# Patient Record
Sex: Female | Born: 1990
Health system: Southern US, Community
[De-identification: ages and names within clinical notes are randomized; demographics above are authoritative.]

## PROBLEM LIST (undated history)

## (undated) ENCOUNTER — Inpatient Hospital Stay: Payer: Self-pay

## (undated) DIAGNOSIS — R519 Headache, unspecified: Secondary | ICD-10-CM

## (undated) DIAGNOSIS — K219 Gastro-esophageal reflux disease without esophagitis: Secondary | ICD-10-CM

## (undated) DIAGNOSIS — F319 Bipolar disorder, unspecified: Secondary | ICD-10-CM

## (undated) DIAGNOSIS — F419 Anxiety disorder, unspecified: Secondary | ICD-10-CM

## (undated) DIAGNOSIS — R51 Headache: Secondary | ICD-10-CM

## (undated) DIAGNOSIS — D649 Anemia, unspecified: Secondary | ICD-10-CM

## (undated) HISTORY — PX: APPENDECTOMY: SHX54

## (undated) HISTORY — PX: TUBAL LIGATION: SHX77

## (undated) HISTORY — PX: WISDOM TOOTH EXTRACTION: SHX21

## (undated) HISTORY — PX: ABDOMINAL HYSTERECTOMY: SHX81

---

## 2010-07-08 HISTORY — PX: LAPAROSCOPIC APPENDECTOMY: SUR753

## 2011-06-11 ENCOUNTER — Ambulatory Visit: Payer: Self-pay | Admitting: Surgery

## 2011-07-09 HISTORY — PX: OTHER SURGICAL HISTORY: SHX169

## 2011-10-13 ENCOUNTER — Emergency Department: Payer: Self-pay | Admitting: Emergency Medicine

## 2011-10-14 LAB — URINALYSIS, COMPLETE
Bilirubin,UR: NEGATIVE
Leukocyte Esterase: NEGATIVE
Nitrite: NEGATIVE
Ph: 6 (ref 4.5–8.0)
RBC,UR: 1 /HPF (ref 0–5)
Squamous Epithelial: 1

## 2012-12-07 ENCOUNTER — Emergency Department: Payer: Self-pay | Admitting: Emergency Medicine

## 2012-12-07 LAB — URINALYSIS, COMPLETE
Bilirubin,UR: NEGATIVE
Glucose,UR: NEGATIVE mg/dL (ref 0–75)
Ph: 6 (ref 4.5–8.0)
RBC,UR: 7 /HPF (ref 0–5)
Specific Gravity: 1.015 (ref 1.003–1.030)
Squamous Epithelial: 3
WBC UR: 3 /HPF (ref 0–5)

## 2012-12-07 LAB — COMPREHENSIVE METABOLIC PANEL
Alkaline Phosphatase: 66 U/L (ref 50–136)
Anion Gap: 7 (ref 7–16)
Chloride: 109 mmol/L — ABNORMAL HIGH (ref 98–107)
Co2: 24 mmol/L (ref 21–32)
EGFR (Non-African Amer.): >60
Glucose: 88 mg/dL (ref 65–99)
SGOT(AST): 16 U/L (ref 15–37)
SGPT (ALT): 19 U/L (ref 12–78)

## 2012-12-07 LAB — CBC
HCT: 38.5 % (ref 35.0–47.0)
HGB: 13 g/dL (ref 12.0–16.0)
MCH: 31.2 pg (ref 26.0–34.0)
MCV: 92 fL (ref 80–100)
RDW: 12.8 % (ref 11.5–14.5)
WBC: 10.8 10*3/uL (ref 3.6–11.0)

## 2012-12-07 LAB — LIPASE, BLOOD: Lipase: 66 U/L — ABNORMAL LOW (ref 73–393)

## 2016-04-17 LAB — OB RESULTS CONSOLE RUBELLA ANTIBODY, IGM: Rubella: IMMUNE

## 2016-04-17 LAB — OB RESULTS CONSOLE GC/CHLAMYDIA
Chlamydia: NEGATIVE
GC PROBE AMP, GENITAL: NEGATIVE

## 2016-04-17 LAB — OB RESULTS CONSOLE VARICELLA ZOSTER ANTIBODY, IGG: VARICELLA IGG: IMMUNE

## 2016-04-17 LAB — OB RESULTS CONSOLE HIV ANTIBODY (ROUTINE TESTING): HIV: NONREACTIVE

## 2016-04-17 LAB — OB RESULTS CONSOLE HEPATITIS B SURFACE ANTIGEN: Hepatitis B Surface Ag: NEGATIVE

## 2016-05-09 ENCOUNTER — Encounter: Payer: Self-pay | Admitting: *Deleted

## 2016-05-09 ENCOUNTER — Ambulatory Visit
Admission: RE | Admit: 2016-05-09 | Discharge: 2016-05-09 | Disposition: A | Discharge status: Home / Self Care | Payer: Medicaid Other | Source: Ambulatory Visit | Attending: Obstetrics and Gynecology | Admitting: Obstetrics and Gynecology

## 2016-05-09 VITALS — BP 105/57 | HR 88 | Temp 98.5°F | Wt 121.6 lb

## 2016-05-09 DIAGNOSIS — O09891 Supervision of other high risk pregnancies, first trimester: Secondary | ICD-10-CM

## 2016-05-09 NOTE — Progress Notes (Signed)
Referring physician:  [facility] 30 minute consultation  Adrienne West was referred to [facility] to discuss any possible risks to her fetus as a result of exposure to Lamictal.  This letter is a summary of our discussion and can be used as a reference as questions arise.  Prior to reviewing the specific exposures, we reviewed the general principles of teratogenic agents (substances that cause birth defects).  First, every pregnancy carries the risk for major or minor malformations of approximately 2-3%.  To show that an agent (exposure) is teratogenic, the rate of abnormalities for exposed pregnancies must be greater than that expected due to the background risk.  The dosage and timing of an exposure are also very important, with the first trimester of pregnancy often being the most critical.  Adrienne West has been prescribed Lamictal for bipolar disorder.  She stopped the medication two weeks prior to her last period (stopped 7/16, LMP 8/6) and has not felt the need to resume the medication.   She feels like she is doing well without medication and has a good support system with her family and therapist right now.  She is aware that if that changes, she can speak with her doctor about medication options during pregnancy.  We reviewed known information about this medication. Lamictal is thought to be a fairly safe medication to use in pregnancy.  While one pregnancy registry showed an increased in oral clefts following Lamictal use in pregnancy, this finding has not been confirmed in other, more recent studies.  There have been no other associations with specific birth defects, cognitive delays or other health concerns with this medication. If she is concerned about the risk for cleft lip and palate, then detailed ultrasound may be helpful to assess the structure of the nose/lips.  We reiterated that she stopped the medication approximately 4 weeks prior to conception, and  thus 6 weeks prior to implantation.  Given the half life of 29 hours for Lamictal, it is expected that the amount still in her system after 4-6 weeks would be incredibly small.  As with all medications prescribed during pregnancy, the risk to the fetus must be weighed against the benefit to the mother.  Therefore, it is important to for patients to consult their obstetrician and mental health provider with any concerns regarding stopping, changing doses, or continuing medication use.   During our session, we had the opportunity to obtain a detailed pregnancy and family history.  Adrienne West reported no pregnancy complications.  Prior to her missed period, she did smoke marijuana.  She was also smoking 20-25 cigarettes per day before pregnancy. She has slowly cut back and this week stopped smoking altogether.  We encouraged her to continue to avoid smoking, as it is known to be associated with low birth weight, preterm delivery and poor pregnancy outcomes. Lastly, Adrienne West is having dental issues which have or will require xrays, fillings and repair of broken teeth.  She has this scheduled in the second trimester, as recommended by her dentist.  In the family history, several family members were also stated to have mental health conditions including her mother and sister with bipolar disorder, a paternal uncle and his son who passed away due to suicide, and the father of the baby's mother with bipolar.  We reviewed that while we are not aware of the specific genetic factors which predispose to mental health conditions, there are clearly strong genetic influences in some families.  We  therefore encouraged Adrienne West to be mindful of this history and symptoms in her children.  Also in the family, Adrienne West reported one paternal first cousin with developmental differences and the father of the baby has a maternal half brother with autism.  Developmental delays and autism may both be present as isolated conditions or  can be part of many genetic syndromes.  In addition, both may be realted to environmental or non-genetic causes.  Without additional medical information, we cannot accurately assess the chance for recurrence for either of these conditions.  If more is learned, we are happy to discuss this further.  Lastly, the father of the baby's half sister has a son with craniosynostosis, or premature fusion of the bones in the skull.  Following surgery, he is doing well.  He has no developmental differences and no other known birth defects or physical differences to suggest a genetic syndrome.  Craniosynostosis can be an isolated finding with low chance for recurrence, or can be part of a genetic syndrome when other differences are present.  If Adrienne West finds out more about his condition, we are happy to review medical records or speak further. The remainder of the family history is unremarkable for recurrent pregnancy loss, deafness, blindness, consanguinity or known genetic conditions.  We reviewed her prior screening for chromosome conditions in this pregnancy.  [facility] which was normal for trisomy 18 and Down syndrome.  She declined SMA and cystic fibrosis in this pregnancy.    We appreciate being involved in the case of this patient are happy to speak with Adrienne West further should she have any questions.  We may be reached at [phone].     Cherly Andersoneborah F. Jermey Closs, MS, CGC

## 2016-05-15 NOTE — Progress Notes (Signed)
Deborah Wells, MS, CGC performed an integral service incident to the physician's initial service.  I was physically present in the clinical area and was immediately available to render assistance.   Cherish Runde C Mahamadou Weltz  

## 2016-07-08 NOTE — L&D Delivery Note (Signed)
Delivery Note At 3:21 AM a viable female was delivered via Vaginal, Spontaneous Delivery (Presentation: OA).  APGAR: 9/9; weight pending.   Placenta status: spontaneous, intact.  Cord: 3VC witout complications.  Cord pH: N/A  Anesthesia: none Episiotomy:  none Lacerations:  none Suture Repair: N/A Est. Blood Loss (mL):  300mL  Mom to postpartum.  Baby to Couplet care / Skin to Skin.  Vena Austriandreas Keene Gilkey 11/09/2016, 3:34 AM

## 2016-08-06 ENCOUNTER — Encounter: Payer: Self-pay | Admitting: *Deleted

## 2016-08-06 DIAGNOSIS — Z3A26 26 weeks gestation of pregnancy: Secondary | ICD-10-CM | POA: Insufficient documentation

## 2016-08-06 DIAGNOSIS — R197 Diarrhea, unspecified: Secondary | ICD-10-CM | POA: Diagnosis not present

## 2016-08-06 DIAGNOSIS — O26892 Other specified pregnancy related conditions, second trimester: Secondary | ICD-10-CM | POA: Diagnosis not present

## 2016-08-06 DIAGNOSIS — R51 Headache: Secondary | ICD-10-CM | POA: Insufficient documentation

## 2016-08-06 DIAGNOSIS — R0789 Other chest pain: Secondary | ICD-10-CM | POA: Diagnosis not present

## 2016-08-06 DIAGNOSIS — O99332 Smoking (tobacco) complicating pregnancy, second trimester: Secondary | ICD-10-CM | POA: Insufficient documentation

## 2016-08-06 DIAGNOSIS — Z79899 Other long term (current) drug therapy: Secondary | ICD-10-CM | POA: Insufficient documentation

## 2016-08-06 DIAGNOSIS — F17200 Nicotine dependence, unspecified, uncomplicated: Secondary | ICD-10-CM | POA: Diagnosis not present

## 2016-08-06 DIAGNOSIS — R110 Nausea: Secondary | ICD-10-CM | POA: Diagnosis not present

## 2016-08-06 LAB — CBC
HEMATOCRIT: 31.1 % — AB (ref 35.0–47.0)
HEMOGLOBIN: 10.9 g/dL — AB (ref 12.0–16.0)
MCH: 33.5 pg (ref 26.0–34.0)
MCHC: 35.2 g/dL (ref 32.0–36.0)
MCV: 95.2 fL (ref 80.0–100.0)
Platelets: 202 10*3/uL (ref 150–440)
RBC: 3.27 MIL/uL — ABNORMAL LOW (ref 3.80–5.20)
RDW: 12.8 % (ref 11.5–14.5)
WBC: 14.7 10*3/uL — AB (ref 3.6–11.0)

## 2016-08-06 LAB — BASIC METABOLIC PANEL
Anion gap: 6 (ref 5–15)
BUN: 5 mg/dL — AB (ref 6–20)
CHLORIDE: 110 mmol/L (ref 101–111)
CO2: 22 mmol/L (ref 22–32)
Calcium: 8.3 mg/dL — ABNORMAL LOW (ref 8.9–10.3)
Creatinine, Ser: 0.55 mg/dL (ref 0.44–1.00)
GFR calc Af Amer: >60 mL/min (ref >60)
Glucose, Bld: 103 mg/dL — ABNORMAL HIGH (ref 65–99)
POTASSIUM: 3.2 mmol/L — AB (ref 3.5–5.1)
SODIUM: 138 mmol/L (ref 135–145)

## 2016-08-06 LAB — TROPONIN I: Troponin I: <0.03 ng/mL (ref <0.03)

## 2016-08-06 NOTE — ED Triage Notes (Signed)
Pt reports chest pain since noon today.  Pt reports pain in center of chest.  Pain radiates into back and ribs.  Pt is [redacted] weeks pregnant.  [facility].  No abd pain.  No vag bleeding.  Pt saw dr yesterday for check up.  Pt also reports chills and cough.  Pt alert.

## 2016-08-07 ENCOUNTER — Emergency Department
Admission: EM | Admit: 2016-08-07 | Discharge: 2016-08-07 | Disposition: A | Discharge status: Home / Self Care | Payer: Medicaid Other | Attending: Emergency Medicine | Admitting: Emergency Medicine

## 2016-08-07 ENCOUNTER — Emergency Department: Payer: Medicaid Other

## 2016-08-07 ENCOUNTER — Encounter: Payer: Self-pay | Admitting: Emergency Medicine

## 2016-08-07 DIAGNOSIS — R079 Chest pain, unspecified: Secondary | ICD-10-CM

## 2016-08-07 HISTORY — DX: Bipolar disorder, unspecified: F31.9

## 2016-08-07 LAB — INFLUENZA PANEL BY PCR (TYPE A & B)
INFLAPCR: NEGATIVE
INFLBPCR: NEGATIVE

## 2016-08-07 MED ORDER — ACETAMINOPHEN 325 MG PO TABS
650.0000 mg | ORAL_TABLET | Freq: Once | ORAL | Status: AC
  Administered 2016-08-07: 650 mg via ORAL
  Filled 2016-08-07: qty 2

## 2016-08-07 MED ORDER — ALUM & MAG HYDROXIDE-SIMETH 200-200-20 MG/5ML PO SUSP
30.0000 mL | Freq: Once | ORAL | Status: AC
  Administered 2016-08-07: 30 mL via ORAL
  Filled 2016-08-07: qty 30

## 2016-08-07 NOTE — ED Notes (Addendum)
Pt. Verbalizes understanding of d/c instructions and follow-up. VS stable and pain controlled per pt.  Pt. In NAD at time of d/c and denies further concerns regarding this visit. Pt. Stable at the time of departure from the unit, departing unit by the safest and most appropriate manner per that pt condition and limitations. Pt advised to return to the ED at any time for emergent concerns, or for new/worsening symptoms.   

## 2016-08-07 NOTE — Discharge Instructions (Signed)
Please continue taking Tylenol for ear pain. He may take some Zantac at home as well. Please follow back up with Dr. Jean RosenthalJackson for further evaluation.

## 2016-08-07 NOTE — ED Provider Notes (Signed)
[facility] Emergency Department Provider Note   ____________________________________________   First MD Initiated Contact with Patient 08/07/16 0124     (approximate)  I have reviewed the triage vital signs and the nursing notes.   HISTORY  Chief Complaint Chest Pain    HPI Adrienne West is a 26 y.o. female who comes into the hospital today with some chest pain. The patient reports that started getting some flulike symptoms with fever, chills, headache and chest congestion. She reports that it started on Saturday and part of Sunday. She reports that she did have some nausea and diarrhea with no vomiting. The symptoms became better and the patient saw her primary care doctor on Monday. She had a prenatal appointment and was told everything was fine. Around noon and the patient's symptoms returned and she also had some chest pain. She reports it worsened throughout the day. It's the middle of her chest and sharp pain and she says it goes into her back. The patient took Tylenol but it did not help. The patient denies any shortness of breath or pain when she takes a deep breath. She rates her pain a 5 out of 10 in intensity. The patient is here today for evaluation of the symptoms.   Past Medical History:  Diagnosis Date  . Bipolar disorder [facility](HCC)     Patient Active Problem List   Diagnosis Date Noted  . Medication exposure during first trimester of pregnancy     Past Surgical History:  Procedure Laterality Date  . apendectomy N/A 2012    Prior to Admission medications   Medication Sig Start Date End Date Taking? Authorizing Provider  Prenatal Vit-Fe Fumarate-FA (MULTIVITAMIN-PRENATAL) 27-0.8 MG TABS tablet Take 1 tablet by mouth daily at 12 noon.   Yes Historical Provider, MD    Allergies Patient has no known allergies.  No family history on file.  Social History Social History  Substance Use Topics  . Smoking status: Current Every Day  Smoker    Packs/day: 1.00  . Smokeless tobacco: Former NeurosurgeonUser     Comment: Quit 3 days ago  . Alcohol use No    Review of Systems Constitutional:  fever/chills Eyes: No visual changes. ENT: No sore throat. Cardiovascular: chest pain. Respiratory: cough Gastrointestinal: diarrhea and nausea with No abdominal pain, no vomiting.   Genitourinary: Negative for dysuria. Musculoskeletal: Negative for back pain. Skin: Negative for rash. Neurological: Negative for headaches, focal weakness or numbness.  10-point ROS otherwise negative.  ____________________________________________   PHYSICAL EXAM:  VITAL SIGNS: ED Triage Vitals  Enc Vitals Group     BP 08/06/16 2154 (!) 113/59     Pulse Rate 08/06/16 2154 100     Resp 08/06/16 2154 20     Temp 08/06/16 2154 98.6 F (37 C)     Temp Source 08/06/16 2154 Oral     SpO2 08/06/16 2154 99 %     Weight 08/06/16 2155 130 lb (59 kg)     Height 08/06/16 2155 5\' 2"  (1.575 m)     Head Circumference --      Peak Flow --      Pain Score 08/06/16 2156 3     Pain Loc --      Pain Edu? --      Excl. in GC? --     Constitutional: Alert and oriented. Well appearing and in mild distress. Eyes: Conjunctivae are normal. PERRL. EOMI. Head: Atraumatic. Nose: No congestion/rhinnorhea. Mouth/Throat: Mucous membranes are moist.  Oropharynx non-erythematous. Cardiovascular: Normal rate, regular rhythm. Grossly normal heart sounds.  Good peripheral circulation. Respiratory: Normal respiratory effort.  No retractions. Lungs CTAB. Gastrointestinal: Soft and nontender. Gravid uterus. Positive bowel sounds Musculoskeletal: No lower extremity tenderness nor edema.  Neurologic:  Normal speech and language.  Skin:  Skin is warm, dry and intact.  Psychiatric: Mood and affect are normal.   ____________________________________________   LABS (all labs ordered are listed, but only abnormal results are displayed)  Labs Reviewed  BASIC METABOLIC PANEL -  Abnormal; Notable for the following:       Result Value   Potassium 3.2 (*)    Glucose, Bld 103 (*)    BUN 5 (*)    Calcium 8.3 (*)    All other components within normal limits  CBC - Abnormal; Notable for the following:    WBC 14.7 (*)    RBC 3.27 (*)    Hemoglobin 10.9 (*)    HCT 31.1 (*)    All other components within normal limits  TROPONIN I  INFLUENZA PANEL BY PCR (TYPE A & B)   ____________________________________________  EKG  ED ECG REPORT I, Rebecka Apley, the attending physician, personally viewed and interpreted this ECG.   Date: 08/06/2016  EKG Time: 2201  Rate: 109  Rhythm: sinus tachycardia  Axis: normal  Intervals:none  ST&T Change: none  ____________________________________________  RADIOLOGY  CXR ____________________________________________   PROCEDURES  Procedure(s) performed: None  Procedures  Critical Care performed: No  ____________________________________________   INITIAL IMPRESSION / ASSESSMENT AND PLAN / ED COURSE  Pertinent labs & imaging results that were available during my care of the patient were reviewed by me and considered in my medical decision making (see chart for details).  This is a 26 year old female who comes into the hospital today with some chest pain. She did have some flulike symptoms but they all did resolve. The patient had some pain that went into her back but she was not having any pleuritic pain. I did perform a chest x-ray and I did check some blood work. The patient's blood work is unremarkable. I gave the patient some Maalox as well as some Tylenol and she reports that her pain improved. At this time I don't feel that more studies need to be done. The patient feels improved and her blood work is unremarkable. I will have the patient follow up with her primary care physician. If any of the patient's symptoms return or worsen she should come back to the hospital for further evaluation.  Clinical Course  as of Aug 07 624  Wed Aug 07, 2016  0240 No acute cardiopulmonary process. DG Chest 2 View [AW]    Clinical Course User Index [AW] Rebecka Apley, MD     ____________________________________________   FINAL CLINICAL IMPRESSION(S) / ED DIAGNOSES  Final diagnoses:  Chest pain, unspecified type      NEW MEDICATIONS STARTED DURING THIS VISIT:  Discharge Medication List as of 08/07/2016  3:31 AM       Note:  This document was prepared using Dragon voice recognition software and may include unintentional dictation errors.    Rebecka Apley, MD 08/07/16 [phone]

## 2016-09-04 ENCOUNTER — Ambulatory Visit (INDEPENDENT_AMBULATORY_CARE_PROVIDER_SITE_OTHER): Payer: Medicaid Other | Admitting: Obstetrics and Gynecology

## 2016-09-04 ENCOUNTER — Encounter: Payer: Self-pay | Admitting: Obstetrics and Gynecology

## 2016-09-04 VITALS — BP 112/70 | Wt 134.0 lb

## 2016-09-04 DIAGNOSIS — Z3A30 30 weeks gestation of pregnancy: Secondary | ICD-10-CM

## 2016-09-04 DIAGNOSIS — Z3493 Encounter for supervision of normal pregnancy, unspecified, third trimester: Secondary | ICD-10-CM

## 2016-09-04 DIAGNOSIS — Z23 Encounter for immunization: Secondary | ICD-10-CM

## 2016-09-04 DIAGNOSIS — Z3403 Encounter for supervision of normal first pregnancy, third trimester: Secondary | ICD-10-CM | POA: Insufficient documentation

## 2016-09-04 NOTE — Progress Notes (Signed)
Pt has had a lot of HA's in the past couple weeks and seeing "flashes of light" with them.

## 2016-09-04 NOTE — Progress Notes (Signed)
TDaP today. Influenza vaccine last time.

## 2016-09-20 ENCOUNTER — Ambulatory Visit (INDEPENDENT_AMBULATORY_CARE_PROVIDER_SITE_OTHER): Payer: Medicaid Other | Admitting: Obstetrics and Gynecology

## 2016-09-20 VITALS — BP 100/60 | Wt 130.0 lb

## 2016-09-20 DIAGNOSIS — Z3A33 33 weeks gestation of pregnancy: Secondary | ICD-10-CM

## 2016-09-20 DIAGNOSIS — Z3403 Encounter for supervision of normal first pregnancy, third trimester: Secondary | ICD-10-CM

## 2016-09-20 NOTE — Progress Notes (Signed)
No vb. No lof. Desires cervical check prior to traveling.   Recent diagnosis of bacterial sinusitis. Taking amox.

## 2016-10-08 ENCOUNTER — Encounter: Payer: Self-pay | Admitting: Advanced Practice Midwife

## 2016-10-08 ENCOUNTER — Ambulatory Visit (INDEPENDENT_AMBULATORY_CARE_PROVIDER_SITE_OTHER): Payer: Medicaid Other | Admitting: Advanced Practice Midwife

## 2016-10-08 VITALS — BP 110/72 | Wt 138.0 lb

## 2016-10-08 DIAGNOSIS — Z3A35 35 weeks gestation of pregnancy: Secondary | ICD-10-CM

## 2016-10-08 NOTE — Progress Notes (Signed)
Doing well. Pt plans to formula feed due to her mental health. She will see her psychiatrist and get back on her medications as soon as possible after delivery. Encouraged sleep and support also for postpartum well being.

## 2016-10-16 ENCOUNTER — Ambulatory Visit (INDEPENDENT_AMBULATORY_CARE_PROVIDER_SITE_OTHER): Payer: Medicaid Other | Admitting: Advanced Practice Midwife

## 2016-10-16 VITALS — BP 100/60 | Wt 135.0 lb

## 2016-10-16 DIAGNOSIS — Z113 Encounter for screening for infections with a predominantly sexual mode of transmission: Secondary | ICD-10-CM

## 2016-10-16 DIAGNOSIS — Z3685 Encounter for antenatal screening for Streptococcus B: Secondary | ICD-10-CM

## 2016-10-16 DIAGNOSIS — Z3A36 36 weeks gestation of pregnancy: Secondary | ICD-10-CM

## 2016-10-16 NOTE — Progress Notes (Signed)
Doing well today. Reviewed labor precautions. GBS/aptima today.

## 2016-10-18 LAB — STREP GP B NAA: Strep Gp B NAA: NEGATIVE

## 2016-10-19 LAB — GC/CHLAMYDIA PROBE AMP
CHLAMYDIA, DNA PROBE: NEGATIVE
NEISSERIA GONORRHOEAE BY PCR: NEGATIVE

## 2016-10-23 ENCOUNTER — Ambulatory Visit (INDEPENDENT_AMBULATORY_CARE_PROVIDER_SITE_OTHER): Payer: Medicaid Other | Admitting: Obstetrics and Gynecology

## 2016-10-23 VITALS — BP 98/52 | Wt 140.0 lb

## 2016-10-23 DIAGNOSIS — Z3483 Encounter for supervision of other normal pregnancy, third trimester: Secondary | ICD-10-CM

## 2016-10-23 DIAGNOSIS — Z3A37 37 weeks gestation of pregnancy: Secondary | ICD-10-CM

## 2016-10-28 ENCOUNTER — Inpatient Hospital Stay
Admission: EM | Admit: 2016-10-28 | Discharge: 2016-10-29 | DRG: 780 | Disposition: A | Discharge status: Home / Self Care | Payer: Medicaid Other | Attending: Obstetrics and Gynecology | Admitting: Obstetrics and Gynecology

## 2016-10-28 DIAGNOSIS — Z79899 Other long term (current) drug therapy: Secondary | ICD-10-CM

## 2016-10-28 DIAGNOSIS — O99334 Smoking (tobacco) complicating childbirth: Secondary | ICD-10-CM | POA: Diagnosis present

## 2016-10-28 DIAGNOSIS — Z823 Family history of stroke: Secondary | ICD-10-CM

## 2016-10-28 DIAGNOSIS — F319 Bipolar disorder, unspecified: Secondary | ICD-10-CM | POA: Diagnosis present

## 2016-10-28 DIAGNOSIS — Z8249 Family history of ischemic heart disease and other diseases of the circulatory system: Secondary | ICD-10-CM

## 2016-10-28 DIAGNOSIS — Z9049 Acquired absence of other specified parts of digestive tract: Secondary | ICD-10-CM

## 2016-10-28 DIAGNOSIS — O99344 Other mental disorders complicating childbirth: Secondary | ICD-10-CM | POA: Diagnosis present

## 2016-10-28 DIAGNOSIS — O471 False labor at or after 37 completed weeks of gestation: Principal | ICD-10-CM | POA: Diagnosis present

## 2016-10-28 DIAGNOSIS — Z803 Family history of malignant neoplasm of breast: Secondary | ICD-10-CM

## 2016-10-28 DIAGNOSIS — O9902 Anemia complicating childbirth: Secondary | ICD-10-CM | POA: Diagnosis present

## 2016-10-28 DIAGNOSIS — Z825 Family history of asthma and other chronic lower respiratory diseases: Secondary | ICD-10-CM

## 2016-10-28 DIAGNOSIS — Z833 Family history of diabetes mellitus: Secondary | ICD-10-CM

## 2016-10-28 DIAGNOSIS — Z3A38 38 weeks gestation of pregnancy: Secondary | ICD-10-CM

## 2016-10-28 DIAGNOSIS — Z818 Family history of other mental and behavioral disorders: Secondary | ICD-10-CM

## 2016-10-28 DIAGNOSIS — F17210 Nicotine dependence, cigarettes, uncomplicated: Secondary | ICD-10-CM | POA: Diagnosis present

## 2016-10-28 HISTORY — DX: Anxiety disorder, unspecified: F41.9

## 2016-10-29 ENCOUNTER — Encounter: Payer: Self-pay | Admitting: Certified Nurse Midwife

## 2016-10-29 DIAGNOSIS — Z3A38 38 weeks gestation of pregnancy: Secondary | ICD-10-CM

## 2016-10-29 DIAGNOSIS — Z818 Family history of other mental and behavioral disorders: Secondary | ICD-10-CM | POA: Diagnosis not present

## 2016-10-29 DIAGNOSIS — O9902 Anemia complicating childbirth: Secondary | ICD-10-CM | POA: Diagnosis present

## 2016-10-29 DIAGNOSIS — O99334 Smoking (tobacco) complicating childbirth: Secondary | ICD-10-CM | POA: Diagnosis not present

## 2016-10-29 DIAGNOSIS — Z79899 Other long term (current) drug therapy: Secondary | ICD-10-CM | POA: Diagnosis not present

## 2016-10-29 DIAGNOSIS — O99344 Other mental disorders complicating childbirth: Secondary | ICD-10-CM | POA: Diagnosis present

## 2016-10-29 DIAGNOSIS — Z803 Family history of malignant neoplasm of breast: Secondary | ICD-10-CM | POA: Diagnosis not present

## 2016-10-29 DIAGNOSIS — F17210 Nicotine dependence, cigarettes, uncomplicated: Secondary | ICD-10-CM | POA: Diagnosis present

## 2016-10-29 DIAGNOSIS — Z833 Family history of diabetes mellitus: Secondary | ICD-10-CM | POA: Diagnosis not present

## 2016-10-29 DIAGNOSIS — Z8249 Family history of ischemic heart disease and other diseases of the circulatory system: Secondary | ICD-10-CM | POA: Diagnosis not present

## 2016-10-29 DIAGNOSIS — Z825 Family history of asthma and other chronic lower respiratory diseases: Secondary | ICD-10-CM | POA: Diagnosis not present

## 2016-10-29 DIAGNOSIS — O471 False labor at or after 37 completed weeks of gestation: Secondary | ICD-10-CM | POA: Diagnosis not present

## 2016-10-29 DIAGNOSIS — Z9049 Acquired absence of other specified parts of digestive tract: Secondary | ICD-10-CM | POA: Diagnosis not present

## 2016-10-29 DIAGNOSIS — F319 Bipolar disorder, unspecified: Secondary | ICD-10-CM | POA: Diagnosis present

## 2016-10-29 DIAGNOSIS — Z823 Family history of stroke: Secondary | ICD-10-CM | POA: Diagnosis not present

## 2016-10-29 LAB — CBC
HEMATOCRIT: 33.3 % — AB (ref 35.0–47.0)
HEMOGLOBIN: 11.2 g/dL — AB (ref 12.0–16.0)
MCH: 32.2 pg (ref 26.0–34.0)
MCHC: 33.7 g/dL (ref 32.0–36.0)
MCV: 95.6 fL (ref 80.0–100.0)
Platelets: 266 10*3/uL (ref 150–440)
RBC: 3.48 MIL/uL — ABNORMAL LOW (ref 3.80–5.20)
RDW: 13.2 % (ref 11.5–14.5)
WBC: 24 10*3/uL — ABNORMAL HIGH (ref 3.6–11.0)

## 2016-10-29 LAB — TYPE AND SCREEN
ABO/RH(D): O POS
Antibody Screen: NEGATIVE

## 2016-10-29 MED ORDER — OXYTOCIN 40 UNITS IN LACTATED RINGERS INFUSION - SIMPLE MED: 2.5000 [IU]/h | INTRAVENOUS | Status: DC

## 2016-10-29 MED ORDER — ONDANSETRON HCL 4 MG/2ML IJ SOLN: 4.0000 mg | Freq: Four times a day (QID) | INTRAMUSCULAR | Status: DC | PRN

## 2016-10-29 MED ORDER — LACTATED RINGERS IV SOLN: 500.0000 mL | INTRAVENOUS | Status: DC | PRN

## 2016-10-29 MED ORDER — BUTORPHANOL TARTRATE 1 MG/ML IJ SOLN: 1.0000 mg | INTRAMUSCULAR | Status: DC | PRN

## 2016-10-29 MED ORDER — LIDOCAINE HCL (PF) 1 % IJ SOLN
INTRAMUSCULAR | Status: AC
  Filled 2016-10-29: qty 30

## 2016-10-29 MED ORDER — MISOPROSTOL 200 MCG PO TABS
ORAL_TABLET | ORAL | Status: AC
  Filled 2016-10-29: qty 4

## 2016-10-29 MED ORDER — AMMONIA AROMATIC IN INHA
RESPIRATORY_TRACT | Status: AC
  Filled 2016-10-29: qty 10

## 2016-10-29 MED ORDER — AMMONIA AROMATIC IN INHA: 0.3000 mL | Freq: Once | RESPIRATORY_TRACT | Status: DC | PRN

## 2016-10-29 MED ORDER — OXYTOCIN 10 UNIT/ML IJ SOLN
INTRAMUSCULAR | Status: AC
  Filled 2016-10-29: qty 2

## 2016-10-29 MED ORDER — OXYTOCIN BOLUS FROM INFUSION
500.0000 mL | Freq: Once | INTRAVENOUS | Status: DC
Start: 2016-10-29 — End: 2016-10-29

## 2016-10-29 MED ORDER — MISOPROSTOL 200 MCG PO TABS: 800.0000 ug | ORAL_TABLET | Freq: Once | ORAL | Status: DC | PRN

## 2016-10-29 MED ORDER — LACTATED RINGERS IV SOLN
INTRAVENOUS | Status: DC
  Administered 2016-10-29: 1000 mL via INTRAVENOUS

## 2016-10-29 MED ORDER — LIDOCAINE HCL (PF) 1 % IJ SOLN: 30.0000 mL | INTRAMUSCULAR | Status: DC | PRN

## 2016-10-29 NOTE — H&P (Signed)
OB History & Physical   History of Present Illness:  Chief Complaint:  Complains of onset of regular contractions around 2130 last night. No LOF.  HPI:  Adrienne West is a 26 y.o. G2P1001 female with EDC= 11/08/2016  at [redacted]w[redacted]d dated by a 12 week ultrasound.  Her pregnancy has been complicated by bipolar disorder, fetal exposure to Lamictal early pregnancy, anemia, and tobacco use.. She also was diagnosed with hand, foot and mouth disease at 27 weeks of pregnancy.  Total weight gain this pregnancy is 18#. She also had no prenatal care x 2 months in last trimester. She presented to L&D for evaluation of labor. Her cervix changed from 2.5 to 4cm and she was admitted. +bloody show. Baby active  Pelvis proven to 6# (female infant 2013 at 47 weeks)  Prenatal care site: Prenatal care at [facility]. Desires to bottle feed. Will be resuming Lamictal postpartum. Contraception: vasectomy     Maternal Medical History:   Past Medical History:  Diagnosis Date  . Anxiety   . Bipolar disorder [facility])     Past Surgical History:  Procedure Laterality Date  . aspiration of breast cyst Right 2013  . LAPAROSCOPIC APPENDECTOMY N/A 2012    No Known Allergies  Prior to Admission medications   Medication Sig Start Date End Date Taking? Authorizing Provider  Prenatal Vit-Fe Fumarate-FA (MULTIVITAMIN-PRENATAL) 27-0.8 MG TABS tablet Take 1 tablet by mouth daily at 12 noon.   Yes Historical Provider, MD          Social History: She  reports that she has been smoking Cigarettes.  She has been smoking about 0.75 packs per day. She has quit using smokeless tobacco. She reports that she does not drink alcohol or use drugs.  Family History: family history includes AAA (abdominal aortic aneurysm) in her paternal grandfather; Bipolar disorder in her mother and sister; Breast cancer (age of onset: 18) in her mother; COPD in her maternal grandmother; Diabetes in her maternal grandfather; Endometriosis  in her mother; Hyperlipidemia in her father; Hypertension in her father; Hypothyroidism in her maternal grandmother; Rheum arthritis in her paternal grandmother; Stroke in her father and paternal grandfather.   Review of Systems: Negative x 10 systems reviewed except as noted in the HPI.      Physical Exam:  Vital Signs: BP 102/61   Pulse 81   Temp 98 F (36.7 C) (Oral)   LMP 02/11/2016  General: no acute distress.  HEENT: normocephalic, atraumatic Heart: regular rate & rhythm.  No murmurs/rubs/gallops Lungs: clear to auscultation bilaterally Abdomen: soft, gravid, non-tender;  EFW: 6 1/2#/ cephalic on Leopold's Pelvic:   External: Normal external female genitalia  Cervix: Dilation: 4 / Effacement (%): 60 / Station: -1   Extremities: non-tender, symmetric, no edema bilaterally.  DTRs: +1  Neurologic: Alert & oriented x 3.    Pertinent Results:  Prenatal Labs: Blood type/Rh O positive  Antibody screen negative  Rubella Varicella Immune immune  RPR Non reactive  HBsAg negative  HIV Non reactive  GC negative  Chlamydia negative  Genetic screening Negative First   1 hour GTT 96  3 hour GTT NA  GBS negative on 10/16/2016   Baseline FHR:  125-130 baseline with accelerations to 150s, moderate to marked variability Toco: contractions 1-5 min apart  Bedside Ultrasound:    Presentation: vertex  Placental Location: anterior  Assessment:  Adrienne West is a 26 y.o. G51P1001 female at [redacted]w[redacted]d in early labor FWB: Cat 1 tracing  Plan:  1. Admit to Labor & Delivery   2. CBC, T&S, Clrs, IVF 3. GBS negative.   4. Consents obtained. 5. Expectant management for now 6. Epidural when appropriate 7. O POS/ RI/ VI/ GBS negative 8. Bottle/?vasectomy 9. TDAP given 09/04/2016  Farrel Conners  10/29/2016 6:51 AM

## 2016-10-29 NOTE — Discharge Summary (Signed)
Physician Discharge Summary  Patient ID: Adrienne West MRN: [id number] DOB/AGE: 10/24/1990 25 y.o.  Admit date: 10/28/2016 Admitting provider: Nadara Mustard, MD Discharge date: 10/29/2016   Admission Diagnoses: early labor  Discharge Diagnoses: false labor  History of Present Illness: The patient is a 26 y.o. female G2P1001 at [redacted]w[redacted]d who presents for regular uterine contractions. She progressed to 4 cm. Based on her initial presentation she was diagnosed as being in active labor.  However, her contractions began to space out.  Hospital Course: She was admitted and observed for spontaneous cervical change. She was dilated to 4cm and stayed at that level for 6.5 hours.  She was rechecked and was 4 cm (at best) with a thick cervix.  She had reassuring fetal tracing and normal vitals.  Her WBC count was 24 at admission. However, no basis for augmentation could otherwise be found. A mutual decision was made to discharge the patient home to await further labor or spontaneous rupture of membranes.  Strict return precautions were given.     Past Medical History:  Diagnosis Date  . Anxiety   . Bipolar disorder [facility])     Past Surgical History:  Procedure Laterality Date  . aspiration of breast cyst Right 2013  . LAPAROSCOPIC APPENDECTOMY N/A 2012    No current facility-administered medications on file prior to encounter.    Current Outpatient Prescriptions on File Prior to Encounter  Medication Sig Dispense Refill  . Prenatal Vit-Fe Fumarate-FA (MULTIVITAMIN-PRENATAL) 27-0.8 MG TABS tablet Take 1 tablet by mouth daily at 12 noon.      No Known Allergies  Social History   Social History  . Marital status: Married    Spouse name: N/A  . Number of children: N/A  . Years of education: N/A   Occupational History  . Not on file.   Social History Main Topics  . Smoking status: Current Every Day Smoker    Packs/day: 0.75    Types: Cigarettes  . Smokeless tobacco: Former Neurosurgeon    Comment: Quit 3 days ago  . Alcohol use No  . Drug use: No  . Sexual activity: Yes   Other Topics Concern  . Not on file   Social History Narrative  . No narrative on file    Physical Exam: BP (!) 106/58   Pulse 64   Temp 98.2 F (36.8 C) (Oral)   Resp 16   Ht  (1.575 m)   Wt 140 lb (63.5 kg)   LMP 02/11/2016   BMI 25.61 kg/m   Physical Exam  Constitutional: She is oriented to person, place, and time. She appears well-developed and well-nourished. No distress.  HENT:  Head: Normocephalic and atraumatic.  Eyes: Conjunctivae are normal.  Neck: Normal range of motion. Neck supple. No thyromegaly present.  Cardiovascular: Normal rate, regular rhythm and normal heart sounds.  Exam reveals no gallop and no friction rub.   No murmur heard. Pulmonary/Chest: Effort normal and breath sounds normal. She has no wheezes.  Abdominal: Soft. She exhibits no distension. There is no tenderness. There is no guarding. Hernia confirmed negative in the right inguinal area and confirmed negative in the left inguinal area.  Gravid, nontender  Genitourinary: Pelvic exam was performed with patient supine. There is no rash, tenderness or lesion on the right labia. There is no rash, tenderness or lesion on the left labia.  Genitourinary Comments: Cervix: 4/30/-1, membranes intact, scant bloody show.  Musculoskeletal: Normal range of motion.  Lymphadenopathy:  Right: No inguinal adenopathy present.       Left: No inguinal adenopathy present.  Neurological: She is alert and oriented to person, place, and time.  Skin: Skin is warm and dry. No rash noted.  Psychiatric: She has a normal mood and affect. Her behavior is normal.     Consults: None  Significant Findings/ Diagnostic Studies:  CBC    Component Value Date/Time   WBC 24.0 (H) 10/29/2016 0413   RBC 3.48 (L) 10/29/2016 0413   HGB 11.2 (L) 10/29/2016 0413   HGB 13.0 12/07/2012 2047   HCT 33.3 (L) 10/29/2016 0413   HCT 38.5  12/07/2012 2047   PLT 266 10/29/2016 0413   PLT 308 12/07/2012 2047   MCV 95.6 10/29/2016 0413   MCV 92 12/07/2012 2047   MCH 32.2 10/29/2016 0413   MCHC 33.7 10/29/2016 0413   RDW 13.2 10/29/2016 0413   RDW 12.8 12/07/2012 2047    O POS   GBS: Negative (04/11 1438)   Procedures: NST Baseline FHR: 125 beats/min Variability: moderate Accelerations: present Decelerations: absent Tocometry: irregular   Interpretation:  INDICATIONS: early labor RESULTS:  A NST procedure was performed with FHR monitoring and a normal baseline established, appropriate time of 20-40 minutes of evaluation, and accels >2 seen w 15x15 characteristics.  Results show a REACTIVE NST.  Category 1.   Discharge Condition: stable  Disposition: 01-Home or Self Care  Diet: Regular diet  Discharge Activity: Activity as tolerated   Allergies as of 10/29/2016   No Known Allergies     Medication List    TAKE these medications   multivitamin-prenatal 27-0.8 MG Tabs tablet Take 1 tablet by mouth daily at 12 noon.      Follow-up Information    [facility] Follow up in 1 day(s).   Why:  Keep previously scheduled prenatal appointment Contact information: [address] [place] [address] [phone]          Total time spent taking care of this patient: 30 minutes  Signed: Thomasene Mohair, MD  10/29/2016, 10:09 AM

## 2016-10-30 ENCOUNTER — Ambulatory Visit (INDEPENDENT_AMBULATORY_CARE_PROVIDER_SITE_OTHER): Payer: Medicaid Other | Admitting: Advanced Practice Midwife

## 2016-10-30 VITALS — BP 90/60 | Wt 140.0 lb

## 2016-10-30 DIAGNOSIS — Z3A38 38 weeks gestation of pregnancy: Secondary | ICD-10-CM

## 2016-10-30 LAB — RPR: RPR Ser Ql: NONREACTIVE

## 2016-10-30 NOTE — Patient Instructions (Signed)

## 2016-10-30 NOTE — Progress Notes (Signed)
Doing well. Was seen in hospital on Tuesday and dilated from 2 to 4 but then stopped so made the decision to go home. Has only had one contraction today. Reviewed labor precautions and possibility of cervical sweep at [facility].

## 2016-11-04 ENCOUNTER — Encounter: Payer: Self-pay | Admitting: *Deleted

## 2016-11-04 ENCOUNTER — Observation Stay
Admission: EM | Admit: 2016-11-04 | Discharge: 2016-11-04 | Disposition: A | Discharge status: Home / Self Care | Payer: Medicaid Other | Attending: Obstetrics and Gynecology | Admitting: Obstetrics and Gynecology

## 2016-11-04 DIAGNOSIS — Z3A39 39 weeks gestation of pregnancy: Secondary | ICD-10-CM | POA: Diagnosis not present

## 2016-11-04 DIAGNOSIS — O99333 Smoking (tobacco) complicating pregnancy, third trimester: Secondary | ICD-10-CM | POA: Insufficient documentation

## 2016-11-04 DIAGNOSIS — O26893 Other specified pregnancy related conditions, third trimester: Secondary | ICD-10-CM | POA: Diagnosis not present

## 2016-11-04 DIAGNOSIS — O99343 Other mental disorders complicating pregnancy, third trimester: Secondary | ICD-10-CM | POA: Diagnosis not present

## 2016-11-04 DIAGNOSIS — F319 Bipolar disorder, unspecified: Secondary | ICD-10-CM | POA: Insufficient documentation

## 2016-11-04 DIAGNOSIS — R1031 Right lower quadrant pain: Secondary | ICD-10-CM | POA: Insufficient documentation

## 2016-11-04 DIAGNOSIS — R42 Dizziness and giddiness: Secondary | ICD-10-CM | POA: Insufficient documentation

## 2016-11-04 DIAGNOSIS — F17210 Nicotine dependence, cigarettes, uncomplicated: Secondary | ICD-10-CM | POA: Diagnosis not present

## 2016-11-04 NOTE — Discharge Summary (Signed)
Physician Final Progress Note  Patient ID: Adrienne West MRN: [id number] DOB/AGE: 1990/10/27 26 y.o.  Admit date: 11/04/2016 Admitting provider: Tresea Mall, CNM Discharge date: 11/04/2016   Admission Diagnoses: dizziness and right side lower abdominal pain   Discharge Diagnoses:  Active Problems:   Indication for care in labor and delivery, antepartum IUP at [redacted]w[redacted]d with reactive NST, not in labor, fluid changes and discomforts of pregnancy  History of Present Illness: The patient is a 26 y.o. female G2P1001 at [redacted]w[redacted]d who presents for having an episode earlier today of feeling dizzy and light headed. She denies falling or losing consciousness. She also had a pain of 9/10 in lower right quadrant that was intermittent and seemed to be worse when the baby was moving. The pain has not been above a 3 or 4 in the last 2 hours. She admits positive fetal movement. She denies regular strong contractions, LOF, VB, HA, N/V/D, urinary symptoms.  Past Medical History:  Diagnosis Date  . Anxiety   . Bipolar disorder [facility])     Past Surgical History:  Procedure Laterality Date  . aspiration of breast cyst Right 2013  . LAPAROSCOPIC APPENDECTOMY N/A 2012    No current facility-administered medications on file prior to encounter.    Current Outpatient Prescriptions on File Prior to Encounter  Medication Sig Dispense Refill  . Prenatal Vit-Fe Fumarate-FA (MULTIVITAMIN-PRENATAL) 27-0.8 MG TABS tablet Take 1 tablet by mouth daily at 12 noon.      No Known Allergies  Social History   Social History  . Marital status: Married    Spouse name: N/A  . Number of children: N/A  . Years of education: N/A   Occupational History  . Not on file.   Social History Main Topics  . Smoking status: Current Every Day Smoker    Packs/day: 0.75    Types: Cigarettes  . Smokeless tobacco: Former Neurosurgeon     Comment: Quit 3 days ago  . Alcohol use No  . Drug use: No  . Sexual activity: Yes   Other  Topics Concern  . Not on file   Social History Narrative  . No narrative on file    Physical Exam: Temp 98.4 F (36.9 C) (Oral)   Resp 18   Ht  (1.575 m)   Wt 140 lb (63.5 kg)   LMP 02/11/2016   BMI 25.61 kg/m   Gen: NAD CV: RRR Pulm: CTAB Pelvic: 3/60/-2 Toco: negative Fetal Well Being: 135 bpm, moderate variability, +accelerations, -decelerations Ext: no evidence of DVT  Consults: None  Significant Findings/ Diagnostic Studies: no labs- urine is dark amber color  Procedures: NST  Discharge Condition: good  Disposition: 01-Home or Self Care  Diet: Regular diet, increase hydration until urine is clear to light yellow  Discharge Activity: Activity as tolerated  Discharge Instructions    Discharge activity:  No Restrictions    Complete by:  As directed    Discharge diet:  No restrictions    Complete by:  As directed    Fetal Kick Count:  Lie on our left side for one hour after a meal, and count the number of times your baby kicks.  If it is less than 5 times, get up, move around and drink some juice.  Repeat the test 30 minutes later.  If it is still less than 5 kicks in an hour, notify your doctor.    Complete by:  As directed    LABOR:  When conractions begin, you  should start to time them from the beginning of one contraction to the beginning  of the next.  When contractions are 5 - 10 minutes apart or less and have been regular for at least an hour, you should call your health care provider.    Complete by:  As directed    No sexual activity restrictions    Complete by:  As directed    Notify physician for bleeding from the vagina    Complete by:  As directed    Notify physician for blurring of vision or spots before the eyes    Complete by:  As directed    Notify physician for chills or fever    Complete by:  As directed    Notify physician for fainting spells, "black outs" or loss of consciousness    Complete by:  As directed    Notify physician for  increase in vaginal discharge    Complete by:  As directed    Notify physician for leaking of fluid    Complete by:  As directed    Notify physician for pain or burning when urinating    Complete by:  As directed    Notify physician for pelvic pressure (sudden increase)    Complete by:  As directed    Notify physician for severe or continued nausea or vomiting    Complete by:  As directed    Notify physician for sudden gushing of fluid from the vagina (with or without continued leaking)    Complete by:  As directed    Notify physician for sudden, constant, or occasional abdominal pain    Complete by:  As directed    Notify physician if baby moving less than usual    Complete by:  As directed      Allergies as of 11/04/2016   No Known Allergies     Medication List    TAKE these medications   multivitamin-prenatal 27-0.8 MG Tabs tablet Take 1 tablet by mouth daily at 12 noon.      Follow-up Information    [facility] Follow up on 11/08/2016.   Why:  return to labor and delivery for  spontaneous rupture of membranes, contractions every 5 minutes for an hour or any concerns... you can call [phone] for any questions or concerns Contact information: [address] [place] [address] [phone]          Total time spent taking care of this patient: 15 minutes  Signed: Tresea Mall, CNM  11/04/2016, 6:36 PM

## 2016-11-04 NOTE — OB Triage Note (Signed)
Presents with complaints of dizziness, lower abd pain and pressure. Denies any bleeding or leakage of fluid.

## 2016-11-08 ENCOUNTER — Ambulatory Visit (INDEPENDENT_AMBULATORY_CARE_PROVIDER_SITE_OTHER): Payer: Medicaid Other | Admitting: Certified Nurse Midwife

## 2016-11-08 ENCOUNTER — Inpatient Hospital Stay
Admission: EM | Admit: 2016-11-08 | Discharge: 2016-11-10 | DRG: 775 | Disposition: A | Discharge status: Home / Self Care | Payer: Medicaid Other | Attending: Obstetrics and Gynecology | Admitting: Obstetrics and Gynecology

## 2016-11-08 VITALS — BP 92/62 | Wt 136.0 lb

## 2016-11-08 DIAGNOSIS — O0993 Supervision of high risk pregnancy, unspecified, third trimester: Secondary | ICD-10-CM

## 2016-11-08 DIAGNOSIS — O99334 Smoking (tobacco) complicating childbirth: Secondary | ICD-10-CM | POA: Diagnosis present

## 2016-11-08 DIAGNOSIS — F319 Bipolar disorder, unspecified: Secondary | ICD-10-CM | POA: Diagnosis present

## 2016-11-08 DIAGNOSIS — F17210 Nicotine dependence, cigarettes, uncomplicated: Secondary | ICD-10-CM | POA: Diagnosis present

## 2016-11-08 DIAGNOSIS — Z3A40 40 weeks gestation of pregnancy: Secondary | ICD-10-CM

## 2016-11-08 DIAGNOSIS — O99344 Other mental disorders complicating childbirth: Principal | ICD-10-CM | POA: Diagnosis present

## 2016-11-08 MED ORDER — ACETAMINOPHEN 325 MG PO TABS: 650.0000 mg | ORAL_TABLET | ORAL | Status: DC | PRN

## 2016-11-08 NOTE — Progress Notes (Signed)
Pt c/o having dizzy spells, feel like she has sparks and flashes in her vision 4-5 times per day. Desires cervical check.

## 2016-11-08 NOTE — OB Triage Note (Signed)
Patient arrived in triage with c/o ctx's since approx 2030 this evening. Denies leaking of fluid. Positive fetal movement noted. Small amount of "spotting" noted that started today. Patient states she had an office visit earlier today and that CNM "sweeped membranes".  Abdomen palpates moderate during contraction. EFM applied. Patient talking and smiling through contractions.

## 2016-11-09 DIAGNOSIS — Z3A40 40 weeks gestation of pregnancy: Secondary | ICD-10-CM

## 2016-11-09 DIAGNOSIS — Z3493 Encounter for supervision of normal pregnancy, unspecified, third trimester: Secondary | ICD-10-CM | POA: Diagnosis present

## 2016-11-09 DIAGNOSIS — O99334 Smoking (tobacco) complicating childbirth: Secondary | ICD-10-CM | POA: Diagnosis present

## 2016-11-09 DIAGNOSIS — F319 Bipolar disorder, unspecified: Secondary | ICD-10-CM | POA: Diagnosis present

## 2016-11-09 DIAGNOSIS — F17210 Nicotine dependence, cigarettes, uncomplicated: Secondary | ICD-10-CM | POA: Diagnosis present

## 2016-11-09 DIAGNOSIS — O99344 Other mental disorders complicating childbirth: Secondary | ICD-10-CM | POA: Diagnosis present

## 2016-11-09 LAB — CBC
HCT: 33 % — ABNORMAL LOW (ref 35.0–47.0)
HCT: 34.4 % — ABNORMAL LOW (ref 35.0–47.0)
Hemoglobin: 11.6 g/dL — ABNORMAL LOW (ref 12.0–16.0)
Hemoglobin: 11.9 g/dL — ABNORMAL LOW (ref 12.0–16.0)
MCH: 33.1 pg (ref 26.0–34.0)
MCH: 33.1 pg (ref 26.0–34.0)
MCHC: 34.7 g/dL (ref 32.0–36.0)
MCHC: 35.1 g/dL (ref 32.0–36.0)
MCV: 94.2 fL (ref 80.0–100.0)
MCV: 95.3 fL (ref 80.0–100.0)
PLATELETS: 256 10*3/uL (ref 150–440)
PLATELETS: 273 10*3/uL (ref 150–440)
RBC: 3.5 MIL/uL — ABNORMAL LOW (ref 3.80–5.20)
RBC: 3.61 MIL/uL — AB (ref 3.80–5.20)
RDW: 12.8 % (ref 11.5–14.5)
RDW: 12.9 % (ref 11.5–14.5)
WBC: 21.9 10*3/uL — ABNORMAL HIGH (ref 3.6–11.0)
WBC: 22.8 10*3/uL — ABNORMAL HIGH (ref 3.6–11.0)

## 2016-11-09 LAB — TYPE AND SCREEN
ABO/RH(D): O POS
ANTIBODY SCREEN: NEGATIVE

## 2016-11-09 MED ORDER — OXYCODONE-ACETAMINOPHEN 5-325 MG PO TABS
2.0000 | ORAL_TABLET | ORAL | Status: DC | PRN
  Administered 2016-11-09: 2 via ORAL
  Filled 2016-11-09: qty 2

## 2016-11-09 MED ORDER — OXYCODONE-ACETAMINOPHEN 5-325 MG PO TABS
1.0000 | ORAL_TABLET | ORAL | Status: DC | PRN
  Administered 2016-11-09 (×2): 1 via ORAL
  Filled 2016-11-09 (×2): qty 1

## 2016-11-09 MED ORDER — LAMOTRIGINE 25 MG PO TABS
25.0000 mg | ORAL_TABLET | Freq: Every day | ORAL | Status: DC
  Administered 2016-11-09 – 2016-11-10 (×2): 25 mg via ORAL
  Filled 2016-11-09 (×2): qty 1

## 2016-11-09 MED ORDER — PRENATAL MULTIVITAMIN CH
1.0000 | ORAL_TABLET | Freq: Every day | ORAL | Status: DC
  Administered 2016-11-10: 1 via ORAL
  Filled 2016-11-09 (×2): qty 1

## 2016-11-09 MED ORDER — ONDANSETRON HCL 4 MG PO TABS: 4.0000 mg | ORAL_TABLET | ORAL | Status: DC | PRN

## 2016-11-09 MED ORDER — BENZOCAINE-MENTHOL 20-0.5 % EX AERO
1.0000 | INHALATION_SPRAY | CUTANEOUS | Status: DC | PRN
Start: 2016-11-09 — End: 2016-11-10

## 2016-11-09 MED ORDER — SENNOSIDES-DOCUSATE SODIUM 8.6-50 MG PO TABS
2.0000 | ORAL_TABLET | ORAL | Status: DC
  Administered 2016-11-10: 2 via ORAL
  Filled 2016-11-09: qty 2

## 2016-11-09 MED ORDER — COCONUT OIL OIL: 1.0000 "application " | TOPICAL_OIL | Status: DC | PRN

## 2016-11-09 MED ORDER — IBUPROFEN 600 MG PO TABS
600.0000 mg | ORAL_TABLET | Freq: Four times a day (QID) | ORAL | Status: DC
  Administered 2016-11-09 – 2016-11-10 (×6): 600 mg via ORAL
  Filled 2016-11-09 (×6): qty 1

## 2016-11-09 MED ORDER — ACETAMINOPHEN 325 MG PO TABS
650.0000 mg | ORAL_TABLET | ORAL | Status: DC | PRN
Start: 2016-11-09 — End: 2016-11-10

## 2016-11-09 MED ORDER — LACTATED RINGERS IV SOLN
INTRAVENOUS | Status: DC
  Administered 2016-11-09 (×2): via INTRAVENOUS

## 2016-11-09 MED ORDER — AMMONIA AROMATIC IN INHA
RESPIRATORY_TRACT | Status: AC
  Filled 2016-11-09: qty 10

## 2016-11-09 MED ORDER — LACTATED RINGERS IV SOLN: 500.0000 mL | INTRAVENOUS | Status: DC | PRN

## 2016-11-09 MED ORDER — MISOPROSTOL 200 MCG PO TABS
ORAL_TABLET | ORAL | Status: AC
  Filled 2016-11-09: qty 4

## 2016-11-09 MED ORDER — OXYTOCIN 40 UNITS IN LACTATED RINGERS INFUSION - SIMPLE MED: 2.5000 [IU]/h | INTRAVENOUS | Status: DC

## 2016-11-09 MED ORDER — OXYTOCIN BOLUS FROM INFUSION
500.0000 mL | Freq: Once | INTRAVENOUS | Status: AC
  Administered 2016-11-09: 500 mL via INTRAVENOUS

## 2016-11-09 MED ORDER — ACETAMINOPHEN 325 MG PO TABS: 650.0000 mg | ORAL_TABLET | ORAL | Status: DC | PRN

## 2016-11-09 MED ORDER — LIDOCAINE HCL (PF) 1 % IJ SOLN: 30.0000 mL | INTRAMUSCULAR | Status: DC | PRN

## 2016-11-09 MED ORDER — OXYTOCIN 10 UNIT/ML IJ SOLN
INTRAMUSCULAR | Status: AC
  Filled 2016-11-09: qty 2

## 2016-11-09 MED ORDER — DIPHENHYDRAMINE HCL 25 MG PO CAPS: 25.0000 mg | ORAL_CAPSULE | Freq: Four times a day (QID) | ORAL | Status: DC | PRN

## 2016-11-09 MED ORDER — DIBUCAINE 1 % RE OINT: 1.0000 "application " | TOPICAL_OINTMENT | RECTAL | Status: DC | PRN

## 2016-11-09 MED ORDER — ONDANSETRON HCL 4 MG/2ML IJ SOLN: 4.0000 mg | INTRAMUSCULAR | Status: DC | PRN

## 2016-11-09 MED ORDER — BUTORPHANOL TARTRATE 1 MG/ML IJ SOLN: 1.0000 mg | INTRAMUSCULAR | Status: DC | PRN

## 2016-11-09 MED ORDER — OXYTOCIN 40 UNITS IN LACTATED RINGERS INFUSION - SIMPLE MED
INTRAVENOUS | Status: AC
Start: 2016-11-09 — End: 2016-11-09
  Administered 2016-11-09: 500 mL via INTRAVENOUS
  Filled 2016-11-09: qty 1000

## 2016-11-09 MED ORDER — TETANUS-DIPHTH-ACELL PERTUSSIS 5-2.5-18.5 LF-MCG/0.5 IM SUSP: 0.5000 mL | Freq: Once | INTRAMUSCULAR | Status: DC

## 2016-11-09 MED ORDER — WITCH HAZEL-GLYCERIN EX PADS: 1.0000 "application " | MEDICATED_PAD | CUTANEOUS | Status: DC | PRN

## 2016-11-09 MED ORDER — SOD CITRATE-CITRIC ACID 500-334 MG/5ML PO SOLN
30.0000 mL | ORAL | Status: DC | PRN
  Filled 2016-11-09: qty 30

## 2016-11-09 MED ORDER — LIDOCAINE HCL (PF) 1 % IJ SOLN
INTRAMUSCULAR | Status: AC
  Filled 2016-11-09: qty 30

## 2016-11-09 MED ORDER — ONDANSETRON HCL 4 MG/2ML IJ SOLN: 4.0000 mg | Freq: Four times a day (QID) | INTRAMUSCULAR | Status: DC | PRN

## 2016-11-09 MED ORDER — SIMETHICONE 80 MG PO CHEW: 80.0000 mg | CHEWABLE_TABLET | ORAL | Status: DC | PRN

## 2016-11-09 NOTE — Plan of Care (Signed)
Problem: Pain Managment: Goal: General experience of comfort will improve Outcome: Progressing Denies Pain  Problem: Life Cycle: Goal: Risk for postpartum hemorrhage will decrease Outcome: Progressing U/-1 with scant Lochia  Problem: Pain Management: Goal: General experience of comfort will improve and pain level will decrease Outcome: Progressing Denies Pain at this time.

## 2016-11-09 NOTE — Progress Notes (Signed)
Admit Date: 11/08/2016 Today's Date: 11/09/2016  Post Partum Day 0  Subjective:  no complaints and concern for Bipolar and risk for PPD  Objective: Temp:  [97.9 F (36.6 C)-98.5 F (36.9 C)] 98.2 F (36.8 C) (05/05 0713) Pulse Rate:  [54-90] 67 (05/05 0713) Resp:  [18-20] 18 (05/05 0713) BP: (92-119)/(52-69) 96/57 (05/05 0713) SpO2:  [97 %-100 %] 97 % (05/05 0713) Weight:  [136 lb (61.7 kg)] 136 lb (61.7 kg) (05/04 2138)  Physical Exam:  General: alert, cooperative and no distress Lochia: appropriate Uterine Fundus: firm Incision: none DVT Evaluation: No evidence of DVT seen on physical exam. Negative Homan's sign.   Recent Labs  11/09/16 0200 11/09/16 0615  HGB 11.6* 10.9*  HCT 33.0* 32.7*    Assessment/Plan: Contraception (plans Vasec), Bottle Feeding and Infant doing well  Start Lamictal for Bipolar at 25 mg daily.  Consider options for PPD tx as warranted.   LOS: 0 days   Letitia Libraobert Paul Jiana Lemaire [facility] 11/09/2016, 9:58 AM

## 2016-11-09 NOTE — Discharge Summary (Signed)
Obstetric Discharge Summary Reason for Admission: onset of labor Prenatal Procedures: none Intrapartum Procedures: spontaneous vaginal delivery Postpartum Procedures: none Complications-Operative and Postpartum: none Hemoglobin  Date Value Ref Range Status  11/09/2016 10.9 (L) 12.0 - 16.0 g/dL Final   HGB  Date Value Ref Range Status  12/07/2012 13.0 12.0 - 16.0 g/dL Final   HCT  Date Value Ref Range Status  11/09/2016 32.7 (L) 35.0 - 47.0 % Final  12/07/2012 38.5 35.0 - 47.0 % Final    Physical Exam:  General: alert, cooperative and no distress Lochia: appropriate Uterine Fundus: firm Incision: none DVT Evaluation: No evidence of DVT seen on physical exam. Negative Homan's sign.  Discharge Diagnoses: Term Pregnancy-delivered  Discharge Information: Date: 11/10/2016 Activity: pelvic rest Diet: routine Allergies as of 11/10/2016   No Known Allergies     Medication List    TAKE these medications   lamoTRIgine 25 MG tablet Commonly known as:  LAMICTAL Take 1 tablet (25 mg total) by mouth daily.   multivitamin-prenatal 27-0.8 MG Tabs tablet Take 1 tablet by mouth daily at 12 noon.       Condition: stable Discharge to: home Follow-up Information    Vena AustriaStaebler, Andreas, MD Follow up in 1 week(s).   Specialty:  Obstetrics and Gynecology Why:  depression check Contact information: [address] [place] [address] [phone]           Newborn Data: Live born Female  Birth Weight:  2780g APGAR: 9, 9  Home with mother.  Adrienne West 11/10/2016, 8:33 AM

## 2016-11-09 NOTE — H&P (Signed)
Obstetric H&P   Chief Complaint: Contractons  Prenatal Care Provider: [facility]  History of Present Illness: 26 y.o. G2P1001 6124w1d by 14 week US derived EDC of 11/08/2016 presenting to L&D with contractions.  Has been 3.5cm had membranes stripped in clinic today.  3.5cm on admission with cervical change to 4.5cm on presentation.  Will admit for term labor.  +FM, no LOF, no VB,  PNC uncomplicated other than 46month lapse of care in 3rd trimester.  History of depression on Lamictal prior to pregnancy.  Prenatal Labs: Blood type/Rh O positive  Antibody screen negative  Rubella Varicella Immune immune  RPR Non reactive  HBsAg negative  HIV Non reactive  GC negative  Chlamydia negative  Genetic screening Negative First   1 hour GTT 96  3 hour GTT NA  GBS negative on 10/16/2016     Review of Systems: 10 point review of systems negative unless otherwise noted in HPI  Past Medical History: Past Medical History:  Diagnosis Date  . Anxiety   . Bipolar disorder [facility](HCC)     Past Surgical History: Past Surgical History:  Procedure Laterality Date  . aspiration of breast cyst Right 2013  . LAPAROSCOPIC APPENDECTOMY N/A 2012    Family History: Family History  Problem Relation Age of Onset  . Bipolar disorder Mother   . Endometriosis Mother   . Breast cancer Mother [id number]  . Stroke Father   . Hyperlipidemia Father   . Hypertension Father   . Bipolar disorder Sister   . COPD Maternal Grandmother   . Hypothyroidism Maternal Grandmother   . Diabetes Maternal Grandfather   . Rheum arthritis Paternal Grandmother   . AAA (abdominal aortic aneurysm) Paternal Grandfather   . Stroke Paternal Grandfather     Social History: Social History   Social History  . Marital status: Married    Spouse name: N/A  . Number of children: N/A  . Years of education: N/A   Occupational History  . Not on file.   Social History Main Topics  . Smoking status: Current Every Day Smoker    Packs/day:  0.75    Types: Cigarettes  . Smokeless tobacco: Former NeurosurgeonUser     Comment: Quit 3 days ago  . Alcohol use No  . Drug use: No  . Sexual activity: Yes   Other Topics Concern  . Not on file   Social History Narrative  . No narrative on file    Medications: Prior to Admission medications   Medication Sig Start Date End Date Taking? Authorizing Provider  Prenatal Vit-Fe Fumarate-FA (MULTIVITAMIN-PRENATAL) 27-0.8 MG TABS tablet Take 1 tablet by mouth daily at 12 noon.   Yes Historical Provider, MD    Allergies: No Known Allergies  Physical Exam: Vitals: Blood pressure 108/65, pulse 81, temperature 97.9 F (36.6 C), temperature source Oral, resp. rate 18, height 5\' 2"  (1.575 m), weight 136 lb (61.7 kg), last menstrual period 02/11/2016.  Urine Dip Protein: *N/A  FHT: 150, moderate, +accels, no decels Toco: q215min  General: NAD HEENT: normocephalic, anicteric Pulmonary: No increased work of breathing Cardiovascular: RRR, distal pulses 2+ Abdomen: Gravid, non-tender Leopolds:vtxGenitourinary: 4.5/70/-1 Extremities: no edema, erythema, or tenderness Neurologic: Grossly intact Psychiatric: mood appropriate, affect full  Labs: No results found for this or any previous visit (from the past 24 hour(s)).  Assessment: 26 y.o. G2P1001 6724w1d by 11/08/2016, presenting in term labor  Plan: 1) Labor - admit, plan AROM once admission cmoplete  2) Fetus - category I tracing  3)Depression -  1 week depression check postpartum  4) TDAP - 09/04/2016  5) Disposition - pending delivery

## 2016-11-09 NOTE — Progress Notes (Signed)
Subjective:  Painfully contracting  Objective:   Vitals: Blood pressure 104/60, pulse 75, temperature 97.9 F (36.6 C), temperature source Oral, resp. rate 20, height 5\' 2"  (1.575 m), weight 136 lb (61.7 kg), last menstrual period 02/11/2016. General: NAD Abdomen: gravid, non-tender Cervical Exam:  Dilation: 7.5 Effacement (%): 70 Cervical Position: Posterior Station: 0 Presentation: Vertex Exam by:: Dr. Bonney AidStaebler AROM minimal fluid, clear  FHT: 115, moderate, +accels, no decels Toco: q532min  Results for orders placed or performed during the hospital encounter of 11/08/16 (from the past 24 hour(s))  CBC     Status: Abnormal   Collection Time: 11/09/16 12:29 AM  Result Value Ref Range   WBC 21.9 (H) 3.6 - 11.0 K/uL   RBC 3.61 (L) 3.80 - 5.20 MIL/uL   Hemoglobin 11.9 (L) 12.0 - 16.0 g/dL   HCT 16.134.4 (L) 09.635.0 - 04.547.0 %   MCV 95.3 80.0 - 100.0 fL   MCH 33.1 26.0 - 34.0 pg   MCHC 34.7 32.0 - 36.0 g/dL   RDW 40.912.9 81.111.5 - 91.414.5 %   Platelets 273 150 - 440 K/uL  Type and screen [facility]     Status: None   Collection Time: 11/09/16 12:29 AM  Result Value Ref Range   ABO/RH(D) O POS    Antibody Screen NEG    Sample Expiration 11/12/2016   CBC     Status: Abnormal   Collection Time: 11/09/16  2:00 AM  Result Value Ref Range   WBC 22.8 (H) 3.6 - 11.0 K/uL   RBC 3.50 (L) 3.80 - 5.20 MIL/uL   Hemoglobin 11.6 (L) 12.0 - 16.0 g/dL   HCT 78.233.0 (L) 95.635.0 - 21.347.0 %   MCV 94.2 80.0 - 100.0 fL   MCH 33.1 26.0 - 34.0 pg   MCHC 35.1 32.0 - 36.0 g/dL   RDW 08.612.8 57.811.5 - 46.914.5 %   Platelets 256 150 - 440 K/uL    Assessment:   26 y.o. G2P1001 3986w1d term labor  Plan:   1) Labor -AROM clear  2) Fetus - cat I tracing  3) WBC did not improve, proceeded with AROM may have nitrous

## 2016-11-09 NOTE — Progress Notes (Signed)
Subjective:  Painfully contracting  Objective:   Vitals: Blood pressure 104/60, pulse 75, temperature 97.9 F (36.6 C), temperature source Oral, resp. rate 20, height 5\' 2"  (1.575 m), weight 136 lb (61.7 kg), last menstrual period 02/11/2016. General: NAD Abdomen: gravid, non-tender Cervical Exam:  Dilation: 6 Effacement (%): 70 Cervical Position: Posterior Station: -1 Presentation: Vertex Exam by:: Dr. Bonney AidStaebler  FHT: 110, moderate, +accels, no decels Toco: q353min  Results for orders placed or performed during the hospital encounter of 11/08/16 (from the past 24 hour(s))  CBC     Status: Abnormal   Collection Time: 11/09/16 12:29 AM  Result Value Ref Range   WBC 21.9 (H) 3.6 - 11.0 K/uL   RBC 3.61 (L) 3.80 - 5.20 MIL/uL   Hemoglobin 11.9 (L) 12.0 - 16.0 g/dL   HCT 16.134.4 (L) 09.635.0 - 04.547.0 %   MCV 95.3 80.0 - 100.0 fL   MCH 33.1 26.0 - 34.0 pg   MCHC 34.7 32.0 - 36.0 g/dL   RDW 40.912.9 81.111.5 - 91.414.5 %   Platelets 273 150 - 440 K/uL  Type and screen [facility]     Status: None (Preliminary result)   Collection Time: 11/09/16 12:29 AM  Result Value Ref Range   ABO/RH(D) PENDING    Antibody Screen PENDING    Sample Expiration 11/12/2016     Assessment:   26 y.o. G2P1001 4024w1d term labor  Plan:   1) Labor - progressing  2) Fetus - cat I  3) Epidural - WBC over 20K, will finish epidural pre-load and recheck. At present there is no evidence of infection such as maternal or fetal tachycadia, maternal fever, fundal tenderness that would warrant starting antibiotics.

## 2016-11-10 LAB — CBC
HCT: 32.7 % — ABNORMAL LOW (ref 35.0–47.0)
HEMATOCRIT: 33 % — AB (ref 35.0–47.0)
HEMOGLOBIN: 10.9 g/dL — AB (ref 12.0–16.0)
Hemoglobin: 11.4 g/dL — ABNORMAL LOW (ref 12.0–16.0)
MCH: 31.9 pg (ref 26.0–34.0)
MCH: 33.6 pg (ref 26.0–34.0)
MCHC: 33.4 g/dL (ref 32.0–36.0)
MCHC: 34.5 g/dL (ref 32.0–36.0)
MCV: 95.5 fL (ref 80.0–100.0)
MCV: 97.4 fL (ref 80.0–100.0)
Platelets: 232 10*3/uL (ref 150–440)
Platelets: 225 10*3/uL (ref 150–440)
RBC: 3.43 MIL/uL — AB (ref 3.80–5.20)
RBC: 3.39 MIL/uL — ABNORMAL LOW (ref 3.80–5.20)
RDW: 13.1 % (ref 11.5–14.5)
RDW: 13.2 % (ref 11.5–14.5)
WBC: 28.4 10*3/uL — ABNORMAL HIGH (ref 3.6–11.0)
WBC: 12.7 10*3/uL — AB (ref 3.6–11.0)

## 2016-11-10 LAB — RPR: RPR: NONREACTIVE

## 2016-11-10 MED ORDER — LAMOTRIGINE 25 MG PO TABS: 25.0000 mg | ORAL_TABLET | Freq: Every day | ORAL | 1 refills | Status: DC

## 2016-11-10 NOTE — Progress Notes (Signed)
Admit Date: 11/08/2016 Today's Date: 11/10/2016  Post Partum Day 1  Subjective:  no complaints and concern for Bipolar and risk for PPD  Objective: Temp:  [97.7 F (36.5 C)-98 F (36.7 C)] 97.8 F (36.6 C) (05/05 2352) Pulse Rate:  [63-73] 69 (05/05 2352) Resp:  [18-20] 18 (05/05 2352) BP: (91-100)/(50-64) 97/61 (05/05 2352) SpO2:  [98 %-100 %] 98 % (05/05 2352)  Physical Exam:  General: alert, cooperative and no distress Lochia: appropriate Uterine Fundus: firm Incision: none DVT Evaluation: No evidence of DVT seen on physical exam. Negative Homan's sign.   Recent Labs  11/09/16 0200 11/09/16 0615  HGB 11.6* 10.9*  HCT 33.0* 32.7*    Assessment/Plan: Contraception (plans Vasec), Bottle Feeding and Infant doing well  Lamictal for Bipolar at 25 mg daily.  Consider options for PPD tx as warranted.   LOS: 1 day   Letitia Libraobert Paul Artemisa Sladek [facility] 11/10/2016, 8:34 AM

## 2016-11-10 NOTE — Progress Notes (Signed)
All discharge instructions given to patient and she voiced understanding of all instructions given. She will make her own f/u appt. Prescription given. Patient discharged home with infant in arms escorted out by cna

## 2016-11-10 NOTE — Progress Notes (Signed)
Sparks and flashes in vision may be due to low blood pressure or visual migraines.  In NAD at this time. Contractions irregular.Good FM. GBS negative Cervix 3/50%/-1. Membranes swept and labor precautions given. Scheduled for IOL 11/15/2016 probably with Cytotec or Pitocin

## 2016-11-10 NOTE — Discharge Instructions (Signed)
Follow up sooner with fever, problems breathing, pain not helped by medications, severe depression( more than just baby blues, wanting to hurt yourself or the baby), severe bleeding ( saturating more than one pad an hour or large palm sized clots), no heavy lifting , no driving while taking narcotics, no douches, intercourse, tampons or enemas for 6 weeks Vaginal Delivery, Care After °Refer to this sheet in the next few weeks. These instructions provide you with information about caring for yourself after vaginal delivery. Your health care provider may also give you more specific instructions. Your treatment has been planned according to current medical practices, but problems sometimes occur. Call your health care provider if you have any problems or questions. °What can I expect after the procedure? °After vaginal delivery, it is common to have: °· Some bleeding from your vagina. °· Soreness in your abdomen, your vagina, and the area of skin between your vaginal opening and your anus (perineum). °· Pelvic cramps. °· Fatigue. °Follow these instructions at home: °Medicines  °· Take over-the-counter and prescription medicines only as told by your health care provider. °· If you were prescribed an antibiotic medicine, take it as told by your health care provider. Do not stop taking the antibiotic until it is finished. °Driving  ° °· Do not drive or operate heavy machinery while taking prescription pain medicine. °· Do not drive for 24 hours if you received a sedative. °Lifestyle  °· Do not drink alcohol. This is especially important if you are breastfeeding or taking medicine to relieve pain. °· Do not use tobacco products, including cigarettes, chewing tobacco, or e-cigarettes. If you need help quitting, ask your health care provider. °Eating and drinking  °· Drink at least 8 eight-ounce glasses of water every day unless you are told not to by your health care provider. If you choose to breastfeed your baby, you may  need to drink more water than this. °· Eat high-fiber foods every day. These foods may help prevent or relieve constipation. High-fiber foods include: °¨ Whole grain cereals and breads. °¨ Brown rice. °¨ Beans. °¨ Fresh fruits and vegetables. °Activity  °· Return to your normal activities as told by your health care provider. Ask your health care provider what activities are safe for you. °· Rest as much as possible. Try to rest or take a nap when your baby is sleeping. °· Do not lift anything that is heavier than your baby or 10 lb (4.5 kg) until your health care provider says that it is safe. °· Talk with your health care provider about when you can engage in sexual activity. This may depend on your: °¨ Risk of infection. °¨ Rate of healing. °¨ Comfort and desire to engage in sexual activity. °Vaginal Care  °· If you have an episiotomy or a vaginal tear, check the area every day for signs of infection. Check for: °¨ More redness, swelling, or pain. °¨ More fluid or blood. °¨ Warmth. °¨ Pus or a bad smell. °· Do not use tampons or douches until your health care provider says this is safe. °· Watch for any blood clots that may pass from your vagina. These may look like clumps of dark red, brown, or black discharge. °General instructions  °· Keep your perineum clean and dry as told by your health care provider. °· Wear loose, comfortable clothing. °· Wipe from front to back when you use the toilet. °· Ask your health care provider if you can shower or take a bath.   If you had an episiotomy or a perineal tear during labor and delivery, your health care provider may tell you not to take baths for a certain length of time. °· Wear a bra that supports your breasts and fits you well. °· If possible, have someone help you with household activities and help care for your baby for at least a few days after you leave the hospital. °· Keep all follow-up visits for you and your baby as told by your health care provider. This is  important. °Contact a health care provider if: °· You have: °¨ Vaginal discharge that has a bad smell. °¨ Difficulty urinating. °¨ Pain when urinating. °¨ A sudden increase or decrease in the frequency of your bowel movements. °¨ More redness, swelling, or pain around your episiotomy or vaginal tear. °¨ More fluid or blood coming from your episiotomy or vaginal tear. °¨ Pus or a bad smell coming from your episiotomy or vaginal tear. °¨ A fever. °¨ A rash. °¨ Little or no interest in activities you used to enjoy. °¨ Questions about caring for yourself or your baby. °· Your episiotomy or vaginal tear feels warm to the touch. °· Your episiotomy or vaginal tear is separating or does not appear to be healing. °· Your breasts are painful, hard, or turn red. °· You feel unusually sad or worried. °· You feel nauseous or you vomit. °· You pass large blood clots from your vagina. If you pass a blood clot from your vagina, save it to show to your health care provider. Do not flush blood clots down the toilet without having your health care provider look at them. °· You urinate more than usual. °· You are dizzy or light-headed. °· You have not breastfed at all and you have not had a menstrual period for 12 weeks after delivery. °· You have stopped breastfeeding and you have not had a menstrual period for 12 weeks after you stopped breastfeeding. °Get help right away if: °· You have: °¨ Pain that does not go away or does not get better with medicine. °¨ Chest pain. °¨ Difficulty breathing. °¨ Blurred vision or spots in your vision. °¨ Thoughts about hurting yourself or your baby. °· You develop pain in your abdomen or in one of your legs. °· You develop a severe headache. °· You faint. °· You bleed from your vagina so much that you fill two sanitary pads in one hour. °This information is not intended to replace advice given to you by your health care provider. Make sure you discuss any questions you have with your health care  provider. °Document Released: 06/21/2000 Document Revised: 12/06/2015 Document Reviewed: 07/09/2015 °[employer]vier Interactive Patient Education © 2017 [employer] ° °

## 2016-12-24 ENCOUNTER — Ambulatory Visit: Payer: Medicaid Other | Admitting: Obstetrics and Gynecology

## 2016-12-24 ENCOUNTER — Ambulatory Visit (INDEPENDENT_AMBULATORY_CARE_PROVIDER_SITE_OTHER): Payer: Medicaid Other | Admitting: Obstetrics and Gynecology

## 2016-12-24 ENCOUNTER — Encounter: Payer: Self-pay | Admitting: Obstetrics and Gynecology

## 2016-12-24 NOTE — Progress Notes (Signed)
Obstetrics & Gynecology Office Visit   Chief Complaint:  Chief Complaint  Patient presents with  . Postpartum Care    History of Present Illness:  Date of delivery: 11/09/2016 Type of delivery: Term Vaginal   Pt had the following problems during pregnancy or labor: none Patient has had what problems since the delivery: No GI, GU, Breast, or psychiatric concerns  Newborn Details:  Feeding plan: Bottle Post partum depression/anxiety noted None Edinburgh Post-Partum Depression Score: 7 Date of last PAP: 12/10/2014 NIL  Review of Systems: 10 point review of systems negative unless otherwise noted in HPI  Past Medical History:  Past Medical History:  Diagnosis Date  . Anxiety   . Bipolar disorder [facility](HCC)     Past Surgical History:  Past Surgical History:  Procedure Laterality Date  . aspiration of breast cyst Right 2013  . LAPAROSCOPIC APPENDECTOMY N/A 2012    Gynecologic History: Patient's last menstrual period was 12/14/2016 (exact date).  Obstetric History: [id number]  Family History:  Family History  Problem Relation Age of Onset  . Bipolar disorder Mother   . Endometriosis Mother   . Breast cancer Mother [id number]  . Stroke Father   . Hyperlipidemia Father   . Hypertension Father   . Bipolar disorder Sister   . COPD Maternal Grandmother   . Hypothyroidism Maternal Grandmother   . Diabetes Maternal Grandfather   . Rheum arthritis Paternal Grandmother   . AAA (abdominal aortic aneurysm) Paternal Grandfather   . Stroke Paternal Grandfather     Social History:  Social History   Social History  . Marital status: Married    Spouse name: N/A  . Number of children: N/A  . Years of education: N/A   Occupational History  . Not on file.   Social History Main Topics  . Smoking status: Current Every Day Smoker    Packs/day: 0.75    Types: Cigarettes  . Smokeless tobacco: Former NeurosurgeonUser     Comment: Quit 3 days ago  . Alcohol use No  . Drug use: No  . Sexual  activity: Yes   Other Topics Concern  . Not on file   Social History Narrative  . No narrative on file    Allergies:  No Known Allergies  Medications: Prior to Admission medications   Medication Sig Start Date End Date Taking? Authorizing Provider  lamoTRIgine (LAMICTAL) 25 MG tablet Take 1 tablet (25 mg total) by mouth daily. 11/10/16   Nadara MustardHarris, Robert P, MD    Physical Exam Vitals:  Vitals:   12/24/16 1549  BP: 100/66  Pulse: 91   Patient's last menstrual period was 12/14/2016 (exact date).  General: NAD HEENT: normocephalic, anicteric Pulmonary: No increased work of breathing  hepatomegaly, splenomegaly or masses palpable. No evidence of hernia  Genitourinary:  External: Normal external female genitalia.  Normal urethral meatus, normal  Bartholin's and Skene's glands.    Vagina: Normal vaginal mucosa, no evidence of prolapse.    Cervix: Grossly normal in appearance, no bleeding  Uterus: Non-enlarged, mobile, normal contour.  No CMT  Adnexa: ovaries non-enlarged, no adnexal masses  Rectal: deferred  Lymphatic: no evidence of inguinal lymphadenopathy Extremities: no edema, erythema, or tenderness Neurologic: Grossly intact Psychiatric: mood appropriate, affect full  Female chaperone present for pelvic and breast  portions of the physical exam  Assessment: 26 y.o. [id number] No problem-specific Assessment & Plan notes found for this encounter.   Plan: Problem List Items Addressed This Visit    None  Visit Diagnoses    Encounter for postpartum visit    -  Primary      1) Pap smear - up to date 2) Contraception - condoms 3) Bipolar- has psychiatry follow up 4) RTC 1 year routine annual exam

## 2017-04-17 ENCOUNTER — Ambulatory Visit (INDEPENDENT_AMBULATORY_CARE_PROVIDER_SITE_OTHER): Payer: Medicaid Other | Admitting: Maternal Newborn

## 2017-04-17 ENCOUNTER — Encounter: Payer: Self-pay | Admitting: Maternal Newborn

## 2017-04-17 VITALS — BP 120/80 | Wt 121.0 lb

## 2017-04-17 DIAGNOSIS — Z3A10 10 weeks gestation of pregnancy: Secondary | ICD-10-CM | POA: Diagnosis not present

## 2017-04-17 DIAGNOSIS — O99331 Smoking (tobacco) complicating pregnancy, first trimester: Secondary | ICD-10-CM | POA: Insufficient documentation

## 2017-04-17 DIAGNOSIS — Z3201 Encounter for pregnancy test, result positive: Secondary | ICD-10-CM

## 2017-04-17 DIAGNOSIS — Z3481 Encounter for supervision of other normal pregnancy, first trimester: Secondary | ICD-10-CM

## 2017-04-17 DIAGNOSIS — F17210 Nicotine dependence, cigarettes, uncomplicated: Secondary | ICD-10-CM

## 2017-04-17 DIAGNOSIS — F3170 Bipolar disorder, currently in remission, most recent episode unspecified: Secondary | ICD-10-CM

## 2017-04-17 DIAGNOSIS — O99341 Other mental disorders complicating pregnancy, first trimester: Secondary | ICD-10-CM

## 2017-04-17 DIAGNOSIS — F319 Bipolar disorder, unspecified: Secondary | ICD-10-CM | POA: Insufficient documentation

## 2017-04-17 DIAGNOSIS — O0993 Supervision of high risk pregnancy, unspecified, third trimester: Secondary | ICD-10-CM | POA: Insufficient documentation

## 2017-04-17 LAB — POCT URINE PREGNANCY: PREG TEST UR: POSITIVE — AB

## 2017-04-17 NOTE — Patient Instructions (Signed)
First Trimester of Pregnancy The first trimester of pregnancy is from week 1 until the end of week 13 (months 1 through 3). A week after a sperm fertilizes an egg, the egg will implant on the wall of the uterus. This embryo will begin to develop into a baby. Genes from you and your partner will form the baby. The female genes will determine whether the baby will be a boy or a girl. At 6-8 weeks, the eyes and face will be formed, and the heartbeat can be seen on ultrasound. At the end of 12 weeks, all the baby's organs will be formed. Now that you are pregnant, you will want to do everything you can to have a healthy baby. Two of the most important things are to get good prenatal care and to follow your health care provider's instructions. Prenatal care is all the medical care you receive before the baby's birth. This care will help prevent, find, and treat any problems during the pregnancy and childbirth. Body changes during your first trimester Your body goes through many changes during pregnancy. The changes vary from woman to woman.  You may gain or lose a couple of pounds at first.  You may feel sick to your stomach (nauseous) and you may throw up (vomit). If the vomiting is uncontrollable, call your health care provider.  You may tire easily.  You may develop headaches that can be relieved by medicines. All medicines should be approved by your health care provider.  You may urinate more often. Painful urination may mean you have a bladder infection.  You may develop heartburn as a result of your pregnancy.  You may develop constipation because certain hormones are causing the muscles that push stool through your intestines to slow down.  You may develop hemorrhoids or swollen veins (varicose veins).  Your breasts may begin to grow larger and become tender. Your nipples may stick out more, and the tissue that surrounds them (areola) may become darker.  Your gums may bleed and may be  sensitive to brushing and flossing.  Dark spots or blotches (chloasma, mask of pregnancy) may develop on your face. This will likely fade after the baby is born.  Your menstrual periods will stop.  You may have a loss of appetite.  You may develop cravings for certain kinds of food.  You may have changes in your emotions from day to day, such as being excited to be pregnant or being concerned that something may go wrong with the pregnancy and baby.  You may have more vivid and strange dreams.  You may have changes in your hair. These can include thickening of your hair, rapid growth, and changes in texture. Some women also have hair loss during or after pregnancy, or hair that feels dry or thin. Your hair will most likely return to normal after your baby is born.  What to expect at prenatal visits During a routine prenatal visit:  You will be weighed to make sure you and the baby are growing normally.  Your blood pressure will be taken.  Your abdomen will be measured to track your baby's growth.  The fetal heartbeat will be listened to between weeks 10 and 14 of your pregnancy.  Test results from any previous visits will be discussed.  Your health care provider may ask you:  How you are feeling.  If you are feeling the baby move.  If you have had any abnormal symptoms, such as leaking fluid, bleeding, severe headaches,   or abdominal cramping.  If you are using any tobacco products, including cigarettes, chewing tobacco, and electronic cigarettes.  If you have any questions.  Other tests that may be performed during your first trimester include:  Blood tests to find your blood type and to check for the presence of any previous infections. The tests will also be used to check for low iron levels (anemia) and protein on red blood cells (Rh antibodies). Depending on your risk factors, or if you previously had diabetes during pregnancy, you may have tests to check for high blood  sugar that affects pregnant women (gestational diabetes).  Urine tests to check for infections, diabetes, or protein in the urine.  An ultrasound to confirm the proper growth and development of the baby.  Fetal screens for spinal cord problems (spina bifida) and Down syndrome.  HIV (human immunodeficiency virus) testing. Routine prenatal testing includes screening for HIV, unless you choose not to have this test.  You may need other tests to make sure you and the baby are doing well.  Follow these instructions at home: Medicines  Follow your health care provider's instructions regarding medicine use. Specific medicines may be either safe or unsafe to take during pregnancy.  Take a prenatal vitamin that contains at least 600 micrograms (mcg) of folic acid.  If you develop constipation, try taking a stool softener if your health care provider approves. Eating and drinking  Eat a balanced diet that includes fresh fruits and vegetables, whole grains, good sources of protein such as meat, eggs, or tofu, and low-fat dairy. Your health care provider will help you determine the amount of weight gain that is right for you.  Avoid raw meat and uncooked cheese. These carry germs that can cause birth defects in the baby.  Eating four or five small meals rather than three large meals a day may help relieve nausea and vomiting. If you start to feel nauseous, eating a few soda crackers can be helpful. Drinking liquids between meals, instead of during meals, also seems to help ease nausea and vomiting.  Limit foods that are high in fat and processed sugars, such as fried and sweet foods.  To prevent constipation: ? Eat foods that are high in fiber, such as fresh fruits and vegetables, whole grains, and beans. ? Drink enough fluid to keep your urine clear or pale yellow. Activity  Exercise only as directed by your health care provider. Most women can continue their usual exercise routine during  pregnancy. Try to exercise for 30 minutes at least 5 days a week. Exercising will help you: ? Control your weight. ? Stay in shape. ? Be prepared for labor and delivery.  Experiencing pain or cramping in the lower abdomen or lower back is a good sign that you should stop exercising. Check with your health care provider before continuing with normal exercises.  Try to avoid standing for long periods of time. Move your legs often if you must stand in one place for a long time.  Avoid heavy lifting.  Wear low-heeled shoes and practice good posture.  You may continue to have sex unless your health care provider tells you not to. Relieving pain and discomfort  Wear a good support bra to relieve breast tenderness.  Take warm sitz baths to soothe any pain or discomfort caused by hemorrhoids. Use hemorrhoid cream if your health care provider approves.  Rest with your legs elevated if you have leg cramps or low back pain.  If you develop   varicose veins in your legs, wear support hose. Elevate your feet for 15 minutes, 3-4 times a day. Limit salt in your diet. Prenatal care  Schedule your prenatal visits by the twelfth week of pregnancy. They are usually scheduled monthly at first, then more often in the last 2 months before delivery.  Write down your questions. Take them to your prenatal visits.  Keep all your prenatal visits as told by your health care provider. This is important. Safety  Wear your seat belt at all times when driving.  Make a list of emergency phone numbers, including numbers for family, friends, the hospital, and police and fire departments. General instructions  Ask your health care provider for a referral to a local prenatal education class. Begin classes no later than the beginning of month 6 of your pregnancy.  Ask for help if you have counseling or nutritional needs during pregnancy. Your health care provider can offer advice or refer you to specialists for help  with various needs.  Do not use hot tubs, steam rooms, or saunas.  Do not douche or use tampons or scented sanitary pads.  Do not cross your legs for long periods of time.  Avoid cat litter boxes and soil used by cats. These carry germs that can cause birth defects in the baby and possibly loss of the fetus by miscarriage or stillbirth.  Avoid all smoking, herbs, alcohol, and medicines not prescribed by your health care provider. Chemicals in these products affect the formation and growth of the baby.  Do not use any products that contain nicotine or tobacco, such as cigarettes and e-cigarettes. If you need help quitting, ask your health care provider. You may receive counseling support and other resources to help you quit.  Schedule a dentist appointment. At home, brush your teeth with a soft toothbrush and be gentle when you floss. Contact a health care provider if:  You have dizziness.  You have mild pelvic cramps, pelvic pressure, or nagging pain in the abdominal area.  You have persistent nausea, vomiting, or diarrhea.  You have a bad smelling vaginal discharge.  You have pain when you urinate.  You notice increased swelling in your face, hands, legs, or ankles.  You are exposed to fifth disease or chickenpox.  You are exposed to German measles (rubella) and have never had it. Get help right away if:  You have a fever.  You are leaking fluid from your vagina.  You have spotting or bleeding from your vagina.  You have severe abdominal cramping or pain.  You have rapid weight gain or loss.  You vomit blood or material that looks like coffee grounds.  You develop a severe headache.  You have shortness of breath.  You have any kind of trauma, such as from a fall or a car accident. Summary  The first trimester of pregnancy is from week 1 until the end of week 13 (months 1 through 3).  Your body goes through many changes during pregnancy. The changes vary from  woman to woman.  You will have routine prenatal visits. During those visits, your health care provider will examine you, discuss any test results you may have, and talk with you about how you are feeling. This information is not intended to replace advice given to you by your health care provider. Make sure you discuss any questions you have with your health care provider. Document Released: 06/18/2001 Document Revised: 06/05/2016 Document Reviewed: 06/05/2016 Elsevier Interactive Patient Education  2017 Elsevier   Inc.  
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
## 2017-04-18 LAB — RPR+RH+ABO+RUB AB+AB SCR+CB...
ANTIBODY SCREEN: NEGATIVE
HEMATOCRIT: 37.8 % (ref 34.0–46.6)
HEMOGLOBIN: 12.1 g/dL (ref 11.1–15.9)
HIV Screen 4th Generation wRfx: NONREACTIVE
Hepatitis B Surface Ag: NEGATIVE
MCH: 31.3 pg (ref 26.6–33.0)
MCHC: 32 g/dL (ref 31.5–35.7)
MCV: 98 fL — AB (ref 79–97)
Platelets: 334 10*3/uL (ref 150–379)
RBC: 3.86 x10E6/uL (ref 3.77–5.28)
RDW: 13.4 % (ref 12.3–15.4)
RH TYPE: POSITIVE
RPR Ser Ql: NONREACTIVE
Rubella Antibodies, IGG: 1.31 index (ref >0.99)
Varicella zoster IgG: 693 index (ref >165)
WBC: 11 10*3/uL — ABNORMAL HIGH (ref 3.4–10.8)

## 2017-04-19 LAB — CULTURE, OB URINE

## 2017-04-19 LAB — URINE CULTURE, OB REFLEX: Organism ID, Bacteria: NO GROWTH

## 2017-04-19 LAB — GC/CHLAMYDIA PROBE AMP
Chlamydia trachomatis, NAA: NEGATIVE
NEISSERIA GONORRHOEAE BY PCR: NEGATIVE

## 2017-04-24 ENCOUNTER — Ambulatory Visit (INDEPENDENT_AMBULATORY_CARE_PROVIDER_SITE_OTHER): Payer: Medicaid Other

## 2017-04-24 ENCOUNTER — Ambulatory Visit (INDEPENDENT_AMBULATORY_CARE_PROVIDER_SITE_OTHER): Payer: Medicaid Other | Admitting: Advanced Practice Midwife

## 2017-04-24 VITALS — BP 96/52 | Wt 121.0 lb

## 2017-04-24 DIAGNOSIS — Z362 Encounter for other antenatal screening follow-up: Secondary | ICD-10-CM

## 2017-04-24 DIAGNOSIS — Z3481 Encounter for supervision of other normal pregnancy, first trimester: Secondary | ICD-10-CM | POA: Diagnosis not present

## 2017-04-24 DIAGNOSIS — Z3A11 11 weeks gestation of pregnancy: Secondary | ICD-10-CM

## 2017-04-24 NOTE — Progress Notes (Signed)
ROB U/S today 

## 2017-04-24 NOTE — Progress Notes (Signed)
  Routine Prenatal Care Visit  Subjective  Adrienne West is a 26 y.o. G3P2002 at 9020w1d being seen today for ongoing prenatal care.  She is currently monitored for the following issues for this low-risk pregnancy and has Supervision of normal pregnancy; Bipolar disorder (HCC); and Tobacco smoking affecting pregnancy in first trimester on her problem list.  ----------------------------------------------------------------------------------- Patient reports no complaints.   Denies leaking of fluid or cramping or bleeding  ----------------------------------------------------------------------------------- The following portions of the patient's history were reviewed and updated as appropriate: allergies, current medications, past family history, past medical history, past social history, past surgical history and problem list. Problem list updated.   Objective  Blood pressure (!) 96/52, weight 121 lb (54.9 kg), last menstrual period 02/05/2017, not currently breastfeeding. Pregravid weight Pregravid weight not on file Total Weight Gain Not found. Urinalysis: Urine Protein: Trace Urine Glucose: Negative  Fetal Status: Dating scan today agrees with LMP within 4 days  General:  Alert, oriented and cooperative. Patient is in no acute distress.  Skin: Skin is warm and dry. No rash noted.   Cardiovascular: Normal heart rate noted  Respiratory: Normal respiratory effort, no problems with respiration noted  Abdomen: Soft, gravid, appropriate for gestational age.       Pelvic:  Cervical exam deferred        Extremities: Normal range of motion.     Mental Status: Normal mood and affect. Normal behavior. Normal judgment and thought content.   Assessment   26 y.o. G3P2002 at 2120w1d by  11/12/2017, by Last Menstrual Period presenting for routine prenatal visit  Plan   pregnancy 3 Problems (from 02/05/17 to present)    Problem Noted Resolved   Supervision of normal pregnancy 04/17/2017 by Oswaldo ConroySchmid,  Jacelyn Y, CNM No   Overview Addendum 04/21/2017  9:52 AM by Oswaldo ConroySchmid, Jacelyn Y, CNM    Clinic [facility] Prenatal Labs  Dating  Blood type: O Pos  Genetic Screen 1 Screen:    AFP:     Quad:     NIPS: declined screens Antibody:Neg  Anatomic US  Rubella: Immune Varicella: Immune  GTT Early:               Third trimester:  RPR: NR  Rhogam  HBsAg: Neg  TDaP vaccine                       Flu Shot: HIV: NR  Baby Food                                GBS:   Contraception  Pap:  CBB     CS/VBAC    Support Person                  Preterm labor symptoms and general obstetric precautions including but not limited to vaginal bleeding, contractions, leaking of fluid and fetal movement were reviewed in detail with the patient.   Return in about 4 weeks (around 05/22/2017) for rob.  Tresea MallJane Milen Lengacher, CNM  04/24/2017 3:53 PM

## 2017-04-30 ENCOUNTER — Emergency Department
Admission: EM | Admit: 2017-04-30 | Discharge: 2017-04-30 | Disposition: A | Discharge status: Home / Self Care | Payer: Medicaid Other | Attending: Emergency Medicine | Admitting: Emergency Medicine

## 2017-04-30 ENCOUNTER — Encounter: Payer: Self-pay | Admitting: Emergency Medicine

## 2017-04-30 DIAGNOSIS — F17210 Nicotine dependence, cigarettes, uncomplicated: Secondary | ICD-10-CM | POA: Insufficient documentation

## 2017-04-30 DIAGNOSIS — Z331 Pregnant state, incidental: Secondary | ICD-10-CM | POA: Diagnosis not present

## 2017-04-30 DIAGNOSIS — Z79899 Other long term (current) drug therapy: Secondary | ICD-10-CM | POA: Diagnosis not present

## 2017-04-30 DIAGNOSIS — R197 Diarrhea, unspecified: Secondary | ICD-10-CM | POA: Diagnosis not present

## 2017-04-30 DIAGNOSIS — W1811XA Fall from or off toilet without subsequent striking against object, initial encounter: Secondary | ICD-10-CM | POA: Insufficient documentation

## 2017-04-30 DIAGNOSIS — Y929 Unspecified place or not applicable: Secondary | ICD-10-CM | POA: Insufficient documentation

## 2017-04-30 DIAGNOSIS — S0181XA Laceration without foreign body of other part of head, initial encounter: Secondary | ICD-10-CM | POA: Insufficient documentation

## 2017-04-30 DIAGNOSIS — Y999 Unspecified external cause status: Secondary | ICD-10-CM | POA: Diagnosis not present

## 2017-04-30 DIAGNOSIS — Y939 Activity, unspecified: Secondary | ICD-10-CM | POA: Diagnosis not present

## 2017-04-30 DIAGNOSIS — R55 Syncope and collapse: Secondary | ICD-10-CM

## 2017-04-30 LAB — BASIC METABOLIC PANEL
Anion gap: 7 (ref 5–15)
BUN: 8 mg/dL (ref 6–20)
CALCIUM: 8.8 mg/dL — AB (ref 8.9–10.3)
CHLORIDE: 108 mmol/L (ref 101–111)
CO2: 22 mmol/L (ref 22–32)
CREATININE: 0.49 mg/dL (ref 0.44–1.00)
GFR calc Af Amer: >60 mL/min (ref >60)
GFR calc non Af Amer: >60 mL/min (ref >60)
GLUCOSE: 82 mg/dL (ref 65–99)
Potassium: 3.9 mmol/L (ref 3.5–5.1)
Sodium: 137 mmol/L (ref 135–145)

## 2017-04-30 LAB — CBC WITH DIFFERENTIAL/PLATELET
Basophils Absolute: 0 10*3/uL (ref 0–0.1)
Basophils Relative: 0 %
Eosinophils Absolute: 0 10*3/uL (ref 0–0.7)
Eosinophils Relative: 0 %
HEMATOCRIT: 36 % (ref 35.0–47.0)
HEMOGLOBIN: 12.2 g/dL (ref 12.0–16.0)
LYMPHS ABS: 1.6 10*3/uL (ref 1.0–3.6)
Lymphocytes Relative: 13 %
MCH: 32.1 pg (ref 26.0–34.0)
MCHC: 33.9 g/dL (ref 32.0–36.0)
MCV: 94.8 fL (ref 80.0–100.0)
MONO ABS: 0.4 10*3/uL (ref 0.2–0.9)
MONOS PCT: 3 %
NEUTROS ABS: 10.7 10*3/uL — AB (ref 1.4–6.5)
NEUTROS PCT: 84 %
Platelets: 235 10*3/uL (ref 150–440)
RBC: 3.79 MIL/uL — ABNORMAL LOW (ref 3.80–5.20)
RDW: 13 % (ref 11.5–14.5)
WBC: 12.8 10*3/uL — ABNORMAL HIGH (ref 3.6–11.0)

## 2017-04-30 NOTE — ED Notes (Addendum)
Patient hit head when falling from toilet to floor, unknown if she hit her stomach. Patient stated she passed out prior to falling and she gets dizzy. Attempted to find fetal heart tones.

## 2017-04-30 NOTE — ED Provider Notes (Signed)
[facility] Emergency Department Provider Note   ____________________________________________   First MD Initiated Contact with Patient 04/30/17 0809     (approximate)  I have reviewed the triage vital signs and the nursing notes.   HISTORY  Chief Complaint Nausea and Fall    HPI Adrienne West is a 26 y.o. female with a history of anxiety as well as bipolar disorder who is presenting to the emergency department after syncopal episode. She says that she has had diarrhea this morning and then felt chills, dizziness and then passed out was sitting down the toilet. She says that she has had similar symptoms in the past without losing consciousness. She thinks she was out for about 30-45 seconds. Sustained a laceration to the right eyebrow. Thing that she is having mild to moderate aching headache over the area radiating to the back of her head at that site. Denies any nausea or vomiting. Denies any symptoms of chills or dizziness at this time. Says that she has been eating less and drinking less than normal lately as she is [redacted] weeks pregnant. Intermittently taking prenatal vitamins. Denies any abdominal pain and does not think she hit her abdomen when she fell. Denies any vaginal bleeding or discharge or urinary symptoms. The patient at [facility] side OB/GYN.Last tetanus shot in 2013.   Past Medical History:  Diagnosis Date  . Anxiety   . Bipolar disorder [facility])     Patient Active Problem List   Diagnosis Date Noted  . Supervision of normal pregnancy 04/17/2017  . Bipolar disorder (HCC) 04/17/2017  . Tobacco smoking affecting pregnancy in first trimester 04/17/2017    Past Surgical History:  Procedure Laterality Date  . aspiration of breast cyst Right 2013  . LAPAROSCOPIC APPENDECTOMY N/A 2012    Prior to Admission medications   Medication Sig Start Date End Date Taking? Authorizing Provider  acetaminophen (TYLENOL) 500 MG tablet Take 1,000 mg by mouth  every 6 (six) hours as needed.   Yes [provider]  Prenat-Methylfol-Chol-Fish Oil (PRENATAL + COMPLETE MULTI PO) Take 1 tablet by mouth daily.   Yes [provider]  ranitidine (ZANTAC) 75 MG tablet Take 75 mg by mouth daily.   Yes [provider]    Allergies Patient has no known allergies.  Family History  Problem Relation Age of Onset  . Bipolar disorder Mother   . Endometriosis Mother   . Breast cancer Mother 88  . Stroke Father   . Hyperlipidemia Father   . Hypertension Father   . Bipolar disorder Sister   . COPD Maternal Grandmother   . Hypothyroidism Maternal Grandmother   . Diabetes Maternal Grandfather   . Rheum arthritis Paternal Grandmother   . AAA (abdominal aortic aneurysm) Paternal Grandfather   . Stroke Paternal Grandfather     Social History Social History  Substance Use Topics  . Smoking status: Current Every Day Smoker    Packs/day: 0.75    Types: Cigarettes  . Smokeless tobacco: Former Neurosurgeon     Comment: Quit 3 days ago  . Alcohol use No    Review of Systems  Constitutional: No fever/chills Eyes: No visual changes. ENT: No sore throat. Cardiovascular: Denies chest pain. Respiratory: Denies shortness of breath. Gastrointestinal: No abdominal pain.  No nausea, no vomiting.  No diarrhea.  No constipation. Genitourinary: Negative for dysuria. Musculoskeletal: Negative for back pain. Skin: Negative for rash. Neurological: Negative for focal weakness or numbness.   ____________________________________________   PHYSICAL EXAM:  VITAL SIGNS: ED Triage Vitals  Enc Vitals Group     BP 04/30/17 0800 (!) 89/54     Pulse Rate 04/30/17 0800 81     Resp --      Temp 04/30/17 0800 98.1 F (36.7 C)     Temp Source 04/30/17 0800 Oral     SpO2 04/30/17 0800 99 %     Weight 04/30/17 0801 123 lb (55.8 kg)     Height 04/30/17 0801 5\' 2"  (1.575 m)     Head Circumference --      Peak Flow --      Pain Score 04/30/17 0759 7       Pain Loc --      Pain Edu? --      Excl. in GC? --     Constitutional: Alert and oriented. Well appearing and in no acute distress. Eyes: Conjunctivae are normal.  Head: 2 cm laceration of the right lateral eyebrow which is 2 the dermis. There is no active bleeding at this time. Laceration is linear. No depression or swelling over the area but there is a we'll of surrounding ecchymosis about 2-3 cm. Mild to moderate tenderness over this area. Nose: No congestion/rhinnorhea. Mouth/Throat: Mucous membranes are moist.  Neck: No stridor.   Cardiovascular: Normal rate, regular rhythm. Grossly normal heart sounds.  Respiratory: Normal respiratory effort.  No retractions. Lungs CTAB. Gastrointestinal: Soft and nontender. No distention. No CVA tenderness. Musculoskeletal: No lower extremity tenderness nor edema.  No joint effusions. Neurologic:  Normal speech and language. No gross focal neurologic deficits are appreciated. Skin:  Skin is warm, dry and intact. No rash noted. Psychiatric: Mood and affect are normal. Speech and behavior are normal.  ____________________________________________   LABS (all labs ordered are listed, but only abnormal results are displayed)  Labs Reviewed  CBC WITH DIFFERENTIAL/PLATELET - Abnormal; Notable for the following:       Result Value   WBC 12.8 (*)    RBC 3.79 (*)    Neutro Abs 10.7 (*)    All other components within normal limits  BASIC METABOLIC PANEL - Abnormal; Notable for the following:    Calcium 8.8 (*)    All other components within normal limits   ____________________________________________  EKG  ED ECG REPORT I, Arelia Longest, the attending physician, personally viewed and interpreted this ECG.   Date: 04/30/2017  EKG Time: 0854  Rate: 58  Rhythm: Bradycardia  Axis: Normal  Intervals:none  ST&T Change: No ST segment elevation or depression. No abnormal T-wave  inversions.  ____________________________________________  RADIOLOGY   ____________________________________________   PROCEDURES  Procedure(s) performed: None   LACERATION REPAIR Performed by: Arelia Longest Authorized by: Arelia Longest Consent: Verbal consent obtained. Risks and benefits: risks, benefits and alternatives were discussed Consent given by: patient Patient identity confirmed: provided demographic data Prepped and Draped in normal sterile fashion Wound explored  Laceration Location: right eyebrow  Laceration Length: 2cm  No Foreign Bodies seen or palpated   Irrigation method: syringe Amount of cleaning: standard  Skin closure: dermabond and Steri-Strips. Laceration came together with good cosmesis.  Patient tolerance: Patient tolerated the procedure well with no immediate complications.   Procedures  Critical Care performed: No  ____________________________________________   INITIAL IMPRESSION / ASSESSMENT AND PLAN / ED COURSE  Pertinent labs & imaging results that were available during my care of the patient were reviewed by me and considered in my medical decision making (see chart for  details).  DDX: Syncope, arrhythmia, anemia, hypotension, laceration  As part of my medical decision making, I reviewed the following data within the electronic MEDICAL RECORD NUMBER Notes from prior ED visits  ----------------------------------------- 10:13 AM on 04/30/2017 -----------------------------------------  Bedside ultrasound performed and fetal heart rates were 167. Patient at this time is asymptomatic. Reassuring workup. Elevated white blood cell count which appears chronic. Patient will be discharged home. We discussed treatment including fluids and following up with the OB/GYN. She is understanding of plan and willing to comply. Patient still with headache. However, without any neuro deficits. Patient appears well. We discussed possible  concussion symptoms are returning for any worsening or concerning symptoms. Patient is young and without evidence of coagulopathy. I do not believe a head CT is necessary at this time. Patient knows to return for worsening or concerning symptoms. Furthermore, the patient has no known sick contacts. Has not been on any antibiotics recently and has not any out of country travel. I will not further workup or diarrhea at this time.     ____________________________________________   FINAL CLINICAL IMPRESSION(S) / ED DIAGNOSES  Syncope. Head laceration. Diarrhea.    NEW MEDICATIONS STARTED DURING THIS VISIT:  New Prescriptions   No medications on file     Note:  This document was prepared using Dragon voice recognition software and may include unintentional dictation errors.     Myrna BlazerSchaevitz, Mardee Clune Matthew, MD 04/30/17 1016

## 2017-04-30 NOTE — ED Notes (Addendum)
Pt states is a G3L2, is exactly 12 weeks today. Pt c/o diarrhea today, had a syncopal episode while on the toilet. Pt presents with laceration to R eyebrow, bleeding controlled upon arrival to the room. Pt states is unsure if she hit her stomach but denies any discomfort in abdomen, denies any bleeding or discharge. Pt states BP from triage of 89/54 is her baseline. This RN and Vonna KotykJay, RN attempted to find fetal heart tones with doppler, [facility] were unable to find fetal heart tones with doppler, MD notified unable to find fetal heart tones. MD at bedside with US machine at this time.

## 2017-04-30 NOTE — ED Triage Notes (Signed)
Pt from home after falling. She was dizzy and fell, hitting her head. She has laceration to right temple and eyebrow area. Pt is [redacted] weeks pregnant; has had diarrhea this morning.

## 2017-04-30 NOTE — ED Notes (Signed)
Patient does not appear to be in any acute distress at time of discharge. Patient ambulatory to lobby with steady gate. Patient denies any comments or concerns regarding discharge.  

## 2017-05-22 ENCOUNTER — Ambulatory Visit (INDEPENDENT_AMBULATORY_CARE_PROVIDER_SITE_OTHER): Payer: Medicaid Other | Admitting: Advanced Practice Midwife

## 2017-05-22 ENCOUNTER — Encounter: Payer: Self-pay | Admitting: Advanced Practice Midwife

## 2017-05-22 VITALS — BP 100/60 | Wt 126.0 lb

## 2017-05-22 DIAGNOSIS — Z3480 Encounter for supervision of other normal pregnancy, unspecified trimester: Secondary | ICD-10-CM

## 2017-05-22 DIAGNOSIS — Z3A15 15 weeks gestation of pregnancy: Secondary | ICD-10-CM

## 2017-05-22 DIAGNOSIS — O99340 Other mental disorders complicating pregnancy, unspecified trimester: Secondary | ICD-10-CM

## 2017-05-22 DIAGNOSIS — F419 Anxiety disorder, unspecified: Secondary | ICD-10-CM

## 2017-05-22 MED ORDER — ESCITALOPRAM OXALATE 10 MG PO TABS: 10.0000 mg | ORAL_TABLET | Freq: Every day | ORAL | 3 refills | Status: DC

## 2017-05-22 NOTE — Progress Notes (Signed)
No vb. No lof.  

## 2017-05-22 NOTE — Progress Notes (Signed)
  Routine Prenatal Care Visit  Subjective  Adrienne West is a 26 y.o. G3P2002 at 7012w1d being seen today for ongoing prenatal care.  She is currently monitored for the following issues for this high-risk pregnancy and has Supervision of normal pregnancy; Bipolar disorder (HCC); and Tobacco smoking affecting pregnancy in first trimester on their problem list.  ----------------------------------------------------------------------------------- Patient reports increasing anxiety and requesting medication help.    . Vag. Bleeding: None.   . Denies leaking of fluid.  ----------------------------------------------------------------------------------- The following portions of the patient's history were reviewed and updated as appropriate: allergies, current medications, past family history, past medical history, past social history, past surgical history and problem list. Problem list updated.   Objective  Blood pressure 100/60, weight 126 lb (57.2 kg), last menstrual period 02/05/2017, not currently breastfeeding. Pregravid weight 118 lb (53.5 kg) Total Weight Gain 8 lb (3.629 kg) Urinalysis:      Fetal Status:           General:  Alert, oriented and cooperative. Patient is in no acute distress.  Skin: Skin is warm and dry. No rash noted.   Cardiovascular: Normal heart rate noted  Respiratory: Normal respiratory effort, no problems with respiration noted  Abdomen: Soft, gravid, appropriate for gestational age. Pain/Pressure: Absent     Pelvic:  Cervical exam deferred        Extremities: Normal range of motion.     Mental Status: Normal mood and affect. Normal behavior. Normal judgment and thought content.   Assessment   26 y.o. G3P2002 at 3412w1d by  11/12/2017, by Last Menstrual Period presenting for routine prenatal visit  Plan   pregnancy 3 Problems (from 02/05/17 to present)    Problem Noted Resolved   Supervision of normal pregnancy 04/17/2017 by Oswaldo ConroySchmid, Jacelyn Y, CNM No   Overview Addendum 04/21/2017  9:52 AM by Oswaldo ConroySchmid, Jacelyn Y, CNM    [facility]inic [facility] Prenatal Labs  Dating  Blood type: O Pos  Genetic Screen 1 Screen:    AFP:     Quad:     NIPS: Antibody:Neg  Anatomic US  Rubella: Immune Varicella: Immune  GTT Early:               Third trimester:  RPR: NR  Rhogam  HBsAg: Neg  TDaP vaccine                       Flu Shot: HIV: NR  Baby Food                                GBS:   Contraception  Pap:  CBB     CS/VBAC    Support Person                  Preterm labor symptoms and general obstetric precautions including but not limited to vaginal bleeding, contractions, leaking of fluid and fetal movement were reviewed in detail with the patient. Please refer to After Visit Summary for other counseling recommendations.   Rx Lexapro 10 mg q day, discussion of healthy lifestyle, encouraged cessation of tobacco use  Return in about 4 weeks (around 06/19/2017) for anatomy scan and rob.  Tresea MallJane Hagen Tidd, CNM  05/22/2017 9:37 AM

## 2017-05-22 NOTE — Patient Instructions (Signed)

## 2017-06-11 ENCOUNTER — Telehealth: Payer: Self-pay

## 2017-06-11 NOTE — Telephone Encounter (Signed)
LMVM. Notified if virus, not treated w/abx. It will run it's course. Can treat s&s w/appropriate OTC meds as listed on Safe Meds in pregnancy list. Pt advised to contact us w/further ?'s/concerns or to report to ED if unable to keep food/fluids down for more than 24 hours.

## 2017-06-11 NOTE — Telephone Encounter (Signed)
Pt states she woke up this a.m. W/all the s&s of [place]o Virus. Pt inquiring how she needs to treat this since she is 18 wk ob. Cb#[phone]

## 2017-06-17 ENCOUNTER — Telehealth: Payer: Self-pay

## 2017-06-17 NOTE — Telephone Encounter (Signed)
Pt went to [facility] seen dr there they gave her amoxicillin for cough, cold, symptoms pt states she did get Robitussin to help with cough, pt has a appt with [facility] on the 13th will f/up then other wise pt doing well

## 2017-06-19 ENCOUNTER — Encounter: Payer: Self-pay | Admitting: Obstetrics and Gynecology

## 2017-06-19 ENCOUNTER — Ambulatory Visit (INDEPENDENT_AMBULATORY_CARE_PROVIDER_SITE_OTHER): Payer: Medicaid Other | Admitting: Obstetrics and Gynecology

## 2017-06-19 ENCOUNTER — Ambulatory Visit (INDEPENDENT_AMBULATORY_CARE_PROVIDER_SITE_OTHER): Payer: Medicaid Other

## 2017-06-19 VITALS — BP 102/64 | Wt 127.0 lb

## 2017-06-19 DIAGNOSIS — O99331 Smoking (tobacco) complicating pregnancy, first trimester: Secondary | ICD-10-CM

## 2017-06-19 DIAGNOSIS — Z3480 Encounter for supervision of other normal pregnancy, unspecified trimester: Secondary | ICD-10-CM | POA: Diagnosis not present

## 2017-06-19 DIAGNOSIS — F3170 Bipolar disorder, currently in remission, most recent episode unspecified: Secondary | ICD-10-CM

## 2017-06-19 DIAGNOSIS — Z362 Encounter for other antenatal screening follow-up: Secondary | ICD-10-CM

## 2017-06-19 DIAGNOSIS — Z3A19 19 weeks gestation of pregnancy: Secondary | ICD-10-CM

## 2017-06-19 DIAGNOSIS — Z3482 Encounter for supervision of other normal pregnancy, second trimester: Secondary | ICD-10-CM

## 2017-06-19 NOTE — Progress Notes (Signed)
  Routine Prenatal Care Visit  Subjective  Adrienne West is a 26 y.o. G3P2002 at 171w1d being seen today for ongoing prenatal care.  She is currently monitored for the following issues for this low-risk pregnancy and has Supervision of normal pregnancy; Bipolar disorder (HCC); and Tobacco smoking affecting pregnancy in first trimester on their problem list.  ----------------------------------------------------------------------------------- Patient reports no complaints.    . Vag. Bleeding: None.   . Denies leaking of fluid.  ANatomy u/s complete today.  ----------------------------------------------------------------------------------- The following portions of the patient's history were reviewed and updated as appropriate: allergies, current medications, past family history, past medical history, past social history, past surgical history and problem list. Problem list updated. Objective  Blood pressure 102/64, weight 127 lb (57.6 kg), last menstrual period 02/05/2017, not currently breastfeeding. Pregravid weight 118 lb (53.5 kg) Total Weight Gain 9 lb (4.082 kg) Urinalysis: Urine Protein: Negative Urine Glucose: Negative  Fetal Status: Fetal Heart Rate (bpm): Present         General:  Alert, oriented and cooperative. Patient is in no acute distress.  Skin: Skin is warm and dry. No rash noted.   Cardiovascular: Normal heart rate noted  Respiratory: Normal respiratory effort, no problems with respiration noted  Abdomen: Soft, gravid, appropriate for gestational age. Pain/Pressure: Absent     Pelvic:  Cervical exam deferred        Extremities: Normal range of motion.     Mental Status: Normal mood and affect. Normal behavior. Normal judgment and thought content.   Assessment   26 y.o. G3P2002 at 6271w1d by  11/12/2017, by Last Menstrual Period presenting for routine prenatal visit  Plan   pregnancy 3 Problems (from 02/05/17 to present)    Problem Noted Resolved   Supervision of  normal pregnancy 04/17/2017 by Oswaldo ConroySchmid, Jacelyn Y, CNM No   Overview Addendum 04/21/2017  9:52 AM by Oswaldo ConroySchmid, Jacelyn Y, CNM    [facility] Prenatal Labs  Dating  Blood type: O Pos  Genetic Screen 1 Screen:    AFP:     Quad:     NIPS: Antibody:Neg  Anatomic US  Rubella: Immune Varicella: Immune  GTT Early:               Third trimester:  RPR: NR  Rhogam  HBsAg: Neg  TDaP vaccine                       Flu Shot: HIV: NR  Baby Food                                GBS:   Contraception  Pap:  CBB     CS/VBAC    Support Person                Preterm labor symptoms and general obstetric precautions including but not limited to vaginal bleeding, contractions, leaking of fluid and fetal movement were reviewed in detail with the patient. Please refer to After Visit Summary for other counseling recommendations.   Return in about 4 weeks (around 07/17/2017) for Routine Prenatal Appointment.  Thomasene MohairStephen Arisbel Maione, MD  06/19/2017 10:31 AM

## 2017-07-18 ENCOUNTER — Ambulatory Visit (INDEPENDENT_AMBULATORY_CARE_PROVIDER_SITE_OTHER): Payer: Medicaid Other | Admitting: Obstetrics and Gynecology

## 2017-07-18 VITALS — BP 102/62 | Wt 126.0 lb

## 2017-07-18 DIAGNOSIS — O99331 Smoking (tobacco) complicating pregnancy, first trimester: Secondary | ICD-10-CM

## 2017-07-18 DIAGNOSIS — Z3482 Encounter for supervision of other normal pregnancy, second trimester: Secondary | ICD-10-CM

## 2017-07-18 DIAGNOSIS — M25511 Pain in right shoulder: Secondary | ICD-10-CM

## 2017-07-18 DIAGNOSIS — F3170 Bipolar disorder, currently in remission, most recent episode unspecified: Secondary | ICD-10-CM

## 2017-07-18 DIAGNOSIS — Z3A23 23 weeks gestation of pregnancy: Secondary | ICD-10-CM

## 2017-07-18 DIAGNOSIS — O26842 Uterine size-date discrepancy, second trimester: Secondary | ICD-10-CM

## 2017-07-18 NOTE — Patient Instructions (Signed)
[email or web address]

## 2017-07-18 NOTE — Progress Notes (Signed)
     Routine Prenatal Care Visit  Subjective  Adrienne West is a 27 y.o. G3P2002 at 731w2d being seen today for ongoing prenatal care.  She is currently monitored for the following issues for this low-risk pregnancy and has Supervision of normal pregnancy; Bipolar disorder (HCC); and Tobacco smoking affecting pregnancy in first trimester on their problem list.  ----------------------------------------------------------------------------------- Patient reports pain in right should and feeling very tired.  She would like to get a flu shot but currently out of stock.  Contractions: Not present. Vag. Bleeding: None.  Movement: Present. Denies leaking of fluid.  ----------------------------------------------------------------------------------- The following portions of the patient's history were reviewed and updated as appropriate: allergies, current medications, past family history, past medical history, past social history, past surgical history and problem list. Problem list updated.   Objective  Blood pressure 102/62, weight 126 lb (57.2 kg), last menstrual period 02/05/2017, not currently breastfeeding. Pregravid weight 118 lb (53.5 kg) Total Weight Gain 8 lb (3.629 kg) Urinalysis: Urine Protein: Negative Urine Glucose: Negative  Fetal Status: Fetal Heart Rate (bpm): 150   Movement: Present     General:  Alert, oriented and cooperative. Patient is in no acute distress.  Skin: Skin is warm and dry. No rash noted.   Cardiovascular: Normal heart rate noted  Respiratory: Normal respiratory effort, no problems with respiration noted  Abdomen: Soft, gravid, appropriate for gestational age. Pain/Pressure: Absent     Pelvic:  Cervical exam deferred        Extremities: Normal range of motion.     ental Status: Normal mood and affect. Normal behavior. Normal judgment and thought content.     Assessment   27 y.o. G3P2002 at 9031w2d by  11/12/2017, by Last Menstrual Period presenting for  routine prenatal visit  Plan   pregnancy 3 Problems (from 02/05/17 to present)    Problem Noted Resolved   Supervision of normal pregnancy 04/17/2017 by Oswaldo ConroySchmid, Jacelyn Y, CNM No   Overview Addendum 04/21/2017  9:52 AM by Oswaldo ConroySchmid, Jacelyn Y, CNM    [facility] Prenatal Labs  Dating  Blood type: O Pos  Genetic Screen 1 Screen:    AFP:     Quad:     NIPS: Antibody:Neg  Anatomic US  normal Rubella: Immune Varicella: Immune  GTT Early:               Third trimester:  RPR: NR  Rhogam  Not needed HBsAg: Neg  TDaP vaccine                       Flu Shot: HIV: NR  Baby Food  Breast                              GBS:   Contraception  Depo &/or Nexplano Pap:  CBB   Given Info   CS/VBAC    Support Person                  Preterm labor symptoms and general obstetric precautions including but not limited to vaginal bleeding, contractions, leaking of fluid and fetal movement were reviewed in detail with the patient. Please refer to After Visit Summary for other counseling recommendations.   Uterine size less than date, Growth US ordered Shoulder injury, will refer to orthopedics for evaluation.  Return in about 4 weeks (around 08/15/2017) for Growth US and ROB.

## 2017-07-24 ENCOUNTER — Other Ambulatory Visit: Payer: Self-pay

## 2017-07-29 ENCOUNTER — Telehealth: Payer: Self-pay

## 2017-07-29 NOTE — Telephone Encounter (Signed)
Pt called triage line stating both of her kids have pink eye, she is [redacted] weeks pregnant. She thinks she is starting to get it. Does she need an appointment or can something be called in. [phone]   Advised patient, since it is not pregnancy related it would be best to see her PCP or urgent care. She states she has pregnancy only medicaid. Advised her to ask at the pharmacy to see what they recommend otc. If she wasn't going to PCP. She voiced an understanding

## 2017-08-15 ENCOUNTER — Ambulatory Visit (INDEPENDENT_AMBULATORY_CARE_PROVIDER_SITE_OTHER): Payer: Medicaid Other | Admitting: Obstetrics and Gynecology

## 2017-08-15 ENCOUNTER — Encounter: Payer: Self-pay | Admitting: Obstetrics and Gynecology

## 2017-08-15 ENCOUNTER — Ambulatory Visit (INDEPENDENT_AMBULATORY_CARE_PROVIDER_SITE_OTHER): Payer: Medicaid Other

## 2017-08-15 VITALS — BP 106/60 | Wt 126.0 lb

## 2017-08-15 DIAGNOSIS — O99331 Smoking (tobacco) complicating pregnancy, first trimester: Secondary | ICD-10-CM

## 2017-08-15 DIAGNOSIS — M545 Low back pain: Secondary | ICD-10-CM | POA: Diagnosis not present

## 2017-08-15 DIAGNOSIS — R300 Dysuria: Secondary | ICD-10-CM

## 2017-08-15 DIAGNOSIS — Z3A27 27 weeks gestation of pregnancy: Secondary | ICD-10-CM

## 2017-08-15 DIAGNOSIS — O26842 Uterine size-date discrepancy, second trimester: Secondary | ICD-10-CM

## 2017-08-15 DIAGNOSIS — Z131 Encounter for screening for diabetes mellitus: Secondary | ICD-10-CM

## 2017-08-15 DIAGNOSIS — Z113 Encounter for screening for infections with a predominantly sexual mode of transmission: Secondary | ICD-10-CM

## 2017-08-15 DIAGNOSIS — Z3483 Encounter for supervision of other normal pregnancy, third trimester: Secondary | ICD-10-CM

## 2017-08-15 LAB — POCT URINALYSIS DIPSTICK
BILIRUBIN UA: NEGATIVE
Glucose, UA: NEGATIVE
KETONES UA: NEGATIVE
Leukocytes, UA: NEGATIVE
Nitrite, UA: NEGATIVE
PH UA: 7.5 (ref 5.0–8.0)
Protein, UA: NEGATIVE
RBC UA: POSITIVE
SPEC GRAV UA: 1.02 (ref 1.010–1.025)
UROBILINOGEN UA: NEGATIVE U/dL — AB

## 2017-08-15 NOTE — Progress Notes (Signed)
Routine Prenatal Care Visit  Subjective  Adrienne West is a 27 y.o. G3P2002 at [redacted]w[redacted]d being seen today for ongoing prenatal care.  She is currently monitored for the following issues for this low-risk pregnancy and has Supervision of normal pregnancy; Bipolar disorder (HCC); and Tobacco smoking affecting pregnancy in first trimester on their problem list.  ----------------------------------------------------------------------------------- Patient reports backache.  Located in her lower back at the junction of her iliac crest and Paraspinous muscles. No CVAT.  Contractions: Not present. Vag. Bleeding: None.  Movement: Present. Denies leaking of fluid.  U/s showed enlarged kidneys? Fluid in bowel. Otherwise  Growth normal, AFI normal Need 28 week labs ----------------------------------------------------------------------------------- The following portions of the patient's history were reviewed and updated as appropriate: allergies, current medications, past family history, past medical history, past social history, past surgical history and problem list. Problem list updated.   Objective  Blood pressure 106/60, weight 126 lb (57.2 kg), last menstrual period 02/05/2017, not currently breastfeeding. Pregravid weight 118 lb (53.5 kg) Total Weight Gain 8 lb (3.629 kg) Urinalysis: Urine Protein: Negative Urine Glucose: Negative  Fetal Status: Fetal Heart Rate (bpm): Present   Movement: Present     General:  Alert, oriented and cooperative. Patient is in no acute distress.  Skin: Skin is warm and dry. No rash noted.   Cardiovascular: Normal heart rate noted  Respiratory: Normal respiratory effort, no problems with respiration noted  Abdomen: Soft, gravid, appropriate for gestational age. Pain/Pressure: Absent , no CVAT  Pelvic:  Cervical exam deferred        Extremities: Normal range of motion.     Mental Status: Normal mood and affect. Normal behavior. Normal judgment and thought content.    Assessment   27 y.o. G3P2002 at [redacted]w[redacted]d by  11/12/2017, by Last Menstrual Period presenting for routine prenatal visit  Plan   pregnancy 3 Problems (from 02/05/17 to present)    Problem Noted Resolved   Supervision of normal pregnancy 04/17/2017 by Oswaldo Conroy, CNM No   Overview Addendum 07/18/2017  5:16 PM by Natale Milch, MD    [facility] Prenatal Labs  Dating  Blood type: O Pos  Genetic Screen 1 Screen:    AFP:     Quad:     NIPS: Antibody:Neg  Anatomic [place]  Rubella: Immune Varicella: Immune  GTT Early:               Third trimester:  RPR: NR  Rhogam  HBsAg: Neg  TDaP vaccine                       Flu Shot: HIV: NR  Baby Food    Breast                            GBS:   Contraception Depo &/or Nexplanon Pap:  CBB  Given info   CS/VBAC Not applicable   Support Person                 Preterm labor symptoms and general obstetric precautions including but not limited to vaginal bleeding, contractions, leaking of fluid and fetal movement were reviewed in detail with the patient. Please refer to After Visit Summary for other counseling recommendations.   -Refer to [facility] MFM for u/s to assess kidneys/bowel, etc - schedule 28 week labs in ~1 week - Urine culture sent for back pain due to +blood on dip.    Return  in about 1 week (around 08/22/2017) for schedule 28 week labs and Routine Prenatal Appointment.  Thomasene MohairStephen Hershel Corkery, MD  08/15/2017 11:02 AM

## 2017-08-17 LAB — URINE CULTURE: ORGANISM ID, BACTERIA: NO GROWTH

## 2017-08-18 ENCOUNTER — Other Ambulatory Visit: Payer: Self-pay | Admitting: Obstetrics and Gynecology

## 2017-08-18 DIAGNOSIS — Z3689 Encounter for other specified antenatal screening: Secondary | ICD-10-CM

## 2017-08-18 NOTE — Progress Notes (Signed)
Hi Kasey, Can you please make sure this patient see's [facility] perin[facility] for these US findings.

## 2017-08-22 ENCOUNTER — Ambulatory Visit (INDEPENDENT_AMBULATORY_CARE_PROVIDER_SITE_OTHER): Payer: Medicaid Other | Admitting: Maternal Newborn

## 2017-08-22 ENCOUNTER — Other Ambulatory Visit: Payer: Medicaid Other

## 2017-08-22 ENCOUNTER — Encounter: Payer: Self-pay | Admitting: Maternal Newborn

## 2017-08-22 VITALS — BP 90/60 | Wt 126.0 lb

## 2017-08-22 DIAGNOSIS — Z3483 Encounter for supervision of other normal pregnancy, third trimester: Secondary | ICD-10-CM

## 2017-08-22 DIAGNOSIS — Z3A28 28 weeks gestation of pregnancy: Secondary | ICD-10-CM

## 2017-08-22 DIAGNOSIS — Z113 Encounter for screening for infections with a predominantly sexual mode of transmission: Secondary | ICD-10-CM

## 2017-08-22 DIAGNOSIS — Z131 Encounter for screening for diabetes mellitus: Secondary | ICD-10-CM

## 2017-08-22 NOTE — Patient Instructions (Signed)
Third Trimester of Pregnancy The third trimester is from week 28 through week 40 (months 7 through 9). The third trimester is a time when the unborn baby (fetus) is growing rapidly. At the end of the ninth month, the fetus is about 20 inches in length and weighs 6-10 pounds. Body changes during your third trimester Your body will continue to go through many changes during pregnancy. The changes vary from woman to woman. During the third trimester:  Your weight will continue to increase. You can expect to gain 25-35 pounds (11-16 kg) by the end of the pregnancy.  You may begin to get stretch marks on your hips, abdomen, and breasts.  You may urinate more often because the fetus is moving lower into your pelvis and pressing on your bladder.  You may develop or continue to have heartburn. This is caused by increased hormones that slow down muscles in the digestive tract.  You may develop or continue to have constipation because increased hormones slow digestion and cause the muscles that push waste through your intestines to relax.  You may develop hemorrhoids. These are swollen veins (varicose veins) in the rectum that can itch or be painful.  You may develop swollen, bulging veins (varicose veins) in your legs.  You may have increased body aches in the pelvis, back, or thighs. This is due to weight gain and increased hormones that are relaxing your joints.  You may have changes in your hair. These can include thickening of your hair, rapid growth, and changes in texture. Some women also have hair loss during or after pregnancy, or hair that feels dry or thin. Your hair will most likely return to normal after your baby is born.  Your breasts will continue to grow and they will continue to become tender. A yellow fluid (colostrum) may leak from your breasts. This is the first milk you are producing for your baby.  Your belly button may stick out.  You may notice more swelling in your hands,  face, or ankles.  You may have increased tingling or numbness in your hands, arms, and legs. The skin on your belly may also feel numb.  You may feel short of breath because of your expanding uterus.  You may have more problems sleeping. This can be caused by the size of your belly, increased need to urinate, and an increase in your body's metabolism.  You may notice the fetus "dropping," or moving lower in your abdomen (lightening).  You may have increased vaginal discharge.  You may notice your joints feel loose and you may have pain around your pelvic bone.  What to expect at prenatal visits You will have prenatal exams every 2 weeks until week 36. Then you will have weekly prenatal exams. During a routine prenatal visit:  You will be weighed to make sure you and the baby are growing normally.  Your blood pressure will be taken.  Your abdomen will be measured to track your baby's growth.  The fetal heartbeat will be listened to.  Any test results from the previous visit will be discussed.  You may have a cervical check near your due date to see if your cervix has softened or thinned (effaced).  You will be tested for Group B streptococcus. This happens between 35 and 37 weeks.  Your health care provider may ask you:  What your birth plan is.  How you are feeling.  If you are feeling the baby move.  If you have had   any abnormal symptoms, such as leaking fluid, bleeding, severe headaches, or abdominal cramping.  If you are using any tobacco products, including cigarettes, chewing tobacco, and electronic cigarettes.  If you have any questions.  Other tests or screenings that may be performed during your third trimester include:  Blood tests that check for low iron levels (anemia).  Fetal testing to check the health, activity level, and growth of the fetus. Testing is done if you have certain medical conditions or if there are problems during the  pregnancy.  Nonstress test (NST). This test checks the health of your baby to make sure there are no signs of problems, such as the baby not getting enough oxygen. During this test, a belt is placed around your belly. The baby is made to move, and its heart rate is monitored during movement.  What is false labor? False labor is a condition in which you feel small, irregular tightenings of the muscles in the womb (contractions) that usually go away with rest, changing position, or drinking water. These are called Braxton Hicks contractions. Contractions may last for hours, days, or even weeks before true labor sets in. If contractions come at regular intervals, become more frequent, increase in intensity, or become painful, you should see your health care provider. What are the signs of labor?  Abdominal cramps.  Regular contractions that start at 10 minutes apart and become stronger and more frequent with time.  Contractions that start on the top of the uterus and spread down to the lower abdomen and back.  Increased pelvic pressure and dull back pain.  A watery or bloody mucus discharge that comes from the vagina.  Leaking of amniotic fluid. This is also known as your "water breaking." It could be a slow trickle or a gush. Let your health care provider know if it has a color or strange odor. If you have any of these signs, call your health care provider right away, even if it is before your due date. Follow these instructions at home: Medicines  Follow your health care provider's instructions regarding medicine use. Specific medicines may be either safe or unsafe to take during pregnancy.  Take a prenatal vitamin that contains at least 600 micrograms (mcg) of folic acid.  If you develop constipation, try taking a stool softener if your health care provider approves. Eating and drinking  Eat a balanced diet that includes fresh fruits and vegetables, whole grains, good sources of protein  such as meat, eggs, or tofu, and low-fat dairy. Your health care provider will help you determine the amount of weight gain that is right for you.  Avoid raw meat and uncooked cheese. These carry germs that can cause birth defects in the baby.  If you have low calcium intake from food, talk to your health care provider about whether you should take a daily calcium supplement.  Eat four or five small meals rather than three large meals a day.  Limit foods that are high in fat and processed sugars, such as fried and sweet foods.  To prevent constipation: ? Drink enough fluid to keep your urine clear or pale yellow. ? Eat foods that are high in fiber, such as fresh fruits and vegetables, whole grains, and beans. Activity  Exercise only as directed by your health care provider. Most women can continue their usual exercise routine during pregnancy. Try to exercise for 30 minutes at least 5 days a week. Stop exercising if you experience uterine contractions.  Avoid heavy   lifting.  Do not exercise in extreme heat or humidity, or at high altitudes.  Wear low-heel, comfortable shoes.  Practice good posture.  You may continue to have sex unless your health care provider tells you otherwise. Relieving pain and discomfort  Take frequent breaks and rest with your legs elevated if you have leg cramps or low back pain.  Take warm sitz baths to soothe any pain or discomfort caused by hemorrhoids. Use hemorrhoid cream if your health care provider approves.  Wear a good support bra to prevent discomfort from breast tenderness.  If you develop varicose veins: ? Wear support pantyhose or compression stockings as told by your healthcare provider. ? Elevate your feet for 15 minutes, 3-4 times a day. Prenatal care  Write down your questions. Take them to your prenatal visits.  Keep all your prenatal visits as told by your health care provider. This is important. Safety  Wear your seat belt at  all times when driving.  Make a list of emergency phone numbers, including numbers for family, friends, the hospital, and police and fire departments. General instructions  Avoid cat litter boxes and soil used by cats. These carry germs that can cause birth defects in the baby. If you have a cat, ask someone to clean the litter box for you.  Do not travel far distances unless it is absolutely necessary and only with the approval of your health care provider.  Do not use hot tubs, steam rooms, or saunas.  Do not drink alcohol.  Do not use any products that contain nicotine or tobacco, such as cigarettes and e-cigarettes. If you need help quitting, ask your health care provider.  Do not use any medicinal herbs or unprescribed drugs. These chemicals affect the formation and growth of the baby.  Do not douche or use tampons or scented sanitary pads.  Do not cross your legs for long periods of time.  To prepare for the arrival of your baby: ? Take prenatal classes to understand, practice, and ask questions about labor and delivery. ? Make a trial run to the hospital. ? Visit the hospital and tour the maternity area. ? Arrange for maternity or paternity leave through employers. ? Arrange for family and friends to take care of pets while you are in the hospital. ? Purchase a rear-facing car seat and make sure you know how to install it in your car. ? Pack your hospital bag. ? Prepare the baby's nursery. Make sure to remove all pillows and stuffed animals from the baby's crib to prevent suffocation.  Visit your dentist if you have not gone during your pregnancy. Use a soft toothbrush to brush your teeth and be gentle when you floss. Contact a health care provider if:  You are unsure if you are in labor or if your water has broken.  You become dizzy.  You have mild pelvic cramps, pelvic pressure, or nagging pain in your abdominal area.  You have lower back pain.  You have persistent  nausea, vomiting, or diarrhea.  You have an unusual or bad smelling vaginal discharge.  You have pain when you urinate. Get help right away if:  Your water breaks before 37 weeks.  You have regular contractions less than 5 minutes apart before 37 weeks.  You have a fever.  You are leaking fluid from your vagina.  You have spotting or bleeding from your vagina.  You have severe abdominal pain or cramping.  You have rapid weight loss or weight gain.    You have shortness of breath with chest pain.  You notice sudden or extreme swelling of your face, hands, ankles, feet, or legs.  Your baby makes fewer than 10 movements in 2 hours.  You have severe headaches that do not go away when you take medicine.  You have vision changes. Summary  The third trimester is from week 28 through week 40, months 7 through 9. The third trimester is a time when the unborn baby (fetus) is growing rapidly.  During the third trimester, your discomfort may increase as you and your baby continue to gain weight. You may have abdominal, leg, and back pain, sleeping problems, and an increased need to urinate.  During the third trimester your breasts will keep growing and they will continue to become tender. A yellow fluid (colostrum) may leak from your breasts. This is the first milk you are producing for your baby.  False labor is a condition in which you feel small, irregular tightenings of the muscles in the womb (contractions) that eventually go away. These are called Braxton Hicks contractions. Contractions may last for hours, days, or even weeks before true labor sets in.  Signs of labor can include: abdominal cramps; regular contractions that start at 10 minutes apart and become stronger and more frequent with time; watery or bloody mucus discharge that comes from the vagina; increased pelvic pressure and dull back pain; and leaking of amniotic fluid. This information is not intended to replace advice  given to you by your health care provider. Make sure you discuss any questions you have with your health care provider. Document Released: 06/18/2001 Document Revised: 11/30/2015 Document Reviewed: 08/25/2012 Elsevier Interactive Patient Education  2017 [employer]  

## 2017-08-22 NOTE — Progress Notes (Signed)
    Routine Prenatal Care Visit  Subjective  Adrienne West is a 27 West.o. G3P2002 at 8375w2d being seen today for ongoing prenatal care.  She is currently monitored for the following issues for this low-risk pregnancy and has Supervision of normal pregnancy; Bipolar disorder (HCC); and Tobacco smoking affecting pregnancy in first trimester on their problem list.  ----------------------------------------------------------------------------------- Patient reports some frequent Braxton-Hicks yesterday while busy and on her feet. They resolved with rest and hydration. Contractions: Not present. Vag. Bleeding: None.  Movement: Present. Denies leaking of fluid.  ----------------------------------------------------------------------------------- The following portions of the patient's history were reviewed and updated as appropriate: allergies, current medications, past family history, past medical history, past social history, past surgical history and problem list. Problem list updated.   Objective  Last menstrual period 02/05/2017, not currently breastfeeding. Pregravid weight 118 lb (53.5 kg) Total Weight Gain 8 lb (3.629 kg) Urinalysis: Urine Protein: Trace Urine Glucose: Negative  Fetal Status: Fetal Heart Rate (bpm): 148 Fundal Height: 24 cm Movement: Present     General:  Alert, oriented and cooperative. Patient is in no acute distress.  Skin: Skin is warm and dry. No rash noted.   Cardiovascular: Normal heart rate noted  Respiratory: Normal respiratory effort, no problems with respiration noted  Abdomen: Soft, gravid, appropriate for gestational age. Pain/Pressure: Absent     Pelvic:  Cervical exam deferred        Extremities: Normal range of motion.     Mental Status: Normal mood and affect. Normal behavior. Normal judgment and thought content.     Assessment   27 West.o. [id number] at 7575w2d, EDD 11/12/2017 by Last Menstrual Period presenting for routine prenatal visit.  Plan    pregnancy 3 Problems (from 02/05/17 to present)    Problem Noted Resolved   Supervision of normal pregnancy 04/17/2017 by Oswaldo ConroySchmid, Adrienne West, CNM No   Overview Addendum 07/18/2017  5:16 PM by Natale MilchSchuman, Adrienne R, MD    Clinic [facility] Prenatal Labs  Dating  Blood type: O Pos  Genetic Screen 1 Screen:    AFP:     Quad:     NIPS: Antibody:Neg  Anatomic US  Rubella: Immune Varicella: Immune  GTT Early:               Third trimester:  RPR: NR  Rhogam  HBsAg: Neg  TDaP vaccine                       Flu Shot: HIV: NR  Baby Food    Breast                            GBS:   Contraception Depo &/or Nexplanon Pap:  CBB  Given info   CS/VBAC Not applicable   Support Person               GTT and 28 week labs today. Appointment scheduled next week with [facility] for ultrasound/consult to follow up on findings from ultrasound here.  Preterm labor symptoms and general obstetric precautions including but not limited to vaginal bleeding, contractions, leaking of fluid and fetal movement were reviewed in detail with the patient.  Please refer to After Visit Summary for other counseling recommendations.   Return in about 2 weeks (around 09/05/2017) for ROB.  Marcelyn BruinsJacelyn West, CNM 08/22/2017  10:17 AM

## 2017-08-23 LAB — 28 WEEK RH+PANEL
BASOS: 0 %
Basophils Absolute: 0 10*3/uL (ref 0.0–0.2)
EOS (ABSOLUTE): 0.1 10*3/uL (ref 0.0–0.4)
Eos: 0 %
Gestational Diabetes Screen: 89 mg/dL (ref 65–139)
HEMOGLOBIN: 11.2 g/dL (ref 11.1–15.9)
HIV SCREEN 4TH GENERATION: NONREACTIVE
Hematocrit: 33.2 % — ABNORMAL LOW (ref 34.0–46.6)
IMMATURE GRANS (ABS): 0 10*3/uL (ref 0.0–0.1)
Immature Granulocytes: 0 %
LYMPHS: 19 %
Lymphocytes Absolute: 2.6 10*3/uL (ref 0.7–3.1)
MCH: 32.7 pg (ref 26.6–33.0)
MCHC: 33.7 g/dL (ref 31.5–35.7)
MCV: 97 fL (ref 79–97)
MONOCYTES: 3 %
Monocytes Absolute: 0.4 10*3/uL (ref 0.1–0.9)
Neutrophils Absolute: 10.8 10*3/uL — ABNORMAL HIGH (ref 1.4–7.0)
Neutrophils: 78 %
Platelets: 281 10*3/uL (ref 150–379)
RBC: 3.43 x10E6/uL — ABNORMAL LOW (ref 3.77–5.28)
RDW: 13.2 % (ref 12.3–15.4)
RPR Ser Ql: NONREACTIVE
WBC: 13.9 10*3/uL — ABNORMAL HIGH (ref 3.4–10.8)

## 2017-08-28 ENCOUNTER — Ambulatory Visit
Admission: RE | Admit: 2017-08-28 | Discharge: 2017-08-28 | Disposition: A | Discharge status: Home / Self Care | Payer: Medicaid Other | Source: Ambulatory Visit | Attending: Obstetrics & Gynecology | Admitting: Obstetrics & Gynecology

## 2017-08-28 ENCOUNTER — Other Ambulatory Visit: Payer: Self-pay | Admitting: *Deleted

## 2017-08-28 VITALS — BP 105/58 | HR 92 | Temp 98.0°F | Resp 18 | Ht 62.4 in | Wt 126.1 lb

## 2017-08-28 DIAGNOSIS — Z3689 Encounter for other specified antenatal screening: Secondary | ICD-10-CM

## 2017-08-28 DIAGNOSIS — Z3483 Encounter for supervision of other normal pregnancy, third trimester: Secondary | ICD-10-CM

## 2017-08-28 DIAGNOSIS — O365930 Maternal care for other known or suspected poor fetal growth, third trimester, not applicable or unspecified: Secondary | ICD-10-CM | POA: Diagnosis present

## 2017-08-28 DIAGNOSIS — O365990 Maternal care for other known or suspected poor fetal growth, unspecified trimester, not applicable or unspecified: Secondary | ICD-10-CM | POA: Insufficient documentation

## 2017-08-28 DIAGNOSIS — Z3A29 29 weeks gestation of pregnancy: Secondary | ICD-10-CM | POA: Diagnosis not present

## 2017-09-01 ENCOUNTER — Ambulatory Visit (INDEPENDENT_AMBULATORY_CARE_PROVIDER_SITE_OTHER): Payer: Medicaid Other | Admitting: Obstetrics & Gynecology

## 2017-09-01 VITALS — BP 100/60 | Wt 129.0 lb

## 2017-09-01 DIAGNOSIS — Z23 Encounter for immunization: Secondary | ICD-10-CM

## 2017-09-01 DIAGNOSIS — Z3A29 29 weeks gestation of pregnancy: Secondary | ICD-10-CM

## 2017-09-01 DIAGNOSIS — O365930 Maternal care for other known or suspected poor fetal growth, third trimester, not applicable or unspecified: Secondary | ICD-10-CM

## 2017-09-01 DIAGNOSIS — O99331 Smoking (tobacco) complicating pregnancy, first trimester: Secondary | ICD-10-CM

## 2017-09-01 NOTE — Addendum Note (Signed)
Addended by: Cornelius MorasPATTERSON, Cornelia Walraven D on: 09/01/2017 11:39 AM   Modules accepted: Orders

## 2017-09-01 NOTE — Patient Instructions (Signed)
Third Trimester of Pregnancy The third trimester is from week 28 through week 40 (months 7 through 9). The third trimester is a time when the unborn baby (fetus) is growing rapidly. At the end of the ninth month, the fetus is about 20 inches in length and weighs 6-10 pounds. Body changes during your third trimester Your body will continue to go through many changes during pregnancy. The changes vary from woman to woman. During the third trimester:  Your weight will continue to increase. You can expect to gain 25-35 pounds (11-16 kg) by the end of the pregnancy.  You may begin to get stretch marks on your hips, abdomen, and breasts.  You may urinate more often because the fetus is moving lower into your pelvis and pressing on your bladder.  You may develop or continue to have heartburn. This is caused by increased hormones that slow down muscles in the digestive tract.  You may develop or continue to have constipation because increased hormones slow digestion and cause the muscles that push waste through your intestines to relax.  You may develop hemorrhoids. These are swollen veins (varicose veins) in the rectum that can itch or be painful.  You may develop swollen, bulging veins (varicose veins) in your legs.  You may have increased body aches in the pelvis, back, or thighs. This is due to weight gain and increased hormones that are relaxing your joints.  You may have changes in your hair. These can include thickening of your hair, rapid growth, and changes in texture. Some women also have hair loss during or after pregnancy, or hair that feels dry or thin. Your hair will most likely return to normal after your baby is born.  Your breasts will continue to grow and they will continue to become tender. A yellow fluid (colostrum) may leak from your breasts. This is the first milk you are producing for your baby.  Your belly button may stick out.  You may notice more swelling in your hands,  face, or ankles.  You may have increased tingling or numbness in your hands, arms, and legs. The skin on your belly may also feel numb.  You may feel short of breath because of your expanding uterus.  You may have more problems sleeping. This can be caused by the size of your belly, increased need to urinate, and an increase in your body's metabolism.  You may notice the fetus "dropping," or moving lower in your abdomen (lightening).  You may have increased vaginal discharge.  You may notice your joints feel loose and you may have pain around your pelvic bone.  What to expect at prenatal visits You will have prenatal exams every 2 weeks until week 36. Then you will have weekly prenatal exams. During a routine prenatal visit:  You will be weighed to make sure you and the baby are growing normally.  Your blood pressure will be taken.  Your abdomen will be measured to track your baby's growth.  The fetal heartbeat will be listened to.  Any test results from the previous visit will be discussed.  You may have a cervical check near your due date to see if your cervix has softened or thinned (effaced).  You will be tested for Group B streptococcus. This happens between 35 and 37 weeks.  Your health care provider may ask you:  What your birth plan is.  How you are feeling.  If you are feeling the baby move.  If you have had   any abnormal symptoms, such as leaking fluid, bleeding, severe headaches, or abdominal cramping.  If you are using any tobacco products, including cigarettes, chewing tobacco, and electronic cigarettes.  If you have any questions.  Other tests or screenings that may be performed during your third trimester include:  Blood tests that check for low iron levels (anemia).  Fetal testing to check the health, activity level, and growth of the fetus. Testing is done if you have certain medical conditions or if there are problems during the  pregnancy.  Nonstress test (NST). This test checks the health of your baby to make sure there are no signs of problems, such as the baby not getting enough oxygen. During this test, a belt is placed around your belly. The baby is made to move, and its heart rate is monitored during movement.  What is false labor? False labor is a condition in which you feel small, irregular tightenings of the muscles in the womb (contractions) that usually go away with rest, changing position, or drinking water. These are called Braxton Hicks contractions. Contractions may last for hours, days, or even weeks before true labor sets in. If contractions come at regular intervals, become more frequent, increase in intensity, or become painful, you should see your health care provider. What are the signs of labor?  Abdominal cramps.  Regular contractions that start at 10 minutes apart and become stronger and more frequent with time.  Contractions that start on the top of the uterus and spread down to the lower abdomen and back.  Increased pelvic pressure and dull back pain.  A watery or bloody mucus discharge that comes from the vagina.  Leaking of amniotic fluid. This is also known as your "water breaking." It could be a slow trickle or a gush. Let your health care provider know if it has a color or strange odor. If you have any of these signs, call your health care provider right away, even if it is before your due date. Follow these instructions at home: Medicines  Follow your health care provider's instructions regarding medicine use. Specific medicines may be either safe or unsafe to take during pregnancy.  Take a prenatal vitamin that contains at least 600 micrograms (mcg) of folic acid.  If you develop constipation, try taking a stool softener if your health care provider approves. Eating and drinking  Eat a balanced diet that includes fresh fruits and vegetables, whole grains, good sources of protein  such as meat, eggs, or tofu, and low-fat dairy. Your health care provider will help you determine the amount of weight gain that is right for you.  Avoid raw meat and uncooked cheese. These carry germs that can cause birth defects in the baby.  If you have low calcium intake from food, talk to your health care provider about whether you should take a daily calcium supplement.  Eat four or five small meals rather than three large meals a day.  Limit foods that are high in fat and processed sugars, such as fried and sweet foods.  To prevent constipation: ? Drink enough fluid to keep your urine clear or pale yellow. ? Eat foods that are high in fiber, such as fresh fruits and vegetables, whole grains, and beans. Activity  Exercise only as directed by your health care provider. Most women can continue their usual exercise routine during pregnancy. Try to exercise for 30 minutes at least 5 days a week. Stop exercising if you experience uterine contractions.  Avoid heavy   lifting.  Do not exercise in extreme heat or humidity, or at high altitudes.  Wear low-heel, comfortable shoes.  Practice good posture.  You may continue to have sex unless your health care provider tells you otherwise. Relieving pain and discomfort  Take frequent breaks and rest with your legs elevated if you have leg cramps or low back pain.  Take warm sitz baths to soothe any pain or discomfort caused by hemorrhoids. Use hemorrhoid cream if your health care provider approves.  Wear a good support bra to prevent discomfort from breast tenderness.  If you develop varicose veins: ? Wear support pantyhose or compression stockings as told by your healthcare provider. ? Elevate your feet for 15 minutes, 3-4 times a day. Prenatal care  Write down your questions. Take them to your prenatal visits.  Keep all your prenatal visits as told by your health care provider. This is important. Safety  Wear your seat belt at  all times when driving.  Make a list of emergency phone numbers, including numbers for family, friends, the hospital, and police and fire departments. General instructions  Avoid cat litter boxes and soil used by cats. These carry germs that can cause birth defects in the baby. If you have a cat, ask someone to clean the litter box for you.  Do not travel far distances unless it is absolutely necessary and only with the approval of your health care provider.  Do not use hot tubs, steam rooms, or saunas.  Do not drink alcohol.  Do not use any products that contain nicotine or tobacco, such as cigarettes and e-cigarettes. If you need help quitting, ask your health care provider.  Do not use any medicinal herbs or unprescribed drugs. These chemicals affect the formation and growth of the baby.  Do not douche or use tampons or scented sanitary pads.  Do not cross your legs for long periods of time.  To prepare for the arrival of your baby: ? Take prenatal classes to understand, practice, and ask questions about labor and delivery. ? Make a trial run to the hospital. ? Visit the hospital and tour the maternity area. ? Arrange for maternity or paternity leave through employers. ? Arrange for family and friends to take care of pets while you are in the hospital. ? Purchase a rear-facing car seat and make sure you know how to install it in your car. ? Pack your hospital bag. ? Prepare the baby's nursery. Make sure to remove all pillows and stuffed animals from the baby's crib to prevent suffocation.  Visit your dentist if you have not gone during your pregnancy. Use a soft toothbrush to brush your teeth and be gentle when you floss. Contact a health care provider if:  You are unsure if you are in labor or if your water has broken.  You become dizzy.  You have mild pelvic cramps, pelvic pressure, or nagging pain in your abdominal area.  You have lower back pain.  You have persistent  nausea, vomiting, or diarrhea.  You have an unusual or bad smelling vaginal discharge.  You have pain when you urinate. Get help right away if:  Your water breaks before 37 weeks.  You have regular contractions less than 5 minutes apart before 37 weeks.  You have a fever.  You are leaking fluid from your vagina.  You have spotting or bleeding from your vagina.  You have severe abdominal pain or cramping.  You have rapid weight loss or weight gain.    You have shortness of breath with chest pain.  You notice sudden or extreme swelling of your face, hands, ankles, feet, or legs.  Your baby makes fewer than 10 movements in 2 hours.  You have severe headaches that do not go away when you take medicine.  You have vision changes. Summary  The third trimester is from week 28 through week 40, months 7 through 9. The third trimester is a time when the unborn baby (fetus) is growing rapidly.  During the third trimester, your discomfort may increase as you and your baby continue to gain weight. You may have abdominal, leg, and back pain, sleeping problems, and an increased need to urinate.  During the third trimester your breasts will keep growing and they will continue to become tender. A yellow fluid (colostrum) may leak from your breasts. This is the first milk you are producing for your baby.  False labor is a condition in which you feel small, irregular tightenings of the muscles in the womb (contractions) that eventually go away. These are called Braxton Hicks contractions. Contractions may last for hours, days, or even weeks before true labor sets in.  Signs of labor can include: abdominal cramps; regular contractions that start at 10 minutes apart and become stronger and more frequent with time; watery or bloody mucus discharge that comes from the vagina; increased pelvic pressure and dull back pain; and leaking of amniotic fluid. This information is not intended to replace advice  given to you by your health care provider. Make sure you discuss any questions you have with your health care provider. Document Released: 06/18/2001 Document Revised: 11/30/2015 Document Reviewed: 08/25/2012 Elsevier Interactive Patient Education  2017 [employer]  

## 2017-09-01 NOTE — Progress Notes (Signed)
Prenatal Visit Note Date: 09/01/2017 Clinic: [facility]  Subjective:  Adrienne West is a 27 y.o. G3P2002 at 1915w5d being seen today for ongoing prenatal care.  She is currently monitored for the following issues for this high-risk pregnancy and has Supervision of normal pregnancy; Bipolar disorder (HCC); Tobacco smoking affecting pregnancy in first trimester; and Poor fetal growth affecting management of mother on their problem list.  Patient reports no complaints and denies ctxs, ROM, VB.  Good FM.Marland Kitchen.   Contractions: Not present. Vag. Bleeding: None.  Movement: Present. Denies leaking of fluid.   The following portions of the patient's history were reviewed and updated as appropriate: allergies, current medications, past family history, past medical history, past social history, past surgical history and problem list. Problem list updated.  Objective:   Vitals:   09/01/17 1035  BP: 100/60  Weight: 129 lb (58.5 kg)    Fetal Status:     Movement: Present     General:  Alert, oriented and cooperative. Patient is in no acute distress.  Skin: Skin is warm and dry. No rash noted.   Cardiovascular: Normal heart rate noted  Respiratory: Normal respiratory effort, no problems with respiration noted  Abdomen: Soft, gravid, appropriate for gestational age. Pain/Pressure: Absent     Pelvic:  Cervical exam deferred        Extremities: Normal range of motion.     Mental Status: Normal mood and affect. Normal behavior. Normal judgment and thought content.   Urinalysis: Urine Protein: Negative Urine Glucose: Negative  Assessment and Plan:  Pregnancy: G3P2002 at 5415w5d  1. [redacted] weeks gestation of pregnancy  2. Tobacco smoking affecting pregnancy in first trimester Encouraged to quit.  Importance for fetal growth as well as other risk factors.  3. Poor fetal growth affecting management of mother in third trimester, single or unspecified fetus As above [facility]  Preterm labor symptoms and general obstetric precautions including but not limited to vaginal bleeding, contractions, leaking of fluid and fetal movement were reviewed in detail with the patient. Please refer to After Visit Summary for other counseling recommendations.  Return in about 1 week (around 09/08/2017) for HROB w NST weekly Flu and TDaP today  Annamarie MajorPaul Kyal Arts, MD, Merlinda FrederickFACOG [facility], [facility] 09/01/2017  11:08 AM

## 2017-09-04 ENCOUNTER — Ambulatory Visit
Admission: RE | Admit: 2017-09-04 | Discharge: 2017-09-04 | Disposition: A | Discharge status: Home / Self Care | Payer: Medicaid Other | Source: Ambulatory Visit | Attending: Maternal & Fetal Medicine | Admitting: Maternal & Fetal Medicine

## 2017-09-04 ENCOUNTER — Other Ambulatory Visit: Payer: Self-pay | Admitting: Obstetrics & Gynecology

## 2017-09-04 DIAGNOSIS — O321XX0 Maternal care for breech presentation, not applicable or unspecified: Secondary | ICD-10-CM | POA: Diagnosis not present

## 2017-09-04 DIAGNOSIS — Z3483 Encounter for supervision of other normal pregnancy, third trimester: Secondary | ICD-10-CM

## 2017-09-04 DIAGNOSIS — O365930 Maternal care for other known or suspected poor fetal growth, third trimester, not applicable or unspecified: Secondary | ICD-10-CM | POA: Diagnosis present

## 2017-09-04 DIAGNOSIS — Z3A30 30 weeks gestation of pregnancy: Secondary | ICD-10-CM | POA: Insufficient documentation

## 2017-09-04 DIAGNOSIS — F17210 Nicotine dependence, cigarettes, uncomplicated: Secondary | ICD-10-CM | POA: Insufficient documentation

## 2017-09-04 DIAGNOSIS — O99333 Smoking (tobacco) complicating pregnancy, third trimester: Secondary | ICD-10-CM | POA: Diagnosis not present

## 2017-09-05 ENCOUNTER — Encounter: Payer: Medicaid Other | Admitting: Obstetrics and Gynecology

## 2017-09-08 ENCOUNTER — Encounter: Payer: Self-pay | Admitting: Maternal Newborn

## 2017-09-08 ENCOUNTER — Ambulatory Visit (INDEPENDENT_AMBULATORY_CARE_PROVIDER_SITE_OTHER): Payer: Medicaid Other | Admitting: Maternal Newborn

## 2017-09-08 ENCOUNTER — Other Ambulatory Visit: Payer: Self-pay | Admitting: *Deleted

## 2017-09-08 VITALS — BP 90/60 | Wt 129.0 lb

## 2017-09-08 DIAGNOSIS — Z3483 Encounter for supervision of other normal pregnancy, third trimester: Secondary | ICD-10-CM | POA: Diagnosis not present

## 2017-09-08 DIAGNOSIS — O365930 Maternal care for other known or suspected poor fetal growth, third trimester, not applicable or unspecified: Secondary | ICD-10-CM

## 2017-09-08 DIAGNOSIS — O365920 Maternal care for other known or suspected poor fetal growth, second trimester, not applicable or unspecified: Secondary | ICD-10-CM

## 2017-09-08 NOTE — Progress Notes (Signed)
Routine Prenatal Care Visit  Subjective  Adrienne West is a 27 y.o. G3P2002 at [redacted]w[redacted]d being seen today for ongoing prenatal care.  She is currently monitored for the following issues for this low-risk pregnancy and has Supervision of normal pregnancy; Bipolar disorder (HCC); Tobacco smoking affecting pregnancy in first trimester; and Poor fetal growth affecting management of mother on their problem list.  ----------------------------------------------------------------------------------- Patient reports fatigue and round ligament pain.  Contractions: Not present. Vag. Bleeding: None.  Movement: Present. Denies leaking of fluid.  ----------------------------------------------------------------------------------- The following portions of the patient's history were reviewed and updated as appropriate: allergies, current medications, past family history, past medical history, past social history, past surgical history and problem list. Problem list updated.   Objective  Last menstrual period 02/05/2017, not currently breastfeeding. Pregravid weight 118 lb (53.5 kg) Total Weight Gain 11 lb (4.99 kg) Urinalysis: Urine Protein: Negative Urine Glucose: Negative  Fetal Status: Fetal Heart Rate (bpm): 145 Fundal Height: 30 cm Movement: Present     General:  Alert, oriented and cooperative. Patient is in no acute distress.  Skin: Skin is warm and dry. No rash noted.   Cardiovascular: Normal heart rate noted  Respiratory: Normal respiratory effort, no problems with respiration noted  Abdomen: Soft, gravid, appropriate for gestational age. Pain/Pressure: Present     Pelvic:  Cervical exam deferred        Extremities: Normal range of motion.  Edema: None  Mental Status: Normal mood and affect. Normal behavior. Normal judgment and thought content.   Baseline: 125 Variability: moderate Accelerations: present Decelerations: absent Tocometry: none The patient was monitored for 20+ minutes,  fetal heart rate tracing was deemed reactive, category I tracing.  Assessment   [name] at [redacted]w[redacted]d, EDD 11/12/2017 by Last Menstrual Period presenting for routine prenatal visit.  Plan   pregnancy 3 Problems (from 02/05/17 to present)    Problem Noted Resolved   Poor fetal growth affecting management of mother 08/28/2017 by [place]gut, [place], MD No   Supervision of normal pregnancy 04/17/2017 by Oswaldo Conroy, CNM No   Overview Addendum 08/26/2017  2:31 AM by Conard Novak, MD    Clinic [facility] Prenatal Labs  Dating  Blood type: O Pos  Genetic Screen 1 Screen:    AFP:     Quad:     NIPS: Antibody:Neg  Anatomic [place]  Rubella: Immune Varicella: Immune  GTT Third trimester: 89 RPR: NR  Rhogam  HBsAg: Neg  TDaP vaccine                       Flu Shot: HIV: NR  Baby Food    Breast                            GBS:   Contraception Depo &/or Nexplanon Pap:  CBB  Given info   CS/VBAC Not applicable   Support Person               Good fetal movement as soon as transducer applied to abdomen. NST today reactive. AFI 6 cm and BPP 8/8 on last ultrasound at [facility] on 2/28. Will return to [facility] for scheduled BPP later this week.  Discussed poor appetite and weight gain. Patient trying to include more sources of protein in her diet. Encouraged nutritious high calorie snacks.  Preterm labor symptoms and general obstetric precautions including but not limited to vaginal bleeding, contractions, leaking of  fluid and fetal movement were reviewed in detail with the patient.  Return in about 1 week (around 09/15/2017) for ROB with NST.  Marcelyn BruinsJacelyn Schmid, CNM 09/08/2017  10:42 AM

## 2017-09-08 NOTE — Progress Notes (Signed)
C/o cramping last night and this am; got up at 6:30am, has drank 2c coffee, 16oz water, 8oz glass of juice - no fetal movement. [email or web address]

## 2017-09-11 ENCOUNTER — Ambulatory Visit
Admission: RE | Admit: 2017-09-11 | Discharge: 2017-09-11 | Disposition: A | Discharge status: Home / Self Care | Payer: Medicaid Other | Source: Ambulatory Visit | Attending: Maternal & Fetal Medicine | Admitting: Maternal & Fetal Medicine

## 2017-09-11 DIAGNOSIS — O99333 Smoking (tobacco) complicating pregnancy, third trimester: Secondary | ICD-10-CM | POA: Insufficient documentation

## 2017-09-11 DIAGNOSIS — Z3A31 31 weeks gestation of pregnancy: Secondary | ICD-10-CM | POA: Diagnosis not present

## 2017-09-11 DIAGNOSIS — O321XX0 Maternal care for breech presentation, not applicable or unspecified: Secondary | ICD-10-CM | POA: Diagnosis not present

## 2017-09-11 DIAGNOSIS — F17210 Nicotine dependence, cigarettes, uncomplicated: Secondary | ICD-10-CM | POA: Insufficient documentation

## 2017-09-11 DIAGNOSIS — O365930 Maternal care for other known or suspected poor fetal growth, third trimester, not applicable or unspecified: Secondary | ICD-10-CM | POA: Insufficient documentation

## 2017-09-11 DIAGNOSIS — Z3483 Encounter for supervision of other normal pregnancy, third trimester: Secondary | ICD-10-CM

## 2017-09-11 DIAGNOSIS — O365920 Maternal care for other known or suspected poor fetal growth, second trimester, not applicable or unspecified: Secondary | ICD-10-CM

## 2017-09-15 ENCOUNTER — Other Ambulatory Visit: Payer: Self-pay | Admitting: Obstetrics & Gynecology

## 2017-09-15 ENCOUNTER — Other Ambulatory Visit: Payer: Self-pay | Admitting: *Deleted

## 2017-09-15 ENCOUNTER — Ambulatory Visit (INDEPENDENT_AMBULATORY_CARE_PROVIDER_SITE_OTHER): Payer: Medicaid Other | Admitting: Obstetrics & Gynecology

## 2017-09-15 VITALS — BP 102/64 | Wt 132.0 lb

## 2017-09-15 DIAGNOSIS — O365930 Maternal care for other known or suspected poor fetal growth, third trimester, not applicable or unspecified: Secondary | ICD-10-CM

## 2017-09-15 DIAGNOSIS — Z3A31 31 weeks gestation of pregnancy: Secondary | ICD-10-CM | POA: Diagnosis not present

## 2017-09-15 DIAGNOSIS — O99331 Smoking (tobacco) complicating pregnancy, first trimester: Secondary | ICD-10-CM | POA: Diagnosis not present

## 2017-09-15 MED ORDER — BETAMETHASONE SOD PHOS & ACET 6 (3-3) MG/ML IJ SUSP: 12.0000 mg | INTRAMUSCULAR | Status: AC

## 2017-09-15 NOTE — Patient Instructions (Signed)
Third Trimester of Pregnancy The third trimester is from week 28 through week 40 (months 7 through 9). The third trimester is a time when the unborn baby (fetus) is growing rapidly. At the end of the ninth month, the fetus is about 20 inches in length and weighs 6-10 pounds. Body changes during your third trimester Your body will continue to go through many changes during pregnancy. The changes vary from woman to woman. During the third trimester:  Your weight will continue to increase. You can expect to gain 25-35 pounds (11-16 kg) by the end of the pregnancy.  You may begin to get stretch marks on your hips, abdomen, and breasts.  You may urinate more often because the fetus is moving lower into your pelvis and pressing on your bladder.  You may develop or continue to have heartburn. This is caused by increased hormones that slow down muscles in the digestive tract.  You may develop or continue to have constipation because increased hormones slow digestion and cause the muscles that push waste through your intestines to relax.  You may develop hemorrhoids. These are swollen veins (varicose veins) in the rectum that can itch or be painful.  You may develop swollen, bulging veins (varicose veins) in your legs.  You may have increased body aches in the pelvis, back, or thighs. This is due to weight gain and increased hormones that are relaxing your joints.  You may have changes in your hair. These can include thickening of your hair, rapid growth, and changes in texture. Some women also have hair loss during or after pregnancy, or hair that feels dry or thin. Your hair will most likely return to normal after your baby is born.  Your breasts will continue to grow and they will continue to become tender. A yellow fluid (colostrum) may leak from your breasts. This is the first milk you are producing for your baby.  Your belly button may stick out.  You may notice more swelling in your hands,  face, or ankles.  You may have increased tingling or numbness in your hands, arms, and legs. The skin on your belly may also feel numb.  You may feel short of breath because of your expanding uterus.  You may have more problems sleeping. This can be caused by the size of your belly, increased need to urinate, and an increase in your body's metabolism.  You may notice the fetus "dropping," or moving lower in your abdomen (lightening).  You may have increased vaginal discharge.  You may notice your joints feel loose and you may have pain around your pelvic bone.  What to expect at prenatal visits You will have prenatal exams every 2 weeks until week 36. Then you will have weekly prenatal exams. During a routine prenatal visit:  You will be weighed to make sure you and the baby are growing normally.  Your blood pressure will be taken.  Your abdomen will be measured to track your baby's growth.  The fetal heartbeat will be listened to.  Any test results from the previous visit will be discussed.  You may have a cervical check near your due date to see if your cervix has softened or thinned (effaced).  You will be tested for Group B streptococcus. This happens between 35 and 37 weeks.  Your health care provider may ask you:  What your birth plan is.  How you are feeling.  If you are feeling the baby move.  If you have had   any abnormal symptoms, such as leaking fluid, bleeding, severe headaches, or abdominal cramping.  If you are using any tobacco products, including cigarettes, chewing tobacco, and electronic cigarettes.  If you have any questions.  Other tests or screenings that may be performed during your third trimester include:  Blood tests that check for low iron levels (anemia).  Fetal testing to check the health, activity level, and growth of the fetus. Testing is done if you have certain medical conditions or if there are problems during the  pregnancy.  Nonstress test (NST). This test checks the health of your baby to make sure there are no signs of problems, such as the baby not getting enough oxygen. During this test, a belt is placed around your belly. The baby is made to move, and its heart rate is monitored during movement.  What is false labor? False labor is a condition in which you feel small, irregular tightenings of the muscles in the womb (contractions) that usually go away with rest, changing position, or drinking water. These are called Braxton Hicks contractions. Contractions may last for hours, days, or even weeks before true labor sets in. If contractions come at regular intervals, become more frequent, increase in intensity, or become painful, you should see your health care provider. What are the signs of labor?  Abdominal cramps.  Regular contractions that start at 10 minutes apart and become stronger and more frequent with time.  Contractions that start on the top of the uterus and spread down to the lower abdomen and back.  Increased pelvic pressure and dull back pain.  A watery or bloody mucus discharge that comes from the vagina.  Leaking of amniotic fluid. This is also known as your "water breaking." It could be a slow trickle or a gush. Let your health care provider know if it has a color or strange odor. If you have any of these signs, call your health care provider right away, even if it is before your due date. Follow these instructions at home: Medicines  Follow your health care provider's instructions regarding medicine use. Specific medicines may be either safe or unsafe to take during pregnancy.  Take a prenatal vitamin that contains at least 600 micrograms (mcg) of folic acid.  If you develop constipation, try taking a stool softener if your health care provider approves. Eating and drinking  Eat a balanced diet that includes fresh fruits and vegetables, whole grains, good sources of protein  such as meat, eggs, or tofu, and low-fat dairy. Your health care provider will help you determine the amount of weight gain that is right for you.  Avoid raw meat and uncooked cheese. These carry germs that can cause birth defects in the baby.  If you have low calcium intake from food, talk to your health care provider about whether you should take a daily calcium supplement.  Eat four or five small meals rather than three large meals a day.  Limit foods that are high in fat and processed sugars, such as fried and sweet foods.  To prevent constipation: ? Drink enough fluid to keep your urine clear or pale yellow. ? Eat foods that are high in fiber, such as fresh fruits and vegetables, whole grains, and beans. Activity  Exercise only as directed by your health care provider. Most women can continue their usual exercise routine during pregnancy. Try to exercise for 30 minutes at least 5 days a week. Stop exercising if you experience uterine contractions.  Avoid heavy   lifting.  Do not exercise in extreme heat or humidity, or at high altitudes.  Wear low-heel, comfortable shoes.  Practice good posture.  You may continue to have sex unless your health care provider tells you otherwise. Relieving pain and discomfort  Take frequent breaks and rest with your legs elevated if you have leg cramps or low back pain.  Take warm sitz baths to soothe any pain or discomfort caused by hemorrhoids. Use hemorrhoid cream if your health care provider approves.  Wear a good support bra to prevent discomfort from breast tenderness.  If you develop varicose veins: ? Wear support pantyhose or compression stockings as told by your healthcare provider. ? Elevate your feet for 15 minutes, 3-4 times a day. Prenatal care  Write down your questions. Take them to your prenatal visits.  Keep all your prenatal visits as told by your health care provider. This is important. Safety  Wear your seat belt at  all times when driving.  Make a list of emergency phone numbers, including numbers for family, friends, the hospital, and police and fire departments. General instructions  Avoid cat litter boxes and soil used by cats. These carry germs that can cause birth defects in the baby. If you have a cat, ask someone to clean the litter box for you.  Do not travel far distances unless it is absolutely necessary and only with the approval of your health care provider.  Do not use hot tubs, steam rooms, or saunas.  Do not drink alcohol.  Do not use any products that contain nicotine or tobacco, such as cigarettes and e-cigarettes. If you need help quitting, ask your health care provider.  Do not use any medicinal herbs or unprescribed drugs. These chemicals affect the formation and growth of the baby.  Do not douche or use tampons or scented sanitary pads.  Do not cross your legs for long periods of time.  To prepare for the arrival of your baby: ? Take prenatal classes to understand, practice, and ask questions about labor and delivery. ? Make a trial run to the hospital. ? Visit the hospital and tour the maternity area. ? Arrange for maternity or paternity leave through employers. ? Arrange for family and friends to take care of pets while you are in the hospital. ? Purchase a rear-facing car seat and make sure you know how to install it in your car. ? Pack your hospital bag. ? Prepare the baby's nursery. Make sure to remove all pillows and stuffed animals from the baby's crib to prevent suffocation.  Visit your dentist if you have not gone during your pregnancy. Use a soft toothbrush to brush your teeth and be gentle when you floss. Contact a health care provider if:  You are unsure if you are in labor or if your water has broken.  You become dizzy.  You have mild pelvic cramps, pelvic pressure, or nagging pain in your abdominal area.  You have lower back pain.  You have persistent  nausea, vomiting, or diarrhea.  You have an unusual or bad smelling vaginal discharge.  You have pain when you urinate. Get help right away if:  Your water breaks before 37 weeks.  You have regular contractions less than 5 minutes apart before 37 weeks.  You have a fever.  You are leaking fluid from your vagina.  You have spotting or bleeding from your vagina.  You have severe abdominal pain or cramping.  You have rapid weight loss or weight gain.    You have shortness of breath with chest pain.  You notice sudden or extreme swelling of your face, hands, ankles, feet, or legs.  Your baby makes fewer than 10 movements in 2 hours.  You have severe headaches that do not go away when you take medicine.  You have vision changes. Summary  The third trimester is from week 28 through week 40, months 7 through 9. The third trimester is a time when the unborn baby (fetus) is growing rapidly.  During the third trimester, your discomfort may increase as you and your baby continue to gain weight. You may have abdominal, leg, and back pain, sleeping problems, and an increased need to urinate.  During the third trimester your breasts will keep growing and they will continue to become tender. A yellow fluid (colostrum) may leak from your breasts. This is the first milk you are producing for your baby.  False labor is a condition in which you feel small, irregular tightenings of the muscles in the womb (contractions) that eventually go away. These are called Braxton Hicks contractions. Contractions may last for hours, days, or even weeks before true labor sets in.  Signs of labor can include: abdominal cramps; regular contractions that start at 10 minutes apart and become stronger and more frequent with time; watery or bloody mucus discharge that comes from the vagina; increased pelvic pressure and dull back pain; and leaking of amniotic fluid. This information is not intended to replace advice  given to you by your health care provider. Make sure you discuss any questions you have with your health care provider. Document Released: 06/18/2001 Document Revised: 11/30/2015 Document Reviewed: 08/25/2012 Elsevier Interactive Patient Education  2017 [employer]  

## 2017-09-15 NOTE — Progress Notes (Signed)
Prenatal Visit Note Date: 09/15/2017 Clinic: [facility]  Subjective:  Adrienne West is a 27 y.o. G3P2002 at 169w5d being seen today for ongoing prenatal care.  She is currently monitored for the following issues for this high-risk pregnancy and has Supervision of normal pregnancy; Bipolar disorder (HCC); Tobacco smoking affecting pregnancy in first trimester; and Poor fetal growth affecting management of mother on their problem list.  Patient reports pain after sex yesterday.    .  .   . Denies leaking of fluid. No bleeding.  The following portions of the patient's history were reviewed and updated as appropriate: allergies, current medications, past family history, past medical history, past social history, past surgical history and problem list. Problem list updated.  Objective:   Vitals:   09/15/17 0942  BP: 102/64  Weight: 132 lb (59.9 kg)    Fetal Status:           General:  Alert, oriented and cooperative. Patient is in no acute distress.  Skin: Skin is warm and dry. No rash noted.   Cardiovascular: Normal heart rate noted  Respiratory: Normal respiratory effort, no problems with respiration noted  Abdomen: Soft, gravid, appropriate for gestational age.       Pelvic:  Cervical exam performed        Extremities: Normal range of motion.     Mental Status: Normal mood and affect. Normal behavior. Normal judgment and thought content.   Urinalysis: Urine Protein: Negative Urine Glucose: Negative  Assessment and Plan:  Pregnancy: G3P2002 at 2669w5d  1. [redacted] weeks gestation of pregnancy NST today. Some ligament pain. Pain w/intercourse. Some indigestion this am relieved w/gas-x  A NST procedure was performed with FHR monitoring and a normal baseline established, appropriate time of 20-40 minutes of evaluation, and accels >2 seen w 15x15 characteristics.  Results show a REACTIVE NST.   2. Poor fetal growth affecting management of mother in third trimester, single or unspecified  fetus - DP BPP every Thurs - Understands risks of IUGR and possible need for IOL at a premature gest age  663. Tobacco smoking affecting pregnancy in first trimester - Encouraged to quit  Preterm labor symptoms and general obstetric precautions including but not limited to vaginal bleeding, contractions, leaking of fluid and fetal movement were reviewed in detail with the patient. Please refer to After Visit Summary for other counseling recommendations.  Return for as scheduled.  Annamarie MajorPaul Brooklyn Jeff, MD, Merlinda FrederickFACOG [facility], [facility] 09/15/2017  10:14 AM

## 2017-09-16 ENCOUNTER — Inpatient Hospital Stay
Admission: EM | Admit: 2017-09-16 | Discharge: 2017-09-16 | Disposition: A | Discharge status: Home / Self Care | Payer: Medicaid Other | Attending: Obstetrics and Gynecology | Admitting: Obstetrics and Gynecology

## 2017-09-16 DIAGNOSIS — Z3A31 31 weeks gestation of pregnancy: Secondary | ICD-10-CM | POA: Insufficient documentation

## 2017-09-16 DIAGNOSIS — O365930 Maternal care for other known or suspected poor fetal growth, third trimester, not applicable or unspecified: Secondary | ICD-10-CM | POA: Insufficient documentation

## 2017-09-16 MED ORDER — BETAMETHASONE SOD PHOS & ACET 6 (3-3) MG/ML IJ SUSP
INTRAMUSCULAR | Status: AC
  Filled 2017-09-16: qty 1

## 2017-09-16 MED ORDER — BETAMETHASONE SOD PHOS & ACET 6 (3-3) MG/ML IJ SUSP
12.0000 mg | Freq: Once | INTRAMUSCULAR | Status: AC
  Administered 2017-09-16: 12 mg via INTRAMUSCULAR

## 2017-09-17 ENCOUNTER — Observation Stay
Admission: EM | Admit: 2017-09-17 | Discharge: 2017-09-17 | Disposition: A | Discharge status: Home / Self Care | Payer: Medicaid Other | Attending: Obstetrics & Gynecology | Admitting: Obstetrics & Gynecology

## 2017-09-17 ENCOUNTER — Telehealth: Payer: Self-pay

## 2017-09-17 DIAGNOSIS — O99343 Other mental disorders complicating pregnancy, third trimester: Secondary | ICD-10-CM | POA: Insufficient documentation

## 2017-09-17 DIAGNOSIS — O365930 Maternal care for other known or suspected poor fetal growth, third trimester, not applicable or unspecified: Principal | ICD-10-CM | POA: Insufficient documentation

## 2017-09-17 DIAGNOSIS — F319 Bipolar disorder, unspecified: Secondary | ICD-10-CM | POA: Diagnosis not present

## 2017-09-17 DIAGNOSIS — O99333 Smoking (tobacco) complicating pregnancy, third trimester: Secondary | ICD-10-CM | POA: Insufficient documentation

## 2017-09-17 DIAGNOSIS — Z3A31 31 weeks gestation of pregnancy: Secondary | ICD-10-CM | POA: Diagnosis not present

## 2017-09-17 MED ORDER — BETAMETHASONE SOD PHOS & ACET 6 (3-3) MG/ML IJ SUSP
12.0000 mg | Freq: Once | INTRAMUSCULAR | Status: AC
  Administered 2017-09-17: 12 mg via INTRAMUSCULAR

## 2017-09-17 MED ORDER — BETAMETHASONE SOD PHOS & ACET 6 (3-3) MG/ML IJ SUSP
INTRAMUSCULAR | Status: AC
  Administered 2017-09-17: 12 mg via INTRAMUSCULAR
  Filled 2017-09-17: qty 1

## 2017-09-17 NOTE — Telephone Encounter (Signed)
Pt aware. Will monitor & contact us tomorrow for apt if s&s persist.

## 2017-09-17 NOTE — Progress Notes (Signed)
Patient presented in L&D for second dose of betamethasone.

## 2017-09-17 NOTE — Telephone Encounter (Signed)
[facility] today [facility]. Throughout yesterday & today after shots, she has been experiencing some ctx, back pain & upper abdominal pain. Pt uncertain if sign of PTL or a side effect of injections. Cb#[phone]

## 2017-09-17 NOTE — Telephone Encounter (Signed)
Appt tomorrow if still has sx's; may be side effect

## 2017-09-18 ENCOUNTER — Ambulatory Visit
Admission: RE | Admit: 2017-09-18 | Discharge: 2017-09-18 | Disposition: A | Discharge status: Home / Self Care | Payer: Medicaid Other | Source: Ambulatory Visit | Attending: Obstetrics and Gynecology | Admitting: Obstetrics and Gynecology

## 2017-09-18 ENCOUNTER — Other Ambulatory Visit: Payer: Self-pay | Admitting: Obstetrics and Gynecology

## 2017-09-18 ENCOUNTER — Ambulatory Visit (HOSPITAL_BASED_OUTPATIENT_CLINIC_OR_DEPARTMENT_OTHER)
Admission: RE | Admit: 2017-09-18 | Discharge: 2017-09-18 | Disposition: A | Discharge status: Home / Self Care | Payer: Medicaid Other | Source: Ambulatory Visit | Attending: Obstetrics and Gynecology | Admitting: Obstetrics and Gynecology

## 2017-09-18 DIAGNOSIS — O321XX0 Maternal care for breech presentation, not applicable or unspecified: Secondary | ICD-10-CM | POA: Diagnosis not present

## 2017-09-18 DIAGNOSIS — Z3A32 32 weeks gestation of pregnancy: Secondary | ICD-10-CM | POA: Diagnosis not present

## 2017-09-18 DIAGNOSIS — O365930 Maternal care for other known or suspected poor fetal growth, third trimester, not applicable or unspecified: Secondary | ICD-10-CM

## 2017-09-18 DIAGNOSIS — O4100X0 Oligohydramnios, unspecified trimester, not applicable or unspecified: Secondary | ICD-10-CM | POA: Diagnosis not present

## 2017-09-18 DIAGNOSIS — Z3483 Encounter for supervision of other normal pregnancy, third trimester: Secondary | ICD-10-CM

## 2017-09-18 DIAGNOSIS — Z87891 Personal history of nicotine dependence: Secondary | ICD-10-CM | POA: Insufficient documentation

## 2017-09-18 DIAGNOSIS — O365990 Maternal care for other known or suspected poor fetal growth, unspecified trimester, not applicable or unspecified: Secondary | ICD-10-CM

## 2017-09-18 NOTE — Progress Notes (Signed)
NST performed  Normal baseline  Moderate variability  Pos accels  1 uc noted   Reactive NST   BPP 8/10 -2 breathing  AFI 5 there is a 2 x2 pocket but fluid appears decreased  Normal dopplers  Breech   [facility]  To rest and hydrate discussed with Jean RosenthalJackson.   Jimmey RalphLivingston, Heather Streeper

## 2017-09-22 ENCOUNTER — Other Ambulatory Visit: Payer: Self-pay

## 2017-09-22 ENCOUNTER — Encounter: Payer: Medicaid Other | Admitting: Obstetrics and Gynecology

## 2017-09-22 ENCOUNTER — Telehealth: Payer: Self-pay | Admitting: Obstetrics & Gynecology

## 2017-09-22 ENCOUNTER — Other Ambulatory Visit: Payer: Self-pay | Admitting: *Deleted

## 2017-09-22 ENCOUNTER — Ambulatory Visit (INDEPENDENT_AMBULATORY_CARE_PROVIDER_SITE_OTHER): Payer: Medicaid Other

## 2017-09-22 ENCOUNTER — Observation Stay
Admission: EM | Admit: 2017-09-22 | Discharge: 2017-09-23 | Disposition: A | Discharge status: Home / Self Care | Payer: Medicaid Other | Source: Home / Self Care | Admitting: Obstetrics and Gynecology

## 2017-09-22 ENCOUNTER — Ambulatory Visit (INDEPENDENT_AMBULATORY_CARE_PROVIDER_SITE_OTHER): Payer: Medicaid Other | Admitting: Obstetrics and Gynecology

## 2017-09-22 DIAGNOSIS — O4100X0 Oligohydramnios, unspecified trimester, not applicable or unspecified: Secondary | ICD-10-CM

## 2017-09-22 DIAGNOSIS — O365930 Maternal care for other known or suspected poor fetal growth, third trimester, not applicable or unspecified: Secondary | ICD-10-CM

## 2017-09-22 DIAGNOSIS — O4103X0 Oligohydramnios, third trimester, not applicable or unspecified: Secondary | ICD-10-CM | POA: Insufficient documentation

## 2017-09-22 DIAGNOSIS — O365990 Maternal care for other known or suspected poor fetal growth, unspecified trimester, not applicable or unspecified: Secondary | ICD-10-CM | POA: Diagnosis not present

## 2017-09-22 DIAGNOSIS — O321XX0 Maternal care for breech presentation, not applicable or unspecified: Secondary | ICD-10-CM | POA: Insufficient documentation

## 2017-09-22 DIAGNOSIS — O99333 Smoking (tobacco) complicating pregnancy, third trimester: Secondary | ICD-10-CM

## 2017-09-22 DIAGNOSIS — O0993 Supervision of high risk pregnancy, unspecified, third trimester: Secondary | ICD-10-CM

## 2017-09-22 DIAGNOSIS — O365910 Maternal care for other known or suspected poor fetal growth, first trimester, not applicable or unspecified: Secondary | ICD-10-CM

## 2017-09-22 DIAGNOSIS — Z3A32 32 weeks gestation of pregnancy: Secondary | ICD-10-CM | POA: Insufficient documentation

## 2017-09-22 DIAGNOSIS — O1403 Mild to moderate pre-eclampsia, third trimester: Secondary | ICD-10-CM | POA: Diagnosis not present

## 2017-09-22 DIAGNOSIS — Z3483 Encounter for supervision of other normal pregnancy, third trimester: Secondary | ICD-10-CM

## 2017-09-22 LAB — CBC
HCT: 31.3 % — ABNORMAL LOW (ref 35.0–47.0)
Hemoglobin: 10.9 g/dL — ABNORMAL LOW (ref 12.0–16.0)
MCH: 32.8 pg (ref 26.0–34.0)
MCHC: 34.6 g/dL (ref 32.0–36.0)
MCV: 94.7 fL (ref 80.0–100.0)
Platelets: 264 10*3/uL (ref 150–440)
RBC: 3.31 MIL/uL — ABNORMAL LOW (ref 3.80–5.20)
RDW: 12.7 % (ref 11.5–14.5)
WBC: 16.8 10*3/uL — ABNORMAL HIGH (ref 3.6–11.0)

## 2017-09-22 LAB — FETAL NONSTRESS TEST

## 2017-09-22 LAB — WET PREP, GENITAL
CLUE CELLS WET PREP: NONE SEEN
SPERM: NONE SEEN
Trich, Wet Prep: NONE SEEN
YEAST WET PREP: NONE SEEN

## 2017-09-22 LAB — TYPE AND SCREEN
ABO/RH(D): O POS
Antibody Screen: NEGATIVE

## 2017-09-22 MED ORDER — ACETAMINOPHEN 325 MG PO TABS
ORAL_TABLET | ORAL | Status: AC
  Administered 2017-09-22: 650 mg via ORAL
  Filled 2017-09-22: qty 2

## 2017-09-22 MED ORDER — LACTATED RINGERS IV SOLN
INTRAVENOUS | Status: DC
  Administered 2017-09-22: 1000 mL via INTRAVENOUS
  Administered 2017-09-23 (×2): via INTRAVENOUS

## 2017-09-22 MED ORDER — ACETAMINOPHEN 325 MG PO TABS
650.0000 mg | ORAL_TABLET | Freq: Four times a day (QID) | ORAL | Status: DC | PRN
  Administered 2017-09-22 – 2017-09-23 (×2): 650 mg via ORAL
  Filled 2017-09-22 (×2): qty 2

## 2017-09-22 NOTE — Progress Notes (Signed)
Routine Prenatal Care Visit  Subjective  Adrienne West is a 27 y.o. G3P2002 at 2253w5d being seen today for ongoing prenatal care.  She is currently monitored for the following issues for this high-risk pregnancy and has Supervision of normal pregnancy; Bipolar disorder (HCC); Tobacco smoking affecting pregnancy in first trimester; Poor fetal growth affecting management of mother; Labor and delivery indication for care or intervention; Mild Oligohydramnios antepartum; Breech presentation; and Indication for care in labor and delivery, antepartum on their problem list.  ----------------------------------------------------------------------------------- Patient reports no complaints.  Denies leakage of fluid. Tried to hydrate this weekend. Contractions: Not present. Vag. Bleeding: None.  Movement: Present. Denies leaking of fluid.  ----------------------------------------------------------------------------------- The following portions of the patient's history were reviewed and updated as appropriate: allergies, current medications, past family history, past medical history, past social history, past surgical history and problem list. Problem list updated.   Objective  Blood pressure (!) 104/50, weight 134 lb (60.8 kg), last menstrual period 02/05/2017, not currently breastfeeding. Pregravid weight 118 lb (53.5 kg) Total Weight Gain 16 lb (7.258 kg) Urinalysis: Urine Protein: Negative Urine Glucose: Negative  Fetal Status: Fetal Heart Rate (bpm): 125   Movement: Present  Presentation: Complete Breech  General:  Alert, oriented and cooperative. Patient is in no acute distress.  Skin: Skin is warm and dry. No rash noted.   Cardiovascular: Normal heart rate noted  Respiratory: Normal respiratory effort, no problems with respiration noted  Abdomen: Soft, gravid, appropriate for gestational age. Pain/Pressure: Absent     Pelvic:  Cervical exam deferred        Extremities: Normal range of  motion.     ental Status: Normal mood and affect. Normal behavior. Normal judgment and thought content.   NST: reactive, category I. Baseline 125, moderate variability, 15x15 accelerations, no declerations.   Assessment   27 y.o. G3P2002 at 6153w5d by  11/12/2017, by Last Menstrual Period presenting for routine prenatal visit  Plan   pregnancy 3 Problems (from 02/05/17 to present)    Problem Noted Resolved   Poor fetal growth affecting management of mother 08/28/2017 by Grotegut, [place], MD No   Overview Signed 09/18/2017  4:29 PM by Jimmey RalphLivingston, Elizabeth, MD    S/p BTM       Supervision of normal pregnancy 04/17/2017 by Oswaldo ConroySchmid, Jacelyn Y, CNM No   Overview Addendum 08/26/2017  2:31 AM by Conard NovakJackson, Stephen D, MD    Clinic [facility] Prenatal Labs  Dating  Blood type: O Pos  Genetic Screen 1 Screen:    AFP:     Quad:     NIPS: Antibody:Neg  Anatomic US  Rubella: Immune Varicella: Immune  GTT Third trimester: 89 RPR: NR  Rhogam  HBsAg: Neg  TDaP vaccine                       Flu Shot: HIV: NR  Baby Food    Breast                            GBS:   Contraception Depo &/or Nexplanon Pap:  CBB  Given info   CS/VBAC Not applicable   Support Person                  Gestational age appropriate obstetric precautions including but not limited to vaginal bleeding, contractions, leaking of fluid and fetal movement were reviewed in detail with the patient.    Patient being  followed for IUGR less that 3% and low AFI.  Patient is oligohydramnios today on [place]. Will send to labor and delivery for evaluation and management, Discussed with Dr. Jean Rosenthal who is on call and Maternal Fetal Medicine. Patient has already had betamethasone on  3/12 and 3/13.  Adelene Idler MD [facility], [facility] 09/22/2017, 6:10 PM

## 2017-09-22 NOTE — H&P (Addendum)
OB History & Physical   History of Present Illness:  Chief Complaint: low amniotic fluid  HPI:  Adrienne West is a 27 y.o. G2P2002 female at [redacted]w[redacted]d dated by LMP.  Her pregnancy has been complicated by bipolar disorder, tobacco use, oligohydramnios, poor fetal growth, breech presentation.    She denies contractions.   She denies leakage of fluid.   She denies vaginal bleeding.   She reports fetal movement.  She also reports vaginal irritation x 3 weeks and new odor and discharge. She is wondering if she has BV.  The patient was sent to the hospital from the office today for evaluation and management of oligohydramnios. AFI today was 3.21-3.56 and no 2x2 pocket. She is being followed by Ardyth Man MFM for IUGR. Patient received Betamethasone on 3/12 and 3/13.  Maternal Medical History:   Past Medical History:  Diagnosis Date  . Anxiety   . Bipolar disorder [facility])     Past Surgical History:  Procedure Laterality Date  . aspiration of breast cyst Right 2013  . LAPAROSCOPIC APPENDECTOMY N/A 2012    Allergies  Allergen Reactions  . Hydrocodone Itching    Prior to Admission medications   Medication Sig Start Date End Date Taking? Authorizing Provider  acetaminophen (TYLENOL) 500 MG tablet Take 1,000 mg by mouth every 6 (six) hours as needed.   Yes [provider]  Prenat-Methylfol-Chol-Fish Oil (PRENATAL + COMPLETE MULTI PO) Take 1 tablet by mouth daily.   Yes [provider]  ranitidine (ZANTAC) 75 MG tablet Take 75 mg by mouth daily.   Yes [provider]  escitalopram (LEXAPRO) 10 MG tablet Take 1 tablet (10 mg total) daily by mouth. Patient not taking: Reported on 08/28/2017 05/22/17   Adrienne Mall, CNM    OB History  Gravida Para Term Preterm AB Living  3 2 2  0 0 2  SAB TAB Ectopic Multiple Live Births  0 0 0 0 2    # Outcome Date GA Lbr Len/2nd Weight Sex Delivery Anes PTL Lv  3 Current           2 Term 11/09/16 [redacted]w[redacted]d / 00:02 6 lb 2.1  oz (2.78 kg) M Vag-Spont None  LIV  1 Term         LIV      Prenatal care site: [facility]  Social History: She  reports that she has been smoking cigarettes.  She has been smoking about 0.75 packs per day. She has quit using smokeless tobacco. She reports that she does not drink alcohol or use drugs.  Family History: family history includes AAA (abdominal aortic aneurysm) in her paternal grandfather; Bipolar disorder in her mother and sister; Breast cancer (age of onset: 73) in her mother; COPD in her maternal grandmother; Diabetes in her maternal grandfather; Endometriosis in her mother; Hyperlipidemia in her father; Hypertension in her father; Hypothyroidism in her maternal grandmother; Rheum arthritis in her paternal grandmother; Stroke in her father and paternal grandfather.    Review of Systems: Negative x 10 systems reviewed except as noted in the HPI.    Physical Exam:  Vital Signs: BP (!) 102/52   Pulse 84   Temp 98.4 F (36.9 C) (Oral)   Resp 18   Wt 134 lb (60.8 kg)   LMP 02/05/2017 (Approximate)   BMI 24.20 kg/m  Constitutional: Well nourished, well developed female in no acute distress.  HEENT: normal Skin: Warm and dry.  Cardiovascular: Regular rate and rhythm.   Extremity: no  evidence of DVT  Respiratory: Clear to auscultation bilateral. Normal respiratory effort Abdomen: FHT present Back: no CVAT Neuro: DTRs 2+, Cranial nerves grossly intact Psych: Alert and Oriented x3. No memory deficits. Normal mood and affect.  MS: normal gait, normal bilateral lower extremity ROM/strength/stability.  Pelvic exam: Swab for wet prep Results for Adrienne West, Adrienne West (MRN [id number]) as of 09/22/2017 22:37  Ref. Range 09/22/2017 18:17  Yeast Wet Prep HPF POC Latest Ref Range: NONE SEEN  NONE SEEN  Trich, Wet Prep Latest Ref Range: NONE SEEN  NONE SEEN  Clue Cells Wet Prep HPF POC Latest Ref Range: NONE SEEN  NONE SEEN  WBC, Wet Prep HPF POC Latest Ref Range: NONE SEEN  MANY  (A)   Results for Adrienne West, Adrienne West (MRN [id number]) as of 09/22/2017 22:37  Ref. Range 09/22/2017 17:58  WBC Latest Ref Range: 3.6 - 11.0 K/uL 16.8 (H)  RBC Latest Ref Range: 3.80 - 5.20 MIL/uL 3.31 (L)  Hemoglobin Latest Ref Range: 12.0 - 16.0 g/dL 86.510.9 (L)  HCT Latest Ref Range: 35.0 - 47.0 % 31.3 (L)  MCV Latest Ref Range: 80.0 - 100.0 fL 94.7  MCH Latest Ref Range: 26.0 - 34.0 pg 32.8  MCHC Latest Ref Range: 32.0 - 36.0 g/dL 78.434.6  RDW Latest Ref Range: 11.5 - 14.5 % 12.7  Platelets Latest Ref Range: 150 - 440 K/uL 264      Pertinent Results:  Prenatal Labs: Blood type/Rh O positive  Antibody screen negative  Rubella Immune  Varicella Immune    RPR Non reactive  HBsAg negative  HIV negative  GC negative  Chlamydia negative  Genetic screening declined  1 hour GTT 89  3 hour GTT NA  GBS unknown    Baseline FHR: 130 beats/min   Variability: moderate   Accelerations: present   Decelerations: absent Contractions: absent  Overall assessment: Category I   Assessment:  Adrienne West is a 27 y.o. 613P2002 female at 7741w5d with IUGR, Oligohydramnios, Breech, Reactive NST.   Plan:  1. Admit for observation 2. CBC, T&S, RPR 3. Hydrate with IV fluids and PO intake  4. Regular diet  5. Recheck AFI in the morning- if normal she may discharge to home. If continued oligo she will stay inpatient until delivery at 34 weeks with weekly NST/BPP/UA dopplers 6. Fetal well-being: Category I 7. C/Section if still breech  Adrienne West, CNM 09/22/2017 10:29 PM

## 2017-09-22 NOTE — Telephone Encounter (Signed)
MFM Phone Call Spoke to Dr. Jean RosenthalJackson. [name] had a repeat AFI in their clinic today, which was 5 and and no 2x2 pocket. She is at 32 5/7 weeks and pregnancy complicated by FGR at the 6%.  No evidence of ROM.  Case discussed with my partners.  Recommend: -[facility] -IV hydration and repeat AFI in am. If stll less than 6, keep in-house and recommend delivery at 34 weeks -While in-house, daily NST -[facility] -Delivery for non-reassuring fetal testing, decreased fetal movement or hypertension -She has received steroids.  Adrienne West, [place]had A, MD

## 2017-09-22 NOTE — OB Triage Note (Signed)
Pt was sent over from MD for low AFI. Pt had U/S last week and showed low fluid, rescan today showed low fluid also.

## 2017-09-23 ENCOUNTER — Observation Stay: Payer: Medicaid Other

## 2017-09-23 ENCOUNTER — Other Ambulatory Visit: Payer: Self-pay

## 2017-09-23 ENCOUNTER — Inpatient Hospital Stay
Admission: AD | Admit: 2017-09-23 | Discharge: 2017-10-04 | DRG: 787 | Disposition: A | Discharge status: Home / Self Care | Payer: Medicaid Other | Source: Ambulatory Visit | Attending: Advanced Practice Midwife | Admitting: Advanced Practice Midwife

## 2017-09-23 DIAGNOSIS — F17210 Nicotine dependence, cigarettes, uncomplicated: Secondary | ICD-10-CM | POA: Diagnosis present

## 2017-09-23 DIAGNOSIS — O365930 Maternal care for other known or suspected poor fetal growth, third trimester, not applicable or unspecified: Secondary | ICD-10-CM

## 2017-09-23 DIAGNOSIS — F319 Bipolar disorder, unspecified: Secondary | ICD-10-CM | POA: Diagnosis present

## 2017-09-23 DIAGNOSIS — K219 Gastro-esophageal reflux disease without esophagitis: Secondary | ICD-10-CM | POA: Diagnosis present

## 2017-09-23 DIAGNOSIS — O321XX0 Maternal care for breech presentation, not applicable or unspecified: Secondary | ICD-10-CM | POA: Diagnosis present

## 2017-09-23 DIAGNOSIS — O9962 Diseases of the digestive system complicating childbirth: Secondary | ICD-10-CM | POA: Diagnosis present

## 2017-09-23 DIAGNOSIS — O9081 Anemia of the puerperium: Secondary | ICD-10-CM | POA: Diagnosis not present

## 2017-09-23 DIAGNOSIS — O99344 Other mental disorders complicating childbirth: Secondary | ICD-10-CM | POA: Diagnosis present

## 2017-09-23 DIAGNOSIS — Z3A32 32 weeks gestation of pregnancy: Secondary | ICD-10-CM

## 2017-09-23 DIAGNOSIS — O4103X0 Oligohydramnios, third trimester, not applicable or unspecified: Principal | ICD-10-CM | POA: Diagnosis present

## 2017-09-23 DIAGNOSIS — D62 Acute posthemorrhagic anemia: Secondary | ICD-10-CM | POA: Diagnosis not present

## 2017-09-23 DIAGNOSIS — F419 Anxiety disorder, unspecified: Secondary | ICD-10-CM | POA: Diagnosis present

## 2017-09-23 DIAGNOSIS — O99334 Smoking (tobacco) complicating childbirth: Secondary | ICD-10-CM | POA: Diagnosis present

## 2017-09-23 DIAGNOSIS — O1403 Mild to moderate pre-eclampsia, third trimester: Secondary | ICD-10-CM | POA: Diagnosis not present

## 2017-09-23 DIAGNOSIS — Z3A33 33 weeks gestation of pregnancy: Secondary | ICD-10-CM | POA: Diagnosis not present

## 2017-09-23 DIAGNOSIS — O4100X0 Oligohydramnios, unspecified trimester, not applicable or unspecified: Secondary | ICD-10-CM | POA: Diagnosis present

## 2017-09-23 LAB — CHLAMYDIA/NGC RT PCR (ARMC ONLY)
Chlamydia Tr: NOT DETECTED
N GONORRHOEAE: NOT DETECTED

## 2017-09-23 MED ORDER — ACETAMINOPHEN 325 MG PO TABS
650.0000 mg | ORAL_TABLET | ORAL | Status: DC | PRN
  Administered 2017-09-23: 650 mg via ORAL

## 2017-09-23 MED ORDER — SIMETHICONE 80 MG PO CHEW: 80.0000 mg | CHEWABLE_TABLET | Freq: Four times a day (QID) | ORAL | Status: DC | PRN

## 2017-09-23 MED ORDER — PRENATAL MULTIVITAMIN CH
1.0000 | ORAL_TABLET | Freq: Every day | ORAL | Status: DC
  Administered 2017-09-24 – 2017-10-01 (×6): 1 via ORAL
  Filled 2017-09-23 (×7): qty 1

## 2017-09-23 MED ORDER — BUSPIRONE HCL 15 MG PO TABS
7.5000 mg | ORAL_TABLET | Freq: Two times a day (BID) | ORAL | Status: DC | PRN
  Administered 2017-09-23 – 2017-09-26 (×5): 7.5 mg via ORAL
  Filled 2017-09-23 (×7): qty 1

## 2017-09-23 MED ORDER — FAMOTIDINE 20 MG PO TABS
20.0000 mg | ORAL_TABLET | Freq: Two times a day (BID) | ORAL | Status: DC
  Administered 2017-09-24: 20 mg via ORAL
  Filled 2017-09-23: qty 1

## 2017-09-23 MED ORDER — NICOTINE 14 MG/24HR TD PT24
14.0000 mg | MEDICATED_PATCH | Freq: Every day | TRANSDERMAL | Status: DC
  Administered 2017-09-23 – 2017-10-01 (×9): 14 mg via TRANSDERMAL
  Filled 2017-09-23 (×11): qty 1

## 2017-09-23 MED ORDER — ACETAMINOPHEN 325 MG PO TABS
650.0000 mg | ORAL_TABLET | ORAL | Status: DC | PRN
  Administered 2017-09-23 – 2017-09-29 (×10): 650 mg via ORAL
  Filled 2017-09-23 (×10): qty 2

## 2017-09-23 MED ORDER — ESCITALOPRAM OXALATE 10 MG PO TABS
10.0000 mg | ORAL_TABLET | Freq: Every day | ORAL | Status: DC
  Filled 2017-09-23: qty 1

## 2017-09-23 MED ORDER — FAMOTIDINE 20 MG PO TABS
20.0000 mg | ORAL_TABLET | Freq: Two times a day (BID) | ORAL | Status: DC | PRN
  Administered 2017-09-23 – 2017-09-29 (×5): 20 mg via ORAL
  Filled 2017-09-23 (×5): qty 1

## 2017-09-23 NOTE — Progress Notes (Signed)
Date of Initial H&P: 09/22/2017  Adrienne West is a 27 y.o. L2G4010G3P2002 female at 8344w5d dated by LMP.  Her pregnancy has been complicated by bipolar disorder, tobacco use, oligohydramnios, poor fetal growth, breech presentation.    The patient was sent to the hospital from the office 09/22/2017  for evaluation and management of oligohydramnios. AFI  was 3.21-3.56 and no 2x2 pocket. [facility] for IUGR.Patient received Betamethasone on 3/12 and 3/13. She received IV hydration over night and a repeat AFI today remained low at 3.5cm. The fetal heart tracing remained reactive. Baby still breech on today's ultrasound. The physicians recommended that patient remain as an inpatient to monitor fetal well being closely. They plan to deliver baby at [redacted] weeks gestation via Cesarean section if antepartum testing and Doppler's remain reassuring. Patient desired to go home for 2-3 hours to see her children and pack for a week long stay. She is now readmitted to the maternal child unit.  Farrel Connersolleen Dannetta Lekas, CNM

## 2017-09-23 NOTE — Progress Notes (Signed)
L&D Progress Note  S: Has a headache from nicotine and caffeine withdrawal. Tylenol dulls the headache. Declines nicotine patch. Baby active  O: BP (!) 98/55 (BP Location: Right Arm)   Pulse 75   Temp 97.6 F (36.4 C) (Oral)   Resp 14   Wt 60.8 kg (134 lb)   LMP 02/05/2017 (Approximate)   BMI 24.20 kg/m   Repeat AFI=3.5cm with no 2x2 pocket Breech presentation NST reactive: baseline 130s with accelerations to 150s to 180s, moderate variability Toco: no contractions seen  A: Oligohydraminos and IUGR at 32wk6d Breech presentation Cat 1 tracing  P: Per Dr Leone BrandGrotegut: keep in house and get daily NST  [facility]-has appointment on Thursday  Deliver at 34 weeks if reassuring antepartum testing and Dopplers-Cesarean section? If  remains breech  Adrienne West, CNM

## 2017-09-23 NOTE — Final Progress Note (Signed)
Physician Final Progress Note  Patient ID: Adrienne West MRN: [id number] DOB/AGE: 28/08/1990 28 y.o.  Admit date: 09/22/2017 Admitting provider: Conard NovakStephen D Jackson, MD Discharge date: 09/23/2017   Admission Diagnoses: Oligo hydraminos at 32 weeks 5days Intrauterine growth restriction   Discharge Diagnoses:  Same as above Breech presentation  Consults: None  Significant Findings/ Diagnostic Studies: Adrienne Economylex Pawson Muchow is a 27 y.o. 103P2002 female at 7086w28d dated by LMP.  Her pregnancy has been complicated by bipolar disorder, tobacco use, oligohydramnios, poor fetal growth, breech presentation.   The patient was sent to the hospital from the office for evaluation and management of oligohydramnios. AFI  was 3.21-3.56 and no 2x2 pocket. [facility] for IUGR. Patient received Betamethasone on 3/12 and 3/13. She received IV hydration over night and a repeat AFI remained low at 3.5cm. The fetal heart tracing remained reactive. Baby still breech on today's ultrasound. The physicians recommended that patient remain as an inpatient to monitor fetal well being closely. They plan to deliver baby at [redacted] weeks gestation via Cesarean section if antepartum testing and Doppler's remain reassuring. Patient desired to go home for 2-3 hours to see her children and pack for a week long stay. She will be readmitted to the maternal child unit.  Procedures: NST/ AFI  Discharge Condition: stable Disposition: home Diet: Regular diet  Discharge Activity: Activity as tolerated  Discharge Instructions    Discharge patient   Complete by:  As directed    Discharge disposition:  01-Home or Self Care   Discharge patient date:  09/23/2017   Discontinue IV   Complete by:  As directed      Allergies as of 09/23/2017      Reactions   Hydrocodone Itching      Medication List    TAKE these medications   acetaminophen 500 MG tablet Commonly known as:   TYLENOL Take 1,000 mg by mouth every 6 (six) hours as needed.   escitalopram 10 MG tablet Commonly known as:  LEXAPRO Take 1 tablet (10 mg total) daily by mouth.   PRENATAL + COMPLETE MULTI PO Take 1 tablet by mouth daily.   ranitidine 75 MG tablet Commonly known as:  ZANTAC Take 75 mg by mouth daily.        Total time spent taking care of this patient: 40 minutes  Signed: Farrel Connersolleen Shon Mansouri 09/23/2017, 1:19 PM

## 2017-09-23 NOTE — H&P (Signed)
Obstetrics Admission History & Physical   CC: Oligo  HPI:  27 y.o. Z6X0960G3P2002 @ 6447w6d (11/12/2017, by Last Menstrual Period). Admitted on 09/23/2017:   Patient Active Problem List   Diagnosis Date Noted  . Oligohydramnios 09/23/2017  . Indication for care in labor and delivery, antepartum 09/22/2017  . Mild Oligohydramnios antepartum 09/18/2017  . Breech presentation 09/18/2017  . Labor and delivery indication for care or intervention 09/17/2017  . Poor fetal growth affecting management of mother 08/28/2017  . Pregnancy, supervision, high-risk, third trimester 04/17/2017  . Bipolar disorder (HCC) 04/17/2017  . Tobacco smoking affecting pregnancy in first trimester 04/17/2017    Adrienne MannanAlex Pawson Smithis a 27 y.o.G3P2002 female at 30101w5d dated by LMP. Her pregnancy has been complicated bybipolar disorder, tobacco use, oligohydramnios, poor fetal growth, breech presentation.   The patient was sent to the hospital from the office 09/22/2017  for evaluation and management of oligohydramnios. AFI was 3.21-3.56 and no 2x2 pocket. She [facility].Patient received Betamethasone on 3/12 and 3/13. She received IV hydration over night and a repeat AFI today remained low at 3.5cm. The fetal heart tracing remained reactive. Baby still breech on today's ultrasound. The physicians recommended that patient remain as an inpatient to monitor fetal well being closely. They plan to deliver baby at [redacted] weeks gestation via Cesarean section if antepartum testing and Doppler's remain reassuring. Patient desired to go home for 2-3 hours to see her children and pack for a week long stay. She is now readmitted to the maternal child unit  ROS: A review of systems was performed and negative, except as stated in the above HPI. PMHx:  Past Medical History:  Diagnosis Date  . Anxiety   . Bipolar disorder [facility](HCC)    PSHx:  Past Surgical History:  Procedure Laterality  Date  . aspiration of breast cyst Right 2013  . LAPAROSCOPIC APPENDECTOMY N/A 2012   Medications:  Medications Prior to Admission  Medication Sig Dispense Refill Last Dose  . acetaminophen (TYLENOL) 500 MG tablet Take 1,000 mg by mouth every 6 (six) hours as needed.   09/22/2017 at Unknown time  . escitalopram (LEXAPRO) 10 MG tablet Take 1 tablet (10 mg total) daily by mouth. (Patient not taking: Reported on 08/28/2017) 30 tablet 3 Unknown at Unknown time  . Prenat-Methylfol-Chol-Fish Oil (PRENATAL + COMPLETE MULTI PO) Take 1 tablet by mouth daily.   09/22/2017 at Unknown time  . ranitidine (ZANTAC) 75 MG tablet Take 75 mg by mouth daily.   09/22/2017 at Unknown time   Allergies: is allergic to hydrocodone. OBHx:  OB History  Gravida Para Term Preterm AB Living  3 2 2  0 0 2  SAB TAB Ectopic Multiple Live Births  0 0 0 0 2    # Outcome Date GA Lbr Len/2nd Weight Sex Delivery Anes PTL Lv  3 Current           2 Term 11/09/16 2644w1d / 00:02 6 lb 2.1 oz (2.78 kg) M Vag-Spont None  LIV  1 Term         LIV     AVW:UJWJXBJY/[id number]:Negative/unremarkable except as detailed in HPI.Marland Kitchen.  No family history of birth defects. Soc Hx: Current smoker, Alcohol: none and Recreational drug use: none  Objective:   Vitals:   09/23/17 1720 09/23/17 1936  BP: 107/63 (!) 111/56  Pulse: 88 80  Resp: 18 18  Temp: 98.6 F (37 C) 97.8 F (36.6 C)  SpO2: 99%  100%   Constitutional: Well nourished, well developed female in no acute distress.  HEENT: normal Skin: Warm and dry.  Cardiovascular:Regular rate and rhythm.   Extremity: trace to 1+ bilateral pedal edema Respiratory: Clear to auscultation bilateral. Normal respiratory effort Abdomen: gravid NT Back: no CVAT Neuro: DTRs 2+, Cranial nerves grossly intact Psych: Alert and Oriented x3. No memory deficits. Normal mood and affect.  MS: normal gait, normal bilateral lower extremity ROM/strength/stability.  FHT 140s NST R earlier today   Perinatal info:  Blood  type: O positive Rubella- Immune Varicella -Immune TDaP Given during third trimester of this pregnancy RPR NR / HIV Neg/ HBsAg Neg   Assessment & Plan:   27 y.o. Z6X0960 @ [redacted]w[redacted]d, Admitted on 09/23/2017: Oligo and IUGR, 32 weeks Daily fetal monitoring; counseled as to risks of waiting and the risks of delivery w prematurity and CS (for breech). [place] w Dopplers and AFI Thurs. NST daily.  FHT q 8 hours. Plan CS 34 weeks, scheduled for 10/02/17.   Annamarie Major, MD, [facility], [facility] 09/23/2017  9:16 PM

## 2017-09-24 DIAGNOSIS — O321XX0 Maternal care for breech presentation, not applicable or unspecified: Secondary | ICD-10-CM

## 2017-09-24 DIAGNOSIS — O365930 Maternal care for other known or suspected poor fetal growth, third trimester, not applicable or unspecified: Secondary | ICD-10-CM

## 2017-09-24 DIAGNOSIS — O1403 Mild to moderate pre-eclampsia, third trimester: Secondary | ICD-10-CM

## 2017-09-24 DIAGNOSIS — Z3A32 32 weeks gestation of pregnancy: Secondary | ICD-10-CM

## 2017-09-24 LAB — RPR: RPR: NONREACTIVE

## 2017-09-24 MED ORDER — HYDROXYZINE HCL 25 MG PO TABS
25.0000 mg | ORAL_TABLET | Freq: Four times a day (QID) | ORAL | Status: DC | PRN
  Administered 2017-09-24 – 2017-10-01 (×10): 25 mg via ORAL
  Filled 2017-09-24 (×10): qty 1

## 2017-09-24 MED ORDER — BUTALBITAL-APAP-CAFFEINE 50-325-40 MG PO TABS
2.0000 | ORAL_TABLET | Freq: Four times a day (QID) | ORAL | Status: DC | PRN
  Administered 2017-09-24 – 2017-09-29 (×6): 2 via ORAL
  Filled 2017-09-24 (×10): qty 2

## 2017-09-24 NOTE — Progress Notes (Signed)
Antepartum Note:  27 y.o. G3P2002 @ [redacted] weeks gestation (EDC=11/12/2017, by Last Menstrual Period). Admitted on 09/23/2017 for oligo hydraminos, IUGR, breech presentation.  Prenatal care also remarkable for  bipolar disorder, anxiety, and tobacco smoking.   S: Doing well this AM. Baby active. Started Buspar last night. Lexapro discontinued. Unable to tolerate in the past.  O: BP 107/60 (BP Location: Left Arm)   Pulse 72   Temp 98 F (36.7 C) (Oral)   Resp 20   Ht 5\' 2"  (1.575 m)   Wt 60.8 kg (134 lb)   LMP 02/05/2017 (Approximate)   SpO2 99%   BMI 24.51 kg/m    General: WF in NAD Respiratory: normal respiratory effort Abdomen: gravid, soft, NT Extremities: TED hose on  FHR: baseline 135 with accelerations to 160s to 170, moderate variability Toco: no contractions seen  A: IUP at 33 weeks with oligohydraminos and IUGR Reactive NST  P: US w Dopplers and AFI Thurs. [facility].  NST daily.  FHT q 8 hours. Plan CS 34 weeks, scheduled for 10/02/17.  Farrel Connersolleen Adi Doro, CNM

## 2017-09-25 ENCOUNTER — Inpatient Hospital Stay
Admission: RE | Admit: 2017-09-25 | Discharge: 2017-09-25 | Disposition: A | Discharge status: Home / Self Care | Payer: Medicaid Other | Source: Ambulatory Visit | Attending: Certified Nurse Midwife | Admitting: Certified Nurse Midwife

## 2017-09-25 DIAGNOSIS — Z3A32 32 weeks gestation of pregnancy: Secondary | ICD-10-CM

## 2017-09-25 DIAGNOSIS — O365930 Maternal care for other known or suspected poor fetal growth, third trimester, not applicable or unspecified: Secondary | ICD-10-CM

## 2017-09-25 DIAGNOSIS — O1403 Mild to moderate pre-eclampsia, third trimester: Secondary | ICD-10-CM

## 2017-09-25 DIAGNOSIS — O321XX0 Maternal care for breech presentation, not applicable or unspecified: Secondary | ICD-10-CM

## 2017-09-25 NOTE — Progress Notes (Signed)
Antepartum Progress Note  Adrienne West is a 27 year old G3P2002 at 33w 1d currently admitted for oligohydramnios and IUGR. She has Pregnancy, supervision, high-risk, third trimester; Bipolar disorder (HCC); Tobacco smoking affecting pregnancy; Poor fetal growth affecting management of mother; Oligohydramnios antepartum; and Breech presentation on her problem list.  S: Sitting up in bed, calm, cooperative. Asking questions about the plan of care and Cesarean surgery.  O: Blood pressure (!) 87/52, pulse 76, temperature 98.1 F (36.7 C), temperature source Oral, resp. rate 18, height 5\' 2"  (1.575 m), weight 134 lb (60.8 kg), last menstrual period 02/05/2017, SpO2 98 %.  Constitutional: NAD Respiratory: No increased work of breathing Abdomen: non-tender Extremities: no evidence of DVT Psychiatric: Normal mood and affect  NST:  Baseline: 145 Variability: moderate Accelerations: present Decelerations: absent Tocometry: quiey The patient was monitored for 30+ minutes, fetal heart rate tracing was deemed reactive, Category I tracing.  A: Single intrauterine pregnancy at 33w 1d, continuing oligohydramnios and IUGR, breech presentation, reactive NST.  P[facility] BPP 6/8 (-2 for low fluid); AFI 3.1 cm, see report for UA Doppler studies. NST daily and FHT every 8 hours. [facility] agreed with plan for Cesarean delivery at 34 weeks, earlier if antepartum testing becomes non-reassuring or maternal hypertension develops. NICU consult before delivery. Patient wishes to discuss plans for anesthesia with MD, history of anxiety and a difficult experience during prior surgery.  Marcelyn BruinsJacelyn Lalana Wachter, CNM 09/25/2017

## 2017-09-25 NOTE — Progress Notes (Signed)
[facility] via wheelchair by Salli RealJenn Miller, RN.

## 2017-09-26 ENCOUNTER — Encounter: Payer: Self-pay | Admitting: Anesthesiology

## 2017-09-26 DIAGNOSIS — Z3A32 32 weeks gestation of pregnancy: Secondary | ICD-10-CM

## 2017-09-26 DIAGNOSIS — O1403 Mild to moderate pre-eclampsia, third trimester: Secondary | ICD-10-CM

## 2017-09-26 DIAGNOSIS — O321XX0 Maternal care for breech presentation, not applicable or unspecified: Secondary | ICD-10-CM

## 2017-09-26 DIAGNOSIS — O365930 Maternal care for other known or suspected poor fetal growth, third trimester, not applicable or unspecified: Secondary | ICD-10-CM

## 2017-09-26 NOTE — Anesthesia Preprocedure Evaluation (Addendum)
Anesthesia Evaluation  Patient identified by MRN, date of birth, ID band Patient awake    Reviewed: Allergy & Precautions, H&P , NPO status , Patient's Chart, lab work & pertinent test results  History of Anesthesia Complications (+) POST - OP SPINAL HEADACHE and history of anesthetic complications  Airway Mallampati: II  TM Distance: <3 FB Neck ROM: full    Dental  (+) Chipped, Poor Dentition, Missing   Pulmonary neg shortness of breath, Current Smoker,           Cardiovascular Exercise Tolerance: Good (-) angina(-) Past MI negative cardio ROS       Neuro/Psych PSYCHIATRIC DISORDERS Anxiety Bipolar Disorder    GI/Hepatic GERD  Controlled and Medicated,  Endo/Other    Renal/GU   negative genitourinary   Musculoskeletal   Abdominal   Peds  Hematology negative hematology ROS (+)   Anesthesia Other Findings Past Medical History: No date: Anxiety No date: Bipolar disorder [facility](HCC)  Past Surgical History: 2013: aspiration of breast cyst; Right 2012: LAPAROSCOPIC APPENDECTOMY; N/A     Reproductive/Obstetrics (+) Pregnancy                            Anesthesia Physical Anesthesia Plan  ASA: II  Anesthesia Plan: Spinal   Post-op Pain Management:    Induction:   PONV Risk Score and Plan:   Airway Management Planned: Natural Airway and Nasal Cannula  Additional Equipment:   Intra-op Plan:   Post-operative Plan:   Informed Consent: I have reviewed the patients History and Physical, chart, labs and discussed the procedure including the risks, benefits and alternatives for the proposed anesthesia with the patient or authorized representative who has indicated his/her understanding and acceptance.   Dental Advisory Given  Plan Discussed with: Anesthesiologist, CRNA and Surgeon  Anesthesia Plan Comments: (Patient seen for anesthesia pre op eval   Patient reports no bleeding  problems and no anticoagulant use.  Plan for spinal with backup GA  Patient consented for risks of anesthesia including but not limited to:  - adverse reactions to medications - risk of bleeding, infection, nerve damage and headache - risk of failed spinal - damage to teeth, lips or other oral mucosa - sore throat or hoarseness - Damage to heart, brain, lungs or loss of life  Patient voiced understanding.)        Anesthesia Quick Evaluation

## 2017-09-26 NOTE — Progress Notes (Signed)
Spoke with patient about need for social work. She stated she has her mental health issues (bipolar, PTSD, anxiety) under control and did not need to speak to anyone about it. Also spoke with her about resources available for financial reasons and she stated she also had that under control right now. I told her to let me know if she changes her mind and decides she wants to speak with someone.

## 2017-09-26 NOTE — Progress Notes (Signed)
Antepartum Progress Note  Adrienne West is a 27 year old G3P2002 at 33w 2d who is currently admitted for oligohydramnios with IUGR.   Patient Active Problem List   Diagnosis Date Noted  . Oligohydramnios 09/23/2017  . Breech presentation 09/18/2017  . Poor fetal growth affecting management of mother 08/28/2017  . Pregnancy, supervision, high-risk, third trimester 04/17/2017  . Bipolar disorder (HCC) 04/17/2017  . Tobacco smoking affecting pregnancy in first trimester 04/17/2017   S: Cheerful, looking forward to visit from family members. Denies complaints.  O: Blood pressure (!) 91/56, pulse 79, temperature 97.7 F (36.5 C), temperature source Oral, resp. rate 18, height 5\' 2"  (1.575 m), weight 134 lb (60.8 kg), last menstrual period 02/05/2017, SpO2 99 %.  Physical Exam  Constitutional: She is oriented to person, place, and time. No distress.  Pulmonary/Chest: Effort normal.  Abdominal: She exhibits no distension. There is no tenderness.  Musculoskeletal: Normal range of motion. She exhibits no edema or tenderness.  Neurological: She is alert and oriented to person, place, and time.  Psychiatric: She has a normal mood and affect. Her behavior is normal.  Vitals reviewed.  NST: Baseline: 135 Variability: moderate Accelerations: present Decelerations: absent Tocometry: quiet The patient was monitored for 20 minutes, fetal heart rate tracing was deemed reactive, category I tracing.  A: 27 year old G3P2002 admitted for oligohydramnios with reassuring fetal well being.  P: Anesthesiology consult ordered to address patient's concerns about surgical anesthesia during scheduled Cesarean. Continue daily NST and q 8h FHT. Remain inpatient pending delivery, currently scheduled Cesarean at 34 weeks (10/02/2017).  Marcelyn BruinsJacelyn Schmid, CNM 09/26/2017  4:02 PM

## 2017-09-27 DIAGNOSIS — O365930 Maternal care for other known or suspected poor fetal growth, third trimester, not applicable or unspecified: Secondary | ICD-10-CM

## 2017-09-27 DIAGNOSIS — O1403 Mild to moderate pre-eclampsia, third trimester: Secondary | ICD-10-CM

## 2017-09-27 DIAGNOSIS — Z3A32 32 weeks gestation of pregnancy: Secondary | ICD-10-CM

## 2017-09-27 DIAGNOSIS — O321XX0 Maternal care for breech presentation, not applicable or unspecified: Secondary | ICD-10-CM

## 2017-09-27 NOTE — Progress Notes (Signed)
Patient ID: [name], female   DOB: [birth year], 27 y.o.   MRN: [id number]  Daily Antepartum Note  Admission Date: 09/23/2017 Current Date: 09/27/2017 2:08 PM  Adrienne West is a [name] @ [redacted]w[redacted]d, HD#6, admitted for fetal growth restriction, oligohydramnios, in-house monitoring.  Pregnancy complicated by: The above Patient Active Problem List   Diagnosis Date Noted  . Oligohydramnios 09/23/2017  . Indication for care in labor and delivery, antepartum 09/22/2017  . Mild Oligohydramnios antepartum 09/18/2017  . Breech presentation 09/18/2017  . Labor and delivery indication for care or intervention 09/17/2017  . Poor fetal growth affecting management of mother 08/28/2017  . Pregnancy, supervision, high-risk, third trimester 04/17/2017  . Bipolar disorder (HCC) 04/17/2017  . Tobacco smoking affecting pregnancy in first trimester 04/17/2017    Overnight/24hr events:  No acute events  Subjective:  Notes +FM, no LOF, no vaginal bleeding, no contractions.  Objective:   Vitals:   09/27/17 0343 09/27/17 0900  BP: (!) 94/54 97/60  Pulse: 92 89  Resp: 18 18  Temp: 97.8 F (36.6 C) 97.8 F (36.6 C)  SpO2: 99% 99%   Temp:  [97.6 F (36.4 C)-98.5 F (36.9 C)] 97.8 F (36.6 C) (03/23 0900) Pulse Rate:  [77-92] 89 (03/23 0900) Resp:  [14-18] 18 (03/23 0900) BP: (94-114)/(54-66) 97/60 (03/23 0900) SpO2:  [97 %-99 %] 99 % (03/23 0900) Temp (24hrs), Avg:97.9 F (36.6 C), Min:97.6 F (36.4 C), Max:98.5 F (36.9 C)   Intake/Output Summary (Last 24 hours) at 09/27/2017 1408 Last data filed at 09/27/2017 1032 Gross per 24 hour  Intake 960 ml  Output -  Net 960 ml     Current Vital Signs 24h Vital Sign Ranges  T 97.8 F (36.6 C) Temp  Avg: 97.9 F (36.6 C)  Min: 97.6 F (36.4 C)  Max: 98.5 F (36.9 C)  BP 97/60 BP  Min: 94/54  Max: 114/66  HR 89 Pulse  Avg: 85.4  Min: 77  Max: 92  RR 18 Resp  Avg: 17.2  Min: 14  Max: 18  SaO2 99 % Room Air SpO2  Avg: 98.6 %   Min: 97 %  Max: 99 %       24 Hour I/O Current Shift I/O  Time Ins Outs 03/22 0701 - 03/23 0700 In: 1200 [P.O.:1200] Out: -  03/23 0701 - 03/23 1900 In: 480 [P.O.:480] Out: -    Patient Vitals for the past 24 hrs:  BP Temp Temp src Pulse Resp SpO2  09/27/17 0900 97/60 97.8 F (36.6 C) Oral 89 18 99 %  09/27/17 0343 (!) 94/54 97.8 F (36.6 C) Oral 92 18 99 %  09/27/17 0026 (!) 102/55 97.8 F (36.6 C) Oral 77 14 97 %  09/26/17 2110 114/66 97.6 F (36.4 C) Oral 88 18 99 %  09/26/17 1558 (!) 113/59 98.5 F (36.9 C) Oral 81 18 99 %    Physical exam: General: Well nourished, well developed female in no acute distress. Abdomen: gravid, NT Cardiovascular: RRR Respiratory: CTAB Extremities: no clubbing, cyanosis or edema Skin: Warm and dry.   NST: Baseline FHR: 135 beats/min Variability: moderate Accelerations: present Decelerations: absent Tocometry: quiet  Interpretation:  INDICATIONS: fetal growth restriction, oligohydramnios RESULTS:  A NST procedure was performed with FHR monitoring and a normal baseline established, appropriate time of 20-40 minutes of evaluation, and accels >2 seen w 15x15 characteristics.  Results show a REACTIVE NST.    Medications: Current Facility-Administered Medications  Medication Dose Route Frequency  Provider Last Rate Last Dose  . acetaminophen (TYLENOL) tablet 650 mg  650 mg Oral Q4H PRN Farrel ConnersGutierrez, Colleen, CNM   650 mg at 09/27/17 0347  . busPIRone (BUSPAR) tablet 7.5 mg  7.5 mg Oral BID PRN Nadara MustardHarris, Robert P, MD   7.5 mg at 09/26/17 1301  . butalbital-acetaminophen-caffeine (FIORICET, ESGIC) 50-325-40 MG per tablet 2 tablet  2 tablet Oral Q6H PRN Farrel ConnersGutierrez, Colleen, CNM   2 tablet at 09/25/17 2157  . famotidine (PEPCID) tablet 20 mg  20 mg Oral BID PRN Farrel ConnersGutierrez, Colleen, CNM   20 mg at 09/26/17 1301  . hydrOXYzine (ATARAX/VISTARIL) tablet 25 mg  25 mg Oral Q6H PRN Farrel ConnersGutierrez, Colleen, CNM   25 mg at 09/26/17 1301  . nicotine (NICODERM CQ  - dosed in mg/24 hours) patch 14 mg  14 mg Transdermal Daily Farrel ConnersGutierrez, Colleen, CNM   14 mg at 09/27/17 1031  . prenatal multivitamin tablet 1 tablet  1 tablet Oral Q1200 Farrel ConnersGutierrez, Colleen, CNM   1 tablet at 09/25/17 1040  . simethicone (MYLICON) chewable tablet 80 mg  80 mg Oral QID PRN Nadara MustardHarris, Robert P, MD        Labs:  Recent Labs  Lab 09/22/17 1758  WBC 16.8*  HGB 10.9*  HCT 31.3*  PLT 264    Assessment & Plan:  27 y.o. 233P2002 female at 555w3d admitted for inpatient monitoring of fetal growth restriction, oligohydramnios *Pregnancy:PNV, Q8 hour dopp tones, daily NST, delivery scheduled for 34 weeks by c-section (breech) *PPx: TED hose, ambulation *FEN/GI:regular diet, no IV currently *Dispo: home after delivery  Thomasene MohairStephen Alvetta Hidrogo, MD, Merlinda FrederickFACOG [facility], [facility] 3/23/219 8:30 PM

## 2017-09-28 DIAGNOSIS — O1403 Mild to moderate pre-eclampsia, third trimester: Secondary | ICD-10-CM

## 2017-09-28 DIAGNOSIS — Z3A33 33 weeks gestation of pregnancy: Secondary | ICD-10-CM

## 2017-09-28 DIAGNOSIS — O365930 Maternal care for other known or suspected poor fetal growth, third trimester, not applicable or unspecified: Secondary | ICD-10-CM

## 2017-09-28 DIAGNOSIS — O321XX0 Maternal care for breech presentation, not applicable or unspecified: Secondary | ICD-10-CM

## 2017-09-28 NOTE — Procedures (Signed)
Non Stress Test Baseline FHR: 130 beats/min Variability: moderate Accelerations: present Decelerations: absent Tocometry: quiet  Interpretation:  INDICATIONS: Fetal growth restriction (Intrauterine growth restriction), oligohydramnios RESULTS:  A NST procedure was performed with FHR monitoring and a normal baseline established, appropriate time of 20-40 minutes of evaluation, and accels >2 seen w 15x15 characteristics.  Results show a REACTIVE NST.    Thomasene MohairStephen Muaad Boehning, MD, Merlinda FrederickFACOG [facility], [facility] 09/28/2017 4:46 PM

## 2017-09-28 NOTE — Progress Notes (Signed)
Patient ID: Adrienne West, female   DOB: 04/07/1991, 27 y.o.   MRN: [id number]  Daily Antepartum Note  Admission Date: 09/23/2017 Current Date: 09/28/2017 10:00 AM  Adrienne West is a 27 y.o. [id number] @ 6570w4d, HD#7, admitted for fetal growth restriction, oligohydramnios, in-house monitoring and delivery.  Pregnancy complicated by: Patient Active Problem List   Diagnosis Date Noted  . Oligohydramnios 09/23/2017  . Indication for care in labor and delivery, antepartum 09/22/2017  . Mild Oligohydramnios antepartum 09/18/2017  . Breech presentation 09/18/2017  . Labor and delivery indication for care or intervention 09/17/2017  . Poor fetal growth affecting management of mother 08/28/2017  . Pregnancy, supervision, high-risk, third trimester 04/17/2017  . Bipolar disorder (HCC) 04/17/2017  . Tobacco smoking affecting pregnancy in first trimester 04/17/2017    Overnight/24hr events:  No acute events  Subjective:  No acute complaints. Notes +FM, no LOF, no vaginal bleeding, and no contractions  Objective:   Vitals:   09/28/17 0449 09/28/17 0835  BP: (!) 102/53 103/62  Pulse: 71 83  Resp: 20 18  Temp: 97.8 F (36.6 C) 97.9 F (36.6 C)  SpO2: 98% 99%   Temp:  [97.7 F (36.5 C)-98.5 F (36.9 C)] 97.9 F (36.6 C) (03/24 0835) Pulse Rate:  [71-99] 83 (03/24 0835) Resp:  [18-20] 18 (03/24 0835) BP: (95-107)/(48-62) 103/62 (03/24 0835) SpO2:  [97 %-99 %] 99 % (03/24 0835) Temp (24hrs), Avg:98 F (36.7 C), Min:97.7 F (36.5 C), Max:98.5 F (36.9 C)   Intake/Output Summary (Last 24 hours) at 09/28/2017 1000 Last data filed at 09/28/2017 0950 Gross per 24 hour  Intake 960 ml  Output -  Net 960 ml     Current Vital Signs 24h Vital Sign Ranges  T 97.9 F (36.6 C) Temp  Avg: 98 F (36.7 C)  Min: 97.7 F (36.5 C)  Max: 98.5 F (36.9 C)  BP 103/62 BP  Min: 95/48  Max: 107/62  HR 83 Pulse  Avg: 84.2  Min: 71  Max: 99  RR 18 Resp  Avg: 19.2  Min: 18  Max: 20  SaO2 99 %  Room Air SpO2  Avg: 98 %  Min: 97 %  Max: 99 %       24 Hour I/O Current Shift I/O  Time Ins Outs 03/23 0701 - 03/24 0700 In: 480 [P.O.:480] Out: -  03/24 0701 - 03/24 1900 In: 480 [P.O.:480] Out: -    Patient Vitals for the past 24 hrs:  BP Temp Temp src Pulse Resp SpO2  09/28/17 0835 103/62 97.9 F (36.6 C) Oral 83 18 99 %  09/28/17 0449 (!) 102/53 97.8 F (36.6 C) Oral 71 20 98 %  09/27/17 2354 (!) 95/48 98.3 F (36.8 C) Oral 82 20 97 %  09/27/17 2021 (!) 104/55 98.5 F (36.9 C) Oral 86 20 98 %  09/27/17 1611 107/62 97.7 F (36.5 C) - 99 18 98 %    Physical exam: General: Well nourished, well developed female in no acute distress. Abdomen: gravid NT Cardiovascular: regular rate and rhythm Respiratory: CTAB Extremities: no clubbing, cyanosis or edema Skin: Warm and dry.   Medications: Current Facility-Administered Medications  Medication Dose Route Frequency Provider Last Rate Last Dose  . acetaminophen (TYLENOL) tablet 650 mg  650 mg Oral Q4H PRN Farrel ConnersGutierrez, Colleen, CNM   650 mg at 09/28/17 [id number]  . busPIRone (BUSPAR) tablet 7.5 mg  7.5 mg Oral BID PRN Nadara MustardHarris, Robert P, MD   7.5 mg at 09/26/17 1301  .  butalbital-acetaminophen-caffeine (FIORICET, ESGIC) 50-325-40 MG per tablet 2 tablet  2 tablet Oral Q6H PRN Farrel Conners, CNM   2 tablet at 09/25/17 2157  . famotidine (PEPCID) tablet 20 mg  20 mg Oral BID PRN Farrel Conners, CNM   20 mg at 09/26/17 1301  . hydrOXYzine (ATARAX/VISTARIL) tablet 25 mg  25 mg Oral Q6H PRN Farrel Conners, CNM   25 mg at 09/27/17 2025  . nicotine (NICODERM CQ - dosed in mg/24 hours) patch 14 mg  14 mg Transdermal Daily Farrel Conners, CNM   14 mg at 09/27/17 1031  . prenatal multivitamin tablet 1 tablet  1 tablet Oral Q1200 Farrel Conners, CNM   1 tablet at 09/25/17 1040  . simethicone (MYLICON) chewable tablet 80 mg  80 mg Oral QID PRN Nadara Mustard, MD        Labs:  Recent Labs  Lab 09/22/17 1758  WBC 16.8*  HGB  10.9*  HCT 31.3*  PLT 264     Assessment & Plan:  27 y.o. G3P2002 female at [redacted]w[redacted]d admitted for inpatient monitoring and delivery at 34 weeks for fetal growth restriction, oligohydramnios *Pregnancy:PNV, Q8 hour dopp tones, daily NST, delivery scheduled for 34 weeks by c-section due to breech *Preterm: s/p BMTZ on 3/12 and 3/13 *PPx: TED hose, ambulation *FEN/GI:regular diet, no IV currently *Dispo: home after delivery  Thomasene Mohair, MD, [facility], [facility] 09/28/2017 10:03 AM

## 2017-09-29 ENCOUNTER — Encounter: Payer: Medicaid Other | Admitting: Obstetrics and Gynecology

## 2017-09-29 ENCOUNTER — Other Ambulatory Visit: Payer: Medicaid Other

## 2017-09-29 DIAGNOSIS — Z3A33 33 weeks gestation of pregnancy: Secondary | ICD-10-CM

## 2017-09-29 DIAGNOSIS — O321XX0 Maternal care for breech presentation, not applicable or unspecified: Secondary | ICD-10-CM

## 2017-09-29 DIAGNOSIS — O1403 Mild to moderate pre-eclampsia, third trimester: Secondary | ICD-10-CM

## 2017-09-29 DIAGNOSIS — O365930 Maternal care for other known or suspected poor fetal growth, third trimester, not applicable or unspecified: Secondary | ICD-10-CM

## 2017-09-29 NOTE — Progress Notes (Signed)
   09/29/17 1510  Clinical Encounter Type  Visited With Patient and family together  Visit Type Follow-up  Referral From Nurse  Consult/Referral To Chaplain   While rounding on unit, staff suggested that chaplain check in on patient.  Patient and visitor declined chaplain visit.

## 2017-09-29 NOTE — Progress Notes (Signed)
Patient ID: Adrienne West, female   DOB: 03/29/1991, 27 y.o.   MRN: [id number]  Daily Antepartum Note  Admission Date: 09/23/2017 Current Date: 09/29/2017 1:39 PM  Adrienne West is a 27 y.o. [id number] @ 3543w5d, HD#7, admitted for fetal growth restriction, oligohydramnios, in-house monitoring and delivery. Seen by DP on 3/21 and AFI was 3.1cm. Dopplers were >97th%, but no reverse end diastolic flow. BPP was 6/8 (-2 for amniotic fluid) Pregnancy complicated by: Patient Active Problem List   Diagnosis Date Noted  . Oligohydramnios 09/23/2017  . Indication for care in labor and delivery, antepartum 09/22/2017  . Mild Oligohydramnios antepartum 09/18/2017  . Breech presentation 09/18/2017  . Labor and delivery indication for care or intervention 09/17/2017  . Poor fetal growth affecting management of mother 08/28/2017  . Pregnancy, supervision, high-risk, third trimester 04/17/2017  . Bipolar disorder (HCC) 04/17/2017  . Tobacco smoking affecting pregnancy in first trimester 04/17/2017    Overnight/24hr events:  No acute events  Subjective:  No acute complaints. Notes +FM, no LOF, no vaginal bleeding, and no contractions  Objective:   Vitals:   09/29/17 0739 09/29/17 1102  BP: (!) 94/59 104/60  Pulse: 81 89  Resp: 18 20  Temp: 97.8 F (36.6 C) 98.3 F (36.8 C)  SpO2: 98%    Temp:  [97.8 F (36.6 C)-98.3 F (36.8 C)] 98.3 F (36.8 C) (03/25 1102) Pulse Rate:  [68-89] 89 (03/25 1102) Resp:  [16-20] 20 (03/25 1102) BP: (94-106)/(48-63) 104/60 (03/25 1102) SpO2:  [98 %-100 %] 98 % (03/25 0739) Temp (24hrs), Avg:98 F (36.7 C), Min:97.8 F (36.6 C), Max:98.3 F (36.8 C)   Patient Vitals for the past 24 hrs:  BP Temp Temp src Pulse Resp SpO2  09/29/17 1102 104/60 98.3 F (36.8 C) Oral 89 20 -  09/29/17 0739 (!) 94/59 97.8 F (36.6 C) Oral 81 18 98 %  09/29/17 0349 (!) 96/48 97.8 F (36.6 C) Oral 68 16 98 %  09/28/17 2336 (!) 95/53 98.1 F (36.7 C) Oral 75 20 98 %   09/28/17 2022 97/63 98 F (36.7 C) Oral 71 16 100 %  09/28/17 1515 (!) 106/55 98 F (36.7 C) Oral 79 20 98 %    Physical exam: General: petite gravid  female in no acute distress. Abdomen: gravid NT Cardiovascular: regular rate  Respiratory: normal respiratory effort Extremities: no clubbing, cyanosis or edema Skin: Warm and dry.   FHR 125-130 baseline with accelerations to 150s to 160s, moderate variability Toco: some irritability   Medications: Current Facility-Administered Medications  Medication Dose Route Frequency Provider Last Rate Last Dose  . acetaminophen (TYLENOL) tablet 650 mg  650 mg Oral Q4H PRN Farrel ConnersGutierrez, Shamarcus Hoheisel, CNM   650 mg at 09/29/17 [id number]  . busPIRone (BUSPAR) tablet 7.5 mg  7.5 mg Oral BID PRN Nadara MustardHarris, Robert P, MD   7.5 mg at 09/26/17 1301  . butalbital-acetaminophen-caffeine (FIORICET, ESGIC) 50-325-40 MG per tablet 2 tablet  2 tablet Oral Q6H PRN Farrel ConnersGutierrez, Jaymon Dudek, CNM   2 tablet at 09/25/17 2157  . famotidine (PEPCID) tablet 20 mg  20 mg Oral BID PRN Farrel ConnersGutierrez, Makenzie Weisner, CNM   20 mg at 09/28/17 1807  . hydrOXYzine (ATARAX/VISTARIL) tablet 25 mg  25 mg Oral Q6H PRN Farrel ConnersGutierrez, Calli Bashor, CNM   25 mg at 09/27/17 2025  . nicotine (NICODERM CQ - dosed in mg/24 hours) patch 14 mg  14 mg Transdermal Daily Farrel ConnersGutierrez, Callen Vancuren, CNM   14 mg at 09/29/17 1122  . prenatal multivitamin tablet 1  tablet  1 tablet Oral Q1200 Farrel Conners, CNM   1 tablet at 09/29/17 1120  . simethicone (MYLICON) chewable tablet 80 mg  80 mg Oral QID PRN Nadara Mustard, MD        Labs:  Recent Labs  Lab 09/22/17 1758  WBC 16.8*  HGB 10.9*  HCT 31.3*  PLT 264     Assessment & Plan:  27 y.o. G3P2002 female at [redacted]w[redacted]d admitted for inpatient monitoring and delivery at 34 weeks for fetal growth restriction, oligohydramnios, breech presentation  Reactive NST *Pregnancy:PNV, Q8 hour dopp tones, daily NST, delivery scheduled for 34 weeks by c-section due to breech *Preterm: s/p BMTZ on  3/12 and 3/13 *PPx: TED hose, ambulation *FEN/GI:regular diet, no IV currently *Dispo: home after delivery  Farrel Conners, CNM [facility], [facility] 09/29/2017 1:39 PM

## 2017-09-30 DIAGNOSIS — O365930 Maternal care for other known or suspected poor fetal growth, third trimester, not applicable or unspecified: Secondary | ICD-10-CM

## 2017-09-30 DIAGNOSIS — O1403 Mild to moderate pre-eclampsia, third trimester: Secondary | ICD-10-CM

## 2017-09-30 DIAGNOSIS — Z3A33 33 weeks gestation of pregnancy: Secondary | ICD-10-CM

## 2017-09-30 DIAGNOSIS — O321XX0 Maternal care for breech presentation, not applicable or unspecified: Secondary | ICD-10-CM

## 2017-09-30 NOTE — Plan of Care (Signed)
Vs stable (q shift); FHT WNL (q 8 hours); bed rest with bathroom privileges; has tylenol and fioricet for pain/headache (pt aware that tylenol is IN fioricet); has meds for anxiety as needed

## 2017-09-30 NOTE — Progress Notes (Signed)
Patient ID: Adrienne West, female   DOB: 03/28/1991, 27 y.o.   MRN: [id number] [facility] about administering a second course of betamethasone. Dr. Dolphus JennySmall did not recommend a second course of Betamethasone before the planned delivery on 10/02/17.  Adrienne Idlerhristanna Ramonica Grigg MD [facility], [facility] 09/30/17 1:46 PM

## 2017-09-30 NOTE — Progress Notes (Signed)
Daily Antepartum Note  Admission Date: 09/23/2017 Current Date: 09/30/2017 10:30 AM  Trinna PostAlex Kathleen Limeawson Bourcier is a 27 y.o. G3P2002 @ 3138w6d, HD# 8, admitted for IUGR, oligohydramnios, antepartum monitoring and delivery.  Pregnancy complicated by: Patient Active Problem List   Diagnosis Date Noted  . Oligohydramnios 09/23/2017  . Indication for care in labor and delivery, antepartum 09/22/2017  . Mild Oligohydramnios antepartum 09/18/2017  . Breech presentation 09/18/2017  . Labor and delivery indication for care or intervention 09/17/2017  . Poor fetal growth affecting management of mother 08/28/2017  . Pregnancy, supervision, high-risk, third trimester 04/17/2017  . Bipolar disorder (HCC) 04/17/2017  . Tobacco smoking affecting pregnancy in first trimester 04/17/2017    Overnight/24hr events:  No acute events overnight.  Subjective:  Alert, cheerful, baby moving well. Denies contractions, vaginal bleeding, loss of fluid.  Objective:   Vitals:   09/30/17 0819 09/30/17 0945  BP: (!) 100/58 (!) 110/57  Pulse: 92 74  Resp: 18 16  Temp: 97.9 F (36.6 C) 98 F (36.7 C)  SpO2: 100%    Temp:  [97.8 F (36.6 C)-98.3 F (36.8 C)] 98 F (36.7 C) (03/26 0945) Pulse Rate:  [74-92] 74 (03/26 0945) Resp:  [16-20] 16 (03/26 0945) BP: (100-110)/(57-66) 110/57 (03/26 0945) SpO2:  [98 %-100 %] 100 % (03/26 0819) Temp (24hrs), Avg:98.1 F (36.7 C), Min:97.8 F (36.6 C), Max:98.3 F (36.8 C)   Intake/Output Summary (Last 24 hours) at 09/30/2017 1030 Last data filed at 09/30/2017 1019 Gross per 24 hour  Intake 290 ml  Output -  Net 290 ml     Current Vital Signs 24h Vital Sign Ranges  T 98 F (36.7 C) Temp  Avg: 98.1 F (36.7 C)  Min: 97.8 F (36.6 C)  Max: 98.3 F (36.8 C)  BP (!) 110/57 BP  Min: 100/58  Max: 110/57  HR 74 Pulse  Avg: 84.6  Min: 74  Max: 92  RR 16 Resp  Avg: 18  Min: 16  Max: 20  SaO2 100 % Room Air SpO2  Avg: 99 %  Min: 98 %  Max: 100 %       24 Hour I/O  Current Shift I/O  Time Ins Outs No intake/output data recorded. 03/26 0701 - 03/26 1900 In: 290 [P.O.:290] Out: -    Patient Vitals for the past 24 hrs:  BP Temp Temp src Pulse Resp SpO2  09/30/17 0945 (!) 110/57 98 F (36.7 C) Oral 74 16 -  09/30/17 0819 (!) 100/58 97.9 F (36.6 C) Oral 92 18 100 %  09/29/17 2100 109/66 97.8 F (36.6 C) Oral 87 18 99 %  09/29/17 1640 (!) 100/58 98.3 F (36.8 C) Oral 81 18 98 %  09/29/17 1102 104/60 98.3 F (36.8 C) Oral 89 20 -    Physical exam: General: no acute distress. Abdomen: gravid, non-tender Cardiovascular: regular rate  Respiratory: no increased work of breathing Extremities: no clubbing, cyanosis or edema Skin: Warm and dry.   NST: Baseline: 145 Variability: moderate Accelerations: present Decelerations: absent Tocometry: occasional irritability The patient was monitored for 20 minutes, fetal heart rate tracing was deemed reactive, category I tracing.   Medications: Current Facility-Administered Medications  Medication Dose Route Frequency Provider Last Rate Last Dose  . acetaminophen (TYLENOL) tablet 650 mg  650 mg Oral Q4H PRN Farrel ConnersGutierrez, Colleen, CNM   650 mg at 09/29/17 2150  . busPIRone (BUSPAR) tablet 7.5 mg  7.5 mg Oral BID PRN Nadara MustardHarris, Robert P, MD   7.5 mg  at 09/26/17 1301  . butalbital-acetaminophen-caffeine (FIORICET, ESGIC) 50-325-40 MG per tablet 2 tablet  2 tablet Oral Q6H PRN Farrel Conners, CNM   2 tablet at 09/29/17 1709  . famotidine (PEPCID) tablet 20 mg  20 mg Oral BID PRN Farrel Conners, CNM   20 mg at 09/29/17 2150  . hydrOXYzine (ATARAX/VISTARIL) tablet 25 mg  25 mg Oral Q6H PRN Farrel Conners, CNM   25 mg at 09/29/17 2340  . nicotine (NICODERM CQ - dosed in mg/24 hours) patch 14 mg  14 mg Transdermal Daily Farrel Conners, CNM   14 mg at 09/29/17 1122  . prenatal multivitamin tablet 1 tablet  1 tablet Oral Q1200 Farrel Conners, CNM   1 tablet at 09/29/17 1120  . simethicone  (MYLICON) chewable tablet 80 mg  80 mg Oral QID PRN Nadara Mustard, MD        Labs:  Admission on 09/22/2017, Discharged on 09/23/2017  Component Date Value  . WBC 09/22/2017 16.8*  . RBC 09/22/2017 3.31*  . Hemoglobin 09/22/2017 10.9*  . HCT 09/22/2017 31.3*  . MCV 09/22/2017 94.7   . [facility] 09/22/2017 32.8   . MCHC 09/22/2017 34.6   . RDW 09/22/2017 12.7   . Platelets 09/22/2017 264   . ABO/RH(D) 09/22/2017 O POS   . Antibody Screen 09/22/2017 NEG   . Sample Expiration 09/22/2017                     Value:09/25/2017 Performed at [facility], [address], [place], [place] [address]   . RPR Ser Ql 09/22/2017 Non Reactive   . Yeast Wet Prep HPF POC 09/22/2017 NONE SEEN   . Trich, Wet Prep 09/22/2017 NONE SEEN   . Clue Cells Wet Prep HPF * 09/22/2017 NONE SEEN   . WBC, Wet Prep HPF POC 09/22/2017 MANY*  . Sperm 09/22/2017 NONE SEEN   . Specimen source GC/Chlam 09/23/2017 URINE, RANDOM   . Chlamydia Tr 09/23/2017 NOT DETECTED   . N gonorrhoeae 09/23/2017 NOT DETECTED     Assessment & Plan:  27 y.o. [id number] @ [redacted]w[redacted]d, admitted for IUGR, oligohydramnios, antepartum monitoring and delivery. Reactive NST. Plan delivery via Cesarean at 34 weeks for oligohydramnios, IUGR, breech presentation. *Pregnancy: PNV, daily NST, q 8h FHT via Doppler *Preterm: Received two doses of betamethasone (3/12 and 09/17/2017) *PPx: Ambulation, TED hose *FEN/GI: Regular diet *Dispo: Continue inpatient until recovered from Cesarean delivery.  Marcelyn Bruins, CNM 09/30/2017  10:40 AM

## 2017-10-01 LAB — CBC
HEMATOCRIT: 28.5 % — AB (ref 35.0–47.0)
HEMOGLOBIN: 9.8 g/dL — AB (ref 12.0–16.0)
MCH: 32.5 pg (ref 26.0–34.0)
MCHC: 34.4 g/dL (ref 32.0–36.0)
MCV: 94.4 fL (ref 80.0–100.0)
Platelets: 218 10*3/uL (ref 150–440)
RBC: 3.03 MIL/uL — AB (ref 3.80–5.20)
RDW: 12.9 % (ref 11.5–14.5)
WBC: 13.4 10*3/uL — AB (ref 3.6–11.0)

## 2017-10-01 LAB — TYPE AND SCREEN
ABO/RH(D): O POS
ANTIBODY SCREEN: NEGATIVE

## 2017-10-01 MED ORDER — CEFTRIAXONE SODIUM 250 MG IJ SOLR: 250.0000 mg | Freq: Once | INTRAMUSCULAR | Status: DC

## 2017-10-01 MED ORDER — AZITHROMYCIN 250 MG PO TABS: 1000.0000 mg | ORAL_TABLET | Freq: Once | ORAL | Status: DC

## 2017-10-01 NOTE — Progress Notes (Signed)
On L&D for scheduled daily NST.

## 2017-10-01 NOTE — Progress Notes (Signed)
Daily Antepartum Note  Admission Date: 09/23/2017 Current Date: 10/01/2017 1:31 PM  Adrienne West is a 27 y.o. G3P2002 @ 547w0d, HD# 9, admitted for IUGR, oligohydramnios, antepartum monitoring and delivery.  Pregnancy complicated by: Patient Active Problem List   Diagnosis Date Noted  . Oligohydramnios 09/23/2017  . Indication for care in labor and delivery, antepartum 09/22/2017  . Mild Oligohydramnios antepartum 09/18/2017  . Breech presentation 09/18/2017  . Labor and delivery indication for care or intervention 09/17/2017  . Poor fetal growth affecting management of mother 08/28/2017  . Pregnancy, supervision, high-risk, third trimester 04/17/2017  . Bipolar disorder (HCC) 04/17/2017  . Tobacco smoking affecting pregnancy in first trimester 04/17/2017    Overnight/24hr events:  No acute events overnight.  Subjective:  Alert, patient is feeling well and says that symptoms she thought might be a cold are now gone. Baby is moving well. She denies contractions, vaginal bleeding, loss of fluid.  Objective:   Vitals:   10/01/17 0334 10/01/17 1145  BP: (!) 88/59 108/68  Pulse: 69 91  Resp: 16 17  Temp: 98.1 F (36.7 C) 98 F (36.7 C)  SpO2:     Temp:  [97.8 F (36.6 C)-98.1 F (36.7 C)] 98 F (36.7 C) (03/27 1145) Pulse Rate:  [69-91] 91 (03/27 1145) Resp:  [16-18] 17 (03/27 1145) BP: (88-117)/(59-72) 108/68 (03/27 1145) SpO2:  [98 %] 98 % (03/26 1946) Temp (24hrs), Avg:98 F (36.7 C), Min:97.8 F (36.6 C), Max:98.1 F (36.7 C)   Intake/Output Summary (Last 24 hours) at 10/01/2017 1331 Last data filed at 09/30/2017 1906 Gross per 24 hour  Intake 540 ml  Output -  Net 540 ml     Current Vital Signs 24h Vital Sign Ranges  T 98 F (36.7 C) Temp  Avg: 98 F (36.7 C)  Min: 97.8 F (36.6 C)  Max: 98.1 F (36.7 C)  BP 108/68 BP  Min: 88/59  Max: 117/71  HR 91 Pulse  Avg: 83.3  Min: 69  Max: 91  RR 17 Resp  Avg: 17.3  Min: 16  Max: 18  SaO2 98 % Room Air  SpO2  Avg: 98 %  Min: 98 %  Max: 98 %       24 Hour I/O Current Shift I/O  Time Ins Outs 03/26 0701 - 03/27 0700 In: 830 [P.O.:830] Out: -  No intake/output data recorded.   Patient Vitals for the past 24 hrs:  BP Temp Temp src Pulse Resp SpO2  10/01/17 1145 108/68 98 F (36.7 C) Oral 91 17 -  10/01/17 0334 (!) 88/59 98.1 F (36.7 C) Oral 69 16 -  09/30/17 1946 117/71 97.9 F (36.6 C) Oral 87 18 98 %  09/30/17 1557 108/72 97.8 F (36.6 C) Oral 86 18 98 %    Physical exam: General: no acute distress. Abdomen: gravid, non-tender Cardiovascular: regular rate  Respiratory: no increased work of breathing Extremities: no clubbing, cyanosis or edema Skin: Warm and dry.   NST: Baseline: 140 Variability: moderate Accelerations: present Decelerations: absent Tocometry: no contractions The patient was monitored for 20 minutes, fetal heart rate tracing was deemed reactive, category I tracing.   Medications: Current Facility-Administered Medications  Medication Dose Route Frequency Provider Last Rate Last Dose  . acetaminophen (TYLENOL) tablet 650 mg  650 mg Oral Q4H PRN Farrel ConnersGutierrez, Colleen, CNM   650 mg at 09/29/17 2150  . busPIRone (BUSPAR) tablet 7.5 mg  7.5 mg Oral BID PRN Nadara MustardHarris, Robert P, MD   7.5 mg  at 09/26/17 1301  . butalbital-acetaminophen-caffeine (FIORICET, ESGIC) 50-325-40 MG per tablet 2 tablet  2 tablet Oral Q6H PRN Farrel Conners, CNM   2 tablet at 09/29/17 1709  . famotidine (PEPCID) tablet 20 mg  20 mg Oral BID PRN Farrel Conners, CNM   20 mg at 09/29/17 2150  . hydrOXYzine (ATARAX/VISTARIL) tablet 25 mg  25 mg Oral Q6H PRN Farrel Conners, CNM   25 mg at 09/30/17 2154  . nicotine (NICODERM CQ - dosed in mg/24 hours) patch 14 mg  14 mg Transdermal Daily Farrel Conners, CNM   14 mg at 10/01/17 4034  . prenatal multivitamin tablet 1 tablet  1 tablet Oral Q1200 Farrel Conners, CNM   1 tablet at 10/01/17 [phone]  . simethicone (MYLICON) chewable tablet  80 mg  80 mg Oral QID PRN Nadara Mustard, MD        Labs:  Admission on 09/23/2017  Component Date Value  . WBC 10/01/2017 13.4*  . RBC 10/01/2017 3.03*  . Hemoglobin 10/01/2017 9.8*  . HCT 10/01/2017 28.5*  . MCV 10/01/2017 94.4   . [facility] 10/01/2017 32.5   . MCHC 10/01/2017 34.4   . RDW 10/01/2017 12.9   . Platelets 10/01/2017 218   . ABO/RH(D) 10/01/2017 O POS   . Antibody Screen 10/01/2017 NEG   . Sample Expiration 10/01/2017                     Value:10/04/2017 Performed at [facility], [address], [place], [place] [address]     Assessment & Plan:  27 y.o. [phone] @ [redacted]w[redacted]d, admitted for IUGR, oligohydramnios, antepartum monitoring and delivery. Reactive NST. Plan delivery via Cesarean at 34 weeks for oligohydramnios, IUGR, breech presentation. *Pregnancy: PNV, daily NST, q 8h FHT via Doppler *Preterm: Received two doses of betamethasone (3/12 and 09/17/2017) *PPx: Ambulation *FEN/GI: Regular diet *Dispo: Continue inpatient until recovered from Cesarean delivery.   Tresea Mall, CNM 10/01/2017  1:31 PM

## 2017-10-01 NOTE — Progress Notes (Signed)
Pre op teaching completed for C/S in am.  Pt verb u/o of the process and expectations.  Pt has spoken to Neo/SCN nurse to understand about "Alice".  Pt is happy and excited about tomorrow.

## 2017-10-02 ENCOUNTER — Encounter: Payer: Self-pay | Admitting: Certified Registered"

## 2017-10-02 ENCOUNTER — Inpatient Hospital Stay: Payer: Medicaid Other | Admitting: Anesthesiology

## 2017-10-02 ENCOUNTER — Encounter
Admission: AD | Disposition: A | Discharge status: Home / Self Care | Payer: Self-pay | Source: Ambulatory Visit | Attending: Certified Nurse Midwife

## 2017-10-02 ENCOUNTER — Inpatient Hospital Stay: Admit: 2017-10-02 | Payer: Self-pay | Admitting: Obstetrics and Gynecology

## 2017-10-02 ENCOUNTER — Ambulatory Visit: Payer: Medicaid Other

## 2017-10-02 DIAGNOSIS — O4103X0 Oligohydramnios, third trimester, not applicable or unspecified: Secondary | ICD-10-CM

## 2017-10-02 DIAGNOSIS — Z3A32 32 weeks gestation of pregnancy: Secondary | ICD-10-CM

## 2017-10-02 LAB — CBC
HEMATOCRIT: 28.8 % — AB (ref 35.0–47.0)
Hemoglobin: 9.7 g/dL — ABNORMAL LOW (ref 12.0–16.0)
MCH: 32.1 pg (ref 26.0–34.0)
MCHC: 33.6 g/dL (ref 32.0–36.0)
MCV: 95.5 fL (ref 80.0–100.0)
Platelets: 216 10*3/uL (ref 150–440)
RBC: 3.02 MIL/uL — ABNORMAL LOW (ref 3.80–5.20)
RDW: 12.9 % (ref 11.5–14.5)
WBC: 15 10*3/uL — ABNORMAL HIGH (ref 3.6–11.0)

## 2017-10-02 LAB — RAPID HIV SCREEN (HIV 1/2 AB+AG)
HIV 1/2 Antibodies: NONREACTIVE
HIV-1 P24 Antigen - HIV24: NONREACTIVE

## 2017-10-02 SURGERY — Surgical Case
Anesthesia: Spinal

## 2017-10-02 MED ORDER — NALOXONE HCL 4 MG/10ML IJ SOLN
1.0000 ug/kg/h | INTRAVENOUS | Status: DC | PRN
  Filled 2017-10-02: qty 5

## 2017-10-02 MED ORDER — LACTATED RINGERS IV BOLUS
1000.0000 mL | Freq: Once | INTRAVENOUS | Status: AC
  Administered 2017-10-02: 1000 mL via INTRAVENOUS

## 2017-10-02 MED ORDER — PHENYLEPHRINE HCL 10 MG/ML IJ SOLN
INTRAMUSCULAR | Status: DC | PRN
  Administered 2017-10-02: 50 ug/min via INTRAVENOUS

## 2017-10-02 MED ORDER — DIPHENHYDRAMINE HCL 50 MG/ML IJ SOLN
INTRAMUSCULAR | Status: AC
  Administered 2017-10-02: 12.5 mg via INTRAVENOUS
  Filled 2017-10-02: qty 1

## 2017-10-02 MED ORDER — ACETAMINOPHEN 10 MG/ML IV SOLN
INTRAVENOUS | Status: DC | PRN
  Administered 2017-10-02: 1000 mg via INTRAVENOUS

## 2017-10-02 MED ORDER — BUPIVACAINE 0.25 % ON-Q PUMP DUAL CATH 400 ML
400.0000 mL | INJECTION | Status: DC
  Filled 2017-10-02: qty 400

## 2017-10-02 MED ORDER — DIPHENHYDRAMINE HCL 50 MG/ML IJ SOLN
12.5000 mg | Freq: Once | INTRAMUSCULAR | Status: AC
  Administered 2017-10-02: 12.5 mg via INTRAVENOUS

## 2017-10-02 MED ORDER — SIMETHICONE 80 MG PO CHEW
80.0000 mg | CHEWABLE_TABLET | Freq: Three times a day (TID) | ORAL | Status: DC
  Administered 2017-10-02 – 2017-10-04 (×7): 80 mg via ORAL
  Filled 2017-10-02 (×7): qty 1

## 2017-10-02 MED ORDER — HYDROXYZINE HCL 25 MG PO TABS
25.0000 mg | ORAL_TABLET | Freq: Four times a day (QID) | ORAL | Status: DC | PRN
  Administered 2017-10-02: 25 mg via ORAL
  Filled 2017-10-02: qty 1

## 2017-10-02 MED ORDER — KETOROLAC TROMETHAMINE 30 MG/ML IJ SOLN
INTRAMUSCULAR | Status: DC | PRN
  Administered 2017-10-02: 30 mg via INTRAVENOUS

## 2017-10-02 MED ORDER — PRENATAL MULTIVITAMIN CH
1.0000 | ORAL_TABLET | Freq: Every day | ORAL | Status: DC
  Administered 2017-10-02 – 2017-10-04 (×3): 1 via ORAL
  Filled 2017-10-02 (×3): qty 1

## 2017-10-02 MED ORDER — DEXAMETHASONE SODIUM PHOSPHATE 10 MG/ML IJ SOLN
INTRAMUSCULAR | Status: AC
  Filled 2017-10-02: qty 1

## 2017-10-02 MED ORDER — LACTATED RINGERS IV SOLN
INTRAVENOUS | Status: DC
  Administered 2017-10-02: 07:00:00 via INTRAVENOUS

## 2017-10-02 MED ORDER — BUPIVACAINE IN DEXTROSE 0.75-8.25 % IT SOLN
INTRATHECAL | Status: DC | PRN
  Administered 2017-10-02: 1.6 mL via INTRATHECAL

## 2017-10-02 MED ORDER — OXYCODONE-ACETAMINOPHEN 5-325 MG PO TABS: 2.0000 | ORAL_TABLET | ORAL | Status: DC | PRN

## 2017-10-02 MED ORDER — ONDANSETRON HCL 4 MG/2ML IJ SOLN
INTRAMUSCULAR | Status: AC
  Filled 2017-10-02: qty 2

## 2017-10-02 MED ORDER — DIBUCAINE 1 % RE OINT
1.0000 | TOPICAL_OINTMENT | RECTAL | Status: DC | PRN
Start: 2017-10-02 — End: 2017-10-04

## 2017-10-02 MED ORDER — SODIUM CHLORIDE 0.9% FLUSH: 3.0000 mL | INTRAVENOUS | Status: DC | PRN

## 2017-10-02 MED ORDER — PROPOFOL 10 MG/ML IV BOLUS
INTRAVENOUS | Status: AC
  Filled 2017-10-02: qty 20

## 2017-10-02 MED ORDER — ACETAMINOPHEN 10 MG/ML IV SOLN
INTRAVENOUS | Status: AC
  Filled 2017-10-02: qty 100

## 2017-10-02 MED ORDER — ONDANSETRON HCL 4 MG/2ML IJ SOLN
INTRAMUSCULAR | Status: DC | PRN
  Administered 2017-10-02: 4 mg via INTRAVENOUS

## 2017-10-02 MED ORDER — CEFAZOLIN SODIUM-DEXTROSE 2-4 GM/100ML-% IV SOLN
2.0000 g | INTRAVENOUS | Status: AC
  Administered 2017-10-02: 2 g via INTRAVENOUS
  Filled 2017-10-02: qty 100

## 2017-10-02 MED ORDER — BUPIVACAINE HCL (PF) 0.5 % IJ SOLN: 5.0000 mL | Freq: Once | INTRAMUSCULAR | Status: DC

## 2017-10-02 MED ORDER — SENNOSIDES-DOCUSATE SODIUM 8.6-50 MG PO TABS
2.0000 | ORAL_TABLET | ORAL | Status: DC
  Administered 2017-10-03 – 2017-10-04 (×2): 2 via ORAL
  Filled 2017-10-02 (×2): qty 2

## 2017-10-02 MED ORDER — EPHEDRINE SULFATE 50 MG/ML IJ SOLN
INTRAMUSCULAR | Status: DC | PRN
  Administered 2017-10-02 (×5): 10 mg via INTRAVENOUS

## 2017-10-02 MED ORDER — NALBUPHINE HCL 10 MG/ML IJ SOLN: 5.0000 mg | Freq: Once | INTRAMUSCULAR | Status: AC | PRN

## 2017-10-02 MED ORDER — MORPHINE SULFATE (PF) 0.5 MG/ML IJ SOLN
INTRAMUSCULAR | Status: AC
  Filled 2017-10-02: qty 10

## 2017-10-02 MED ORDER — SOD CITRATE-CITRIC ACID 500-334 MG/5ML PO SOLN
ORAL | Status: AC
  Administered 2017-10-02: 30 mL
  Filled 2017-10-02: qty 15

## 2017-10-02 MED ORDER — FERROUS SULFATE 325 (65 FE) MG PO TABS
325.0000 mg | ORAL_TABLET | Freq: Two times a day (BID) | ORAL | Status: DC
  Administered 2017-10-02 – 2017-10-04 (×4): 325 mg via ORAL
  Filled 2017-10-02 (×4): qty 1

## 2017-10-02 MED ORDER — BUPIVACAINE HCL 0.5 % IJ SOLN
INTRAMUSCULAR | Status: DC | PRN
  Administered 2017-10-02: 10 mL

## 2017-10-02 MED ORDER — NALBUPHINE HCL 10 MG/ML IJ SOLN: 5.0000 mg | INTRAMUSCULAR | Status: DC | PRN

## 2017-10-02 MED ORDER — ONDANSETRON HCL 4 MG/2ML IJ SOLN: 4.0000 mg | Freq: Once | INTRAMUSCULAR | Status: DC | PRN

## 2017-10-02 MED ORDER — BUPIVACAINE HCL (PF) 0.5 % IJ SOLN
INTRAMUSCULAR | Status: AC
  Filled 2017-10-02: qty 30

## 2017-10-02 MED ORDER — BUSPIRONE HCL 15 MG PO TABS
7.5000 mg | ORAL_TABLET | Freq: Two times a day (BID) | ORAL | Status: DC | PRN
  Filled 2017-10-02: qty 1

## 2017-10-02 MED ORDER — MEPERIDINE HCL 25 MG/ML IJ SOLN: 6.2500 mg | INTRAMUSCULAR | Status: DC | PRN

## 2017-10-02 MED ORDER — DIPHENHYDRAMINE HCL 25 MG PO CAPS: 25.0000 mg | ORAL_CAPSULE | Freq: Four times a day (QID) | ORAL | Status: DC | PRN

## 2017-10-02 MED ORDER — COCONUT OIL OIL: 1.0000 "application " | TOPICAL_OIL | Status: DC | PRN

## 2017-10-02 MED ORDER — MENTHOL 3 MG MT LOZG
1.0000 | LOZENGE | OROMUCOSAL | Status: DC | PRN
  Filled 2017-10-02: qty 9

## 2017-10-02 MED ORDER — LACTATED RINGERS IV SOLN: INTRAVENOUS | Status: DC

## 2017-10-02 MED ORDER — NICOTINE 14 MG/24HR TD PT24
14.0000 mg | MEDICATED_PATCH | Freq: Every day | TRANSDERMAL | Status: DC
Start: 2017-10-02 — End: 2017-10-04
  Administered 2017-10-02: 14 mg via TRANSDERMAL
  Filled 2017-10-02 (×2): qty 1

## 2017-10-02 MED ORDER — ONDANSETRON HCL 4 MG/2ML IJ SOLN: 4.0000 mg | Freq: Three times a day (TID) | INTRAMUSCULAR | Status: DC | PRN

## 2017-10-02 MED ORDER — OXYTOCIN 40 UNITS IN LACTATED RINGERS INFUSION - SIMPLE MED
INTRAVENOUS | Status: AC
  Filled 2017-10-02: qty 1000

## 2017-10-02 MED ORDER — WITCH HAZEL-GLYCERIN EX PADS: 1.0000 "application " | MEDICATED_PAD | CUTANEOUS | Status: DC | PRN

## 2017-10-02 MED ORDER — OXYCODONE-ACETAMINOPHEN 5-325 MG PO TABS: 1.0000 | ORAL_TABLET | ORAL | Status: DC | PRN

## 2017-10-02 MED ORDER — OXYCODONE HCL 5 MG PO TABS
5.0000 mg | ORAL_TABLET | ORAL | Status: DC | PRN
  Administered 2017-10-03 – 2017-10-04 (×4): 5 mg via ORAL
  Filled 2017-10-02 (×4): qty 1

## 2017-10-02 MED ORDER — OXYTOCIN 40 UNITS IN LACTATED RINGERS INFUSION - SIMPLE MED
INTRAVENOUS | Status: DC | PRN
  Administered 2017-10-02: 40 mL via INTRAVENOUS

## 2017-10-02 MED ORDER — MORPHINE SULFATE (PF) 0.5 MG/ML IJ SOLN
INTRAMUSCULAR | Status: DC | PRN
  Administered 2017-10-02: .15 mg via EPIDURAL

## 2017-10-02 MED ORDER — KETOROLAC TROMETHAMINE 30 MG/ML IJ SOLN
INTRAMUSCULAR | Status: AC
  Filled 2017-10-02: qty 1

## 2017-10-02 MED ORDER — OXYTOCIN 40 UNITS IN LACTATED RINGERS INFUSION - SIMPLE MED
2.5000 [IU]/h | INTRAVENOUS | Status: DC
  Administered 2017-10-02: 2.5 [IU]/h via INTRAVENOUS

## 2017-10-02 MED ORDER — SEVOFLURANE IN SOLN
RESPIRATORY_TRACT | Status: AC
  Filled 2017-10-02: qty 250

## 2017-10-02 MED ORDER — IBUPROFEN 600 MG PO TABS
600.0000 mg | ORAL_TABLET | Freq: Four times a day (QID) | ORAL | Status: DC
  Administered 2017-10-03 – 2017-10-04 (×5): 600 mg via ORAL
  Filled 2017-10-02 (×5): qty 1

## 2017-10-02 MED ORDER — KETOROLAC TROMETHAMINE 30 MG/ML IJ SOLN
30.0000 mg | Freq: Four times a day (QID) | INTRAMUSCULAR | Status: AC | PRN
  Administered 2017-10-02 – 2017-10-03 (×3): 30 mg via INTRAVENOUS
  Filled 2017-10-02 (×3): qty 1

## 2017-10-02 MED ORDER — KETOROLAC TROMETHAMINE 30 MG/ML IJ SOLN: 30.0000 mg | Freq: Four times a day (QID) | INTRAMUSCULAR | Status: AC | PRN

## 2017-10-02 MED ORDER — EPHEDRINE SULFATE 50 MG/ML IJ SOLN
INTRAMUSCULAR | Status: AC
  Filled 2017-10-02: qty 1

## 2017-10-02 MED ORDER — NALBUPHINE HCL 10 MG/ML IJ SOLN
5.0000 mg | Freq: Once | INTRAMUSCULAR | Status: AC | PRN
  Administered 2017-10-02: 5 mg via INTRAVENOUS
  Filled 2017-10-02: qty 1

## 2017-10-02 MED ORDER — SOD CITRATE-CITRIC ACID 500-334 MG/5ML PO SOLN: 30.0000 mL | ORAL | Status: DC

## 2017-10-02 MED ORDER — OXYTOCIN 10 UNIT/ML IJ SOLN
INTRAMUSCULAR | Status: AC
  Filled 2017-10-02: qty 2

## 2017-10-02 MED ORDER — NALOXONE HCL 0.4 MG/ML IJ SOLN: 0.4000 mg | INTRAMUSCULAR | Status: DC | PRN

## 2017-10-02 MED ORDER — DEXAMETHASONE SODIUM PHOSPHATE 10 MG/ML IJ SOLN
INTRAMUSCULAR | Status: DC | PRN
  Administered 2017-10-02: 10 mg via INTRAVENOUS

## 2017-10-02 MED ORDER — FENTANYL CITRATE (PF) 100 MCG/2ML IJ SOLN: 25.0000 ug | INTRAMUSCULAR | Status: DC | PRN

## 2017-10-02 MED ORDER — NALBUPHINE HCL 10 MG/ML IJ SOLN
5.0000 mg | INTRAMUSCULAR | Status: DC | PRN
  Administered 2017-10-02: 5 mg via INTRAVENOUS
  Filled 2017-10-02: qty 1

## 2017-10-02 SURGICAL SUPPLY — 33 items
CANISTER SUCT 3000ML PPV (MISCELLANEOUS) ×3 IMPLANT
CATH KIT ON-Q SILVERSOAK 5IN (CATHETERS) ×6 IMPLANT
CLOSURE WOUND 1/2 X4 (GAUZE/BANDAGES/DRESSINGS)
DERMABOND ADVANCED (GAUZE/BANDAGES/DRESSINGS) ×2
DERMABOND ADVANCED .7 DNX12 (GAUZE/BANDAGES/DRESSINGS) ×1 IMPLANT
DRSG OPSITE POSTOP 4X10 (GAUZE/BANDAGES/DRESSINGS) ×3 IMPLANT
DRSG TELFA 3X8 NADH (GAUZE/BANDAGES/DRESSINGS) ×3 IMPLANT
ELECT CAUTERY BLADE 6.4 (BLADE) IMPLANT
ELECT REM PT RETURN 9FT ADLT (ELECTROSURGICAL) ×3
ELECTRODE REM PT RTRN 9FT ADLT (ELECTROSURGICAL) ×1 IMPLANT
GAUZE SPONGE 4X4 12PLY STRL (GAUZE/BANDAGES/DRESSINGS) ×3 IMPLANT
GLOVE BIO SURGEON STRL SZ7 (GLOVE) ×12 IMPLANT
GLOVE INDICATOR 7.5 STRL GRN (GLOVE) ×12 IMPLANT
GOWN STRL REUS W/ TWL LRG LVL3 (GOWN DISPOSABLE) ×3 IMPLANT
GOWN STRL REUS W/TWL LRG LVL3 (GOWN DISPOSABLE) ×6
NS IRRIG 1000ML POUR BTL (IV SOLUTION) ×3 IMPLANT
PACK C SECTION AR (MISCELLANEOUS) ×3 IMPLANT
PAD OB MATERNITY 4.3X12.25 (PERSONAL CARE ITEMS) ×6 IMPLANT
PAD PREP 24X41 OB/GYN DISP (PERSONAL CARE ITEMS) ×3 IMPLANT
SPONGE LAP 18X18 5 PK (GAUZE/BANDAGES/DRESSINGS) IMPLANT
STRIP CLOSURE SKIN 1/2X4 (GAUZE/BANDAGES/DRESSINGS) IMPLANT
SUT CHROMIC GUT BROWN 0 54 (SUTURE) IMPLANT
SUT CHROMIC GUT BROWN 0 54IN (SUTURE)
SUT MNCRL 4-0 (SUTURE) ×2
SUT MNCRL 4-0 27XMFL (SUTURE) ×1
SUT PDS AB 1 TP1 96 (SUTURE) ×3 IMPLANT
SUT PLAIN GUT 0 (SUTURE) IMPLANT
SUT VIC AB 0 CTX 36 (SUTURE) ×4
SUT VIC AB 0 CTX36XBRD ANBCTRL (SUTURE) ×2 IMPLANT
SUT VIC AB 3-0 SH 27 (SUTURE) ×2
SUT VIC AB 3-0 SH 27X BRD (SUTURE) ×1 IMPLANT
SUTURE MNCRL 4-0 27XMF (SUTURE) ×1 IMPLANT
SWABSTK COMLB BENZOIN TINCTURE (MISCELLANEOUS) ×3 IMPLANT

## 2017-10-02 NOTE — Anesthesia Post-op Follow-up Note (Signed)
Anesthesia QCDR form completed.        

## 2017-10-02 NOTE — Progress Notes (Signed)
[id number] - pt ambulatory into OBS room from M/B unit with s/o and family present. Confirms plan for c/s d/t breech presentation, oligo, and iugr at 34.1 wks. Confirms removal of jewelry, shower this am with CHG wipes and has not eaten or drank anything since prior to midnight. Pt confirms +FM and no signs of preterm labor.

## 2017-10-02 NOTE — H&P (Signed)
History and Physical Interval Note:  Adrienne West  has presented today for surgery, with the diagnosis of Fetal growth restriction, oligohydramnios, breech presentation.  The various methods of treatment have been discussed with the patient and family. After consideration of risks, benefits and other options for treatment, the patient has consented to  Procedure(s): CESAREAN SECTION (N/A) as a surgical intervention .  The patient's history has been reviewed, patient examined, no change in status, stable for surgery.  I have reviewed the patient's chart and labs.  Questions were answered to the patient's satisfaction.     Thomasene MohairStephen Lizette Pazos, MD 10/02/2017 7:20 AM

## 2017-10-02 NOTE — Transfer of Care (Signed)
Immediate Anesthesia Transfer of Care Note  Patient: Adrienne West  Procedure(s) Performed: CESAREAN SECTION (N/A )  Patient Location: PACU  Anesthesia Type:Spinal  Level of Consciousness: awake, alert , oriented and patient cooperative  Airway & Oxygen Therapy: Patient Spontanous Breathing  Post-op Assessment: Report given to RN and Post -op Vital signs reviewed and stable  Post vital signs: Reviewed and stable  Last Vitals:  Vitals Value Taken Time  BP 118/65 10/02/2017  9:02 AM  Temp 36.5 C 10/02/2017  9:02 AM  Pulse 55 10/02/2017  9:02 AM  Resp 16 10/02/2017  9:02 AM  SpO2 100 % 10/02/2017  9:02 AM    Last Pain:  Vitals:   10/02/17 0902  TempSrc: Oral  PainSc: 0-No pain      Patients Stated Pain Goal: 0 (09/25/17 0826)  Complications: No apparent anesthesia complications

## 2017-10-02 NOTE — Progress Notes (Signed)
Patient complains of itching notified MD and received order for benadryl.

## 2017-10-02 NOTE — Op Note (Signed)
Cesarean Section Operative Note    Adrienne West   10/02/2017   Pre-operative Diagnosis:  1) intrauterine pregnancy at [redacted]w[redacted]d 2) fetal growth restriction 3) oligohydramnios 4) breech presentation  Post-operative Diagnosis:  1) intrauterine pregnancy at [redacted]w[redacted]d 2) fetal growth restriction 3) oligohydramnios 4) breech presentation  Procedure: Primary low transverse cesarean section via pfannenstiel incision without double layer uterine closure  Surgeon: Surgeon(s) and Role:    Conard Novak, MD - Primary   Assistants: Annamarie Major, MD  Anesthesia: spinal   Findings:  1) normal appearing gravid uterus, fallopian tubes, and ovaries 2) viable female infant with APGARs 6 and 9 with weight 1,900 grams    Estimated Blood Loss: 550 ml  Total IV Fluids: 1,000 ml crystalloid  Urine Output: 450 mL clear urine at end of procedure  Specimens: placenta for permanent  Complications: no complications  Disposition: PACU - hemodynamically stable.   Maternal Condition: stable   Baby condition / location:  NICU  Procedure Details:  The patient was seen in the Holding Room. The risks, benefits, complications, treatment options, and expected outcomes were discussed with the patient. The patient concurred with the proposed plan, giving informed consent. identified as Adrienne West and the procedure verified as C-Section Delivery. A Time Out was held and the above information confirmed.   After induction of anesthesia, the patient was draped and prepped in the usual sterile manner. A Pfannenstiel incision was made and carried down through the subcutaneous tissue to the fascia. Fascial incision was made and extended transversely. The fascia was separated from the underlying rectus tissue superiorly and inferiorly. The peritoneum was identified and entered. Peritoneal incision was extended longitudinally. The bladder flap was not freed from the lower uterine segment. A low transverse  uterine incision was made and the hysterotomy was extended with cranial-caudal tension. Delivered from breech presentation in the usual fashion using a wet blue towel to aid with gentle traction was a 1,900 gram Living newborn infant(s) or Female with Apgar scores of 6 at one minute and 9 at five minutes. Cord ph was not sent the umbilical cord was clamped and cut cord blood was obtained for evaluation. The placenta was removed Intact and appeared normal. The uterine outline, tubes and ovaries appeared normal. The uterine incision was closed with running locked sutures of 0 Vicryl.  A second layer of the same suture was thrown in an imbricating fashion.  Hemostasis was assured.  The uterus was returned to the abdomen and the paracolic gutters were cleared of all clots and debris.  The rectus muscles were inspected and found to be hemostatic.  The On-Q catheter pumps were inserted in accordance with the manufacturer's recommendations.  The catheters were inserted approximately 4cm cephelad to the incision line, approximately 1cm apart, straddling the midline.  They were inserted to a depth of the 4th mark. They were positioned superficial to the rectus abdominus muscles and deep to the rectus fascia.    The fascia was then reapproximated with running sutures of 1-0 PDS, looped.  Three 3-0 vicryl interrupted stitches were thrown in the subcutaneous layer just deep to the dermis to reduce tension on the skin closure.  The subcuticular closure was performed using 4-0 monocryl. The skin closure was reinforced using surgical skin glue.  The On-Q catheters were bolused with 5 mL of 0.5% marcaine plain for a total of 10 mL.  The catheters were affixed to the skin with surgical skin glue, steri-strips, and tegaderm.  Instrument, sponge, and needle counts were correct prior the abdominal closure and were correct at the conclusion of the case.  The patient received Ancef 2 gram IV prior to skin incision (within 30  minutes). For VTE prophylaxis she was wearing SCDs throughout the case.  The assistant surgeon was an MD due to lack of availability of another Sales promotion account executivequalified assistant.   Signed: Conard NovakStephen D. Tyrion Glaude, MD 10/02/2017 9:16 AM

## 2017-10-02 NOTE — Progress Notes (Signed)
Patient ID: Adrienne West, female   DOB: 07-10-1990, 27 y.o.   MRN: [id number] Daily Antepartum Note  Admission Date: 09/23/2017 Current Date: 10/02/2017 7:38 AM  Trinna Post Annalina Needles is a [name] @ [redacted]w[redacted]d, HD#12, admitted for fetal growth restriction and oligohydramnios.  Pregnancy complicated by: Patient Active Problem List   Diagnosis Date Noted  . Oligohydramnios 09/23/2017  . Indication for care in labor and delivery, antepartum 09/22/2017  . Mild Oligohydramnios antepartum 09/18/2017  . Breech presentation 09/18/2017  . Labor and delivery indication for care or intervention 09/17/2017  . Poor fetal growth affecting management of mother 08/28/2017  . Pregnancy, supervision, high-risk, third trimester 04/17/2017  . Bipolar disorder (HCC) 04/17/2017  . Tobacco smoking affecting pregnancy in first trimester 04/17/2017    Overnight/24hr events:  No acute events  Subjective:  Notes +FM, no LOF, no vaginal bleeding, no contractions  Objective:   Vitals:   10/02/17 0616 10/02/17 0714  BP: (!) 85/44 (!) 99/59  Pulse: 77 61  Resp:  14  Temp:  97.7 F (36.5 C)  SpO2:     Temp:  [97.7 F (36.5 C)-98.2 F (36.8 C)] 97.7 F (36.5 C) (03/28 0714) Pulse Rate:  [61-91] 61 (03/28 0714) Resp:  [14-18] 14 (03/28 0714) BP: (85-108)/(44-68) 99/59 (03/28 0714) SpO2:  [98 %-99 %] 98 % (03/27 2100) Temp (24hrs), Avg:98 F (36.7 C), Min:97.7 F (36.5 C), Max:98.2 F (36.8 C)  No intake or output data in the 24 hours ending 10/02/17 0738   Current Vital Signs 24h Vital Sign Ranges  T 97.7 F (36.5 C) Temp  Avg: 98 F (36.7 C)  Min: 97.7 F (36.5 C)  Max: 98.2 F (36.8 C)  BP (!) 99/59 BP  Min: 85/44  Max: 108/68  HR 61 Pulse  Avg: 77.2  Min: 61  Max: 91  RR 14 Resp  Avg: 16.8  Min: 14  Max: 18  SaO2 98 % Room Air SpO2  Avg: 98.5 %  Min: 98 %  Max: 99 %       24 Hour I/O Current Shift I/O  Time Ins Outs No intake/output data recorded. No intake/output data recorded.    Patient Vitals for the past 24 hrs:  BP Temp Temp src Pulse Resp SpO2  10/02/17 0714 (!) 99/59 97.7 F (36.5 C) Oral 61 14 -  10/02/17 0616 (!) 85/44 - - 77 - -  10/01/17 2100 107/62 98.2 F (36.8 C) Oral 84 18 98 %  10/01/17 1504 102/63 98 F (36.7 C) Oral 73 18 99 %  10/01/17 1145 108/68 98 F (36.7 C) Oral 91 17 -    Physical exam: General: Well nourished, well developed female in no acute distress. CV: RRR Pulm: CTAB Abdomen: gravid, NT Extremities: no clubbing, cyanosis or edema Skin: Warm and dry.   Medications: Current Facility-Administered Medications  Medication Dose Route Frequency Provider Last Rate Last Dose  . bupivacaine (MARCAINE) 0.5 % injection 5 mL  5 mL Infiltration Once Conard Novak, MD      . bupivacaine (MARCAINE) 0.5 % injection 5 mL  5 mL Infiltration Once Conard Novak, MD      . bupivacaine (MARCAINE) 0.5 % injection           . bupivacaine 0.25 % ON-Q pump DUAL CATH 400 mL  400 mL Other Continuous Conard Novak, MD      . ceFAZolin (ANCEF) IVPB 2g/100 mL premix  2 g Intravenous 30 min Pre-Op Jean Rosenthal,  Mila HomerStephen D, MD 200 mL/hr at 10/02/17 0730 2 g at 10/02/17 0730  . lactated ringers infusion   Intravenous Continuous Conard NovakJackson, Malissia Rabbani D, MD 125 mL/hr at 10/02/17 [id number]    . oxytocin 40 units in LR 1000 mL 40 units/1000 mL infusion           . sodium citrate-citric acid (ORACIT) 500-334 MG/5ML solution           . sodium citrate-citric acid (ORACIT) solution 30 mL  30 mL Oral 30 min Pre-Op Conard NovakJackson, Jemmie Ledgerwood D, MD        Labs:  Recent Labs  Lab 10/01/17 0305 10/02/17 0703  WBC 13.4* 15.0*  HGB 9.8* 9.7*  HCT 28.5* 28.8*  PLT 218 216    Radiology: bedside ultrasound performed by me: Fetus is breech, amniotic fluid is subjectively low  Assessment & Plan:  27 y.o. [id number] female at 1142w1d admitted for inpatient monitoring and delivery at 34 weeks for fetal growth restriction, oligohydramnios. She has worsening uterine artery doppler  measurements - to OR today for primary low transverse cesarean section - consent reviewed by me with the risks/benefits of surgery discussed  Thomasene MohairStephen Lucas Winograd, MD, Merlinda FrederickFACOG [facility], [facility] 10/02/2017 7:41 AM

## 2017-10-02 NOTE — Anesthesia Procedure Notes (Signed)
Spinal  Start time: 10/02/2017 7:52 AM End time: 10/02/2017 7:57 AM Staffing Anesthesiologist: Lenard SimmerKarenz, Andrew, MD Resident/CRNA: Sherol DadeMacMang, Nyhla Mountjoy H, CRNA Performed: resident/CRNA  Preanesthetic Checklist Completed: patient identified, site marked, surgical consent, pre-op evaluation, timeout performed, IV checked, risks and benefits discussed and monitors and equipment checked Spinal Block Patient position: sitting Prep: ChloraPrep Patient monitoring: heart rate, cardiac monitor, continuous pulse ox and blood pressure Approach: midline Location: L3-4 Injection technique: single-shot Needle Needle type: Pencan  Needle gauge: 24 G Assessment Sensory level: T4

## 2017-10-02 NOTE — Discharge Summary (Signed)
OB Discharge Summary     Patient Name: Adrienne West DOB: [birth year] MRN: [id number]  Date of admission: 09/23/2017 Delivering MD: Thomasene Mohair, MD  Date of Delivery: 10/02/2017  Date of discharge: 10/04/2017  Admitting diagnosis:  Fetal growth restriction oligohydramnios  Intrauterine pregnancy: [redacted]w[redacted]d     Secondary diagnosis: Anemia     Discharge diagnosis: Preterm Pregnancy Delivered and Anemia                                                                                                Post partum procedures:none  Augmentation: n/a  Complications: None  Hospital course:  Sceduled C/S   27 y.o. yo G3P2103 at [redacted]w[redacted]d was admitted to the hospital 09/23/2017 for scheduled cesarean section with the following indication:Malpresentation and fetal growth restriction and oligohydramnios.  Membrane Rupture Time/Date:   ,    Patient delivered a Viable infant.10/02/2017  Details of operation can be found in separate operative note.  Pateint had an uncomplicated postpartum course.  She is ambulating, tolerating a regular diet, passing flatus, and urinating well. Patient is discharged home in stable condition on  10/04/17         Physical exam  Vitals:   10/03/17 2039 10/04/17 0049 10/04/17 0333 10/04/17 0811  BP: 114/74 (!) 95/57 (!) 99/58 111/67  Pulse: 77 76 65 76  Resp: 20 16 16 18   Temp: 97.8 F (36.6 C) 98 F (36.7 C) 97.8 F (36.6 C) 98.3 F (36.8 C)  TempSrc: Oral Oral Oral Oral  SpO2: 99% 99% 100% 99%  Weight:      Height:       General: alert, cooperative and no distress Lochia: appropriate Uterine Fundus: firm Incision: Healing well with no significant drainage DVT Evaluation: No evidence of DVT seen on physical exam.  Labs: Lab Results  Component Value Date   WBC 14.4 (H) 10/03/2017   HGB 9.6 (L) 10/03/2017   HCT 28.7 (L) 10/03/2017   MCV 96.3 10/03/2017   PLT 228 10/03/2017    Discharge instruction: per After Visit Summary.  Medications:  Allergies  as of 10/04/2017      Reactions   Hydrocodone Itching      Medication List    STOP taking these medications   escitalopram 10 MG tablet Commonly known as:  LEXAPRO   ranitidine 75 MG tablet Commonly known as:  ZANTAC     TAKE these medications   acetaminophen 500 MG tablet Commonly known as:  TYLENOL Take 1,000 mg by mouth every 6 (six) hours as needed for mild pain.   ibuprofen 600 MG tablet Commonly known as:  ADVIL,MOTRIN Take 1 tablet (600 mg total) by mouth every 6 (six) hours.   oxyCODONE 5 MG immediate release tablet Commonly known as:  Oxy IR/ROXICODONE Take 1 tablet (5 mg total) by mouth every 6 (six) hours as needed for severe pain.   PRENATAL + COMPLETE MULTI PO Take 1 tablet by mouth daily.            Discharge Care Instructions  (From admission, onward)        Start  Ordered   10/04/17 0000  Discharge wound care:    Comments:  Keep incision dry, clean.   10/04/17 0853      Diet: routine diet  Activity: Advance as tolerated. Pelvic rest for 6 weeks.   Outpatient follow up: Follow-up Information    Conard NovakJackson, Stephen D, MD Follow up in 1 week(s).   Specialty:  Obstetrics and Gynecology Why:  post op incision check Contact information: [address] [place] [address] [phone]             Postpartum contraception: Depo Provera prior to discharge and may switch to Nexplanon Rhogam Given postpartum: no Rubella vaccine given postpartum: no Varicella vaccine given postpartum: no TDaP given antepartum or postpartum: 09/01/17 Influenza vaccine given: 09/01/17  Newborn Data: Live born female  Birth Weight: 4 lb 3 oz (1900 g) APGAR: 6, 9  Newborn Delivery   Birth date/time:  10/02/2017 08:17:00 Delivery type:  C-Section, Low Transverse C-section categorization:  Primary     Baby Feeding: Bottle  Disposition:NICU  SIGNED: Tresea MallJane Amabel Stmarie, CNM

## 2017-10-03 ENCOUNTER — Encounter: Payer: Self-pay | Admitting: Obstetrics and Gynecology

## 2017-10-03 LAB — RPR: RPR Ser Ql: NONREACTIVE

## 2017-10-03 LAB — CBC
HEMATOCRIT: 28.7 % — AB (ref 35.0–47.0)
Hemoglobin: 9.6 g/dL — ABNORMAL LOW (ref 12.0–16.0)
MCH: 32.4 pg (ref 26.0–34.0)
MCHC: 33.7 g/dL (ref 32.0–36.0)
MCV: 96.3 fL (ref 80.0–100.0)
Platelets: 228 10*3/uL (ref 150–440)
RBC: 2.98 MIL/uL — AB (ref 3.80–5.20)
RDW: 12.9 % (ref 11.5–14.5)
WBC: 14.4 10*3/uL — ABNORMAL HIGH (ref 3.6–11.0)

## 2017-10-03 LAB — SURGICAL PATHOLOGY

## 2017-10-03 MED ORDER — MEDROXYPROGESTERONE ACETATE 150 MG/ML IM SUSP
150.0000 mg | Freq: Once | INTRAMUSCULAR | Status: DC
Start: 2017-10-04 — End: 2017-10-04
  Filled 2017-10-03: qty 1

## 2017-10-03 MED ORDER — ACETAMINOPHEN 325 MG PO TABS
650.0000 mg | ORAL_TABLET | ORAL | Status: DC | PRN
  Administered 2017-10-03 – 2017-10-04 (×4): 650 mg via ORAL
  Filled 2017-10-03 (×5): qty 2

## 2017-10-03 NOTE — Anesthesia Postprocedure Evaluation (Signed)
Anesthesia Post Note  Patient: Adrienne West  Procedure(s) Performed: CESAREAN SECTION (N/A )  Patient location during evaluation: Mother Baby Anesthesia Type: Spinal Level of consciousness: oriented and awake and alert Pain management: pain level controlled Vital Signs Assessment: post-procedure vital signs reviewed and stable Respiratory status: spontaneous breathing, respiratory function stable and patient connected to nasal cannula oxygen Cardiovascular status: blood pressure returned to baseline and stable Postop Assessment: no headache, no backache and no apparent nausea or vomiting Anesthetic complications: no     Last Vitals:  Vitals:   10/02/17 1945 10/02/17 2340  BP: 108/81 105/64  Pulse: 66 65  Resp: 20 18  Temp: 36.6 C 36.9 C  SpO2: 100% 99%    Last Pain:  Vitals:   10/03/17 0346  TempSrc:   PainSc: Asleep                 Davidjames Blansett,  Alessandra BevelsJennifer M

## 2017-10-03 NOTE — Progress Notes (Signed)
POD#1 pLTCS for oligohydramnios, IUGR, breech presentation Subjective:  Eating breakfast cross-legged in bed. Well-appearing and smiling. States that pain is well controlled and she would like to use Tylenol and ibuprofen only now. Ambulating and voiding without difficulty. Tolerating regular diet.  Objective:  Blood pressure 95/63, pulse 78, temperature 97.9 F (36.6 C), temperature source Oral, resp. rate 20, height 5\' 2"  (1.575 m), weight 134 lb (60.8 kg), last menstrual period 02/05/2017, SpO2 99 %, not currently breastfeeding.  General: NAD Pulmonary: no increased work of breathing Abdomen: non-distended, non-tender, fundus firm, lochia appropriate Incision: Dressing C/D/I, On-Q infusing Extremities: no edema, no erythema, no tenderness  Results for orders placed or performed during the hospital encounter of 09/23/17 (from the past 72 hour(s))  CBC     Status: Abnormal   Collection Time: 10/01/17  3:05 AM  Result Value Ref Range   WBC 13.4 (H) 3.6 - 11.0 K/uL   RBC 3.03 (L) 3.80 - 5.20 MIL/uL   Hemoglobin 9.8 (L) 12.0 - 16.0 g/dL   HCT 16.128.5 (L) 09.635.0 - 04.547.0 %   MCV 94.4 80.0 - 100.0 fL   MCH 32.5 26.0 - 34.0 pg   MCHC 34.4 32.0 - 36.0 g/dL   RDW 40.912.9 81.111.5 - 91.414.5 %   Platelets 218 150 - 440 K/uL    Comment: Performed at [facility], [address] [place], [place] [address]  Type and screen Preop Cesarean section     Status: None   Collection Time: 10/01/17  3:05 AM  Result Value Ref Range   ABO/RH(D) O POS    Antibody Screen NEG    Sample Expiration      10/04/2017 Performed at [facility], [address] [place], [place] [address]   CBC     Status: Abnormal   Collection Time: 10/02/17  7:03 AM  Result Value Ref Range   WBC 15.0 (H) 3.6 - 11.0 K/uL   RBC 3.02 (L) 3.80 - 5.20 MIL/uL   Hemoglobin 9.7 (L) 12.0 - 16.0 g/dL   HCT 86.528.8 (L) 78.435.0 - 69.647.0 %   MCV 95.5 80.0 - 100.0 fL   MCH 32.1 26.0 - 34.0 pg   MCHC 33.6 32.0 - 36.0 g/dL   RDW 29.512.9  28.411.5 - 13.214.5 %   Platelets 216 150 - 440 K/uL    Comment: Performed at [facility], [address] [place], [place] [address]  RPR     Status: None   Collection Time: 10/02/17  7:03 AM  Result Value Ref Range   RPR Ser Ql Non Reactive Non Reactive    Comment: (NOTE) Performed At: [facility] [address] [place], [place] [id number] Jolene SchimkeNagendra Sanjai MD [phone] Performed at [facility], [address] [place], [place] [address]   Rapid HIV screen (HIV 1/2 Ab+Ag)     Status: None   Collection Time: 10/02/17  7:03 AM  Result Value Ref Range   HIV-1 P24 Antigen - HIV24 NON REACTIVE NON REACTIVE   HIV 1/2 Antibodies NON REACTIVE NON REACTIVE   Interpretation (HIV Ag Ab)      A non reactive test result means that HIV 1 or HIV 2 antibodies and HIV 1 p24 antigen were not detected in the specimen.    Comment: Performed at [facility], [address] [place], [place] [address]  CBC     Status: Abnormal   Collection Time: 10/03/17  6:25 AM  Result Value Ref Range   WBC 14.4 (  H) 3.6 - 11.0 K/uL   RBC 2.98 (L) 3.80 - 5.20 MIL/uL   Hemoglobin 9.6 (L) 12.0 - 16.0 g/dL   HCT 16.1 (L) 09.6 - 04.5 %   MCV 96.3 80.0 - 100.0 fL   MCH 32.4 26.0 - 34.0 pg   MCHC 33.7 32.0 - 36.0 g/dL   RDW 40.9 81.1 - 91.4 %   Platelets 228 150 - 440 K/uL    Comment: Performed at [facility], [address] [place], [place] [address]     Assessment:   27 y.o. [phone] postoperative day #1 in good condition.  Plan:  1) Acute blood loss anemia - hemodynamically stable and asymptomatic - PO ferrous sulfate  2) Blood Type --/--/O POS (03/27 0305) / Rubella 1.31 (10/11 1353) / Varicella immune  3) TDAP status: given 09/01/17  4) Contraception: requests DepoProvera before discharge as bridge to planned BTL  5) Formula feeding  6) Disposition: continue postpartum care, anticipate discharge on POD# 2-3.  Marcelyn Bruins, CNM 10/03/2017  11:33 AM

## 2017-10-03 NOTE — Anesthesia Post-op Follow-up Note (Signed)
  Anesthesia Pain Follow-up Note  Patient: Adrienne West  Day #: 1  Date of Follow-up: 10/03/2017 Time: 7:51 AM  Last Vitals:  Vitals:   10/02/17 1945 10/02/17 2340  BP: 108/81 105/64  Pulse: 66 65  Resp: 20 18  Temp: 36.6 C 36.9 C  SpO2: 100% 99%    Level of Consciousness: alert  Pain: mild   Side Effects:None  Catheter Site Exam:clean     Plan: D/C from anesthesia care at surgeon's request  Rica MastBachich,  [place] M

## 2017-10-03 NOTE — Progress Notes (Signed)
FYI

## 2017-10-04 MED ORDER — IBUPROFEN 600 MG PO TABS: 600.0000 mg | ORAL_TABLET | Freq: Four times a day (QID) | ORAL | 0 refills | Status: DC

## 2017-10-04 MED ORDER — OXYCODONE HCL 5 MG PO TABS: 5.0000 mg | ORAL_TABLET | Freq: Four times a day (QID) | ORAL | 0 refills | Status: DC | PRN

## 2017-10-04 MED ORDER — MEDROXYPROGESTERONE ACETATE 150 MG/ML IM SUSP
150.0000 mg | Freq: Once | INTRAMUSCULAR | Status: AC
  Administered 2017-10-04: 150 mg via INTRAMUSCULAR
  Filled 2017-10-04: qty 1

## 2017-10-04 NOTE — Progress Notes (Signed)
Reviewed D/C instructions with pt and family. Pt verbalized understanding of teaching. Discharged to home via W/C. Pt to schedule f/u appt.  

## 2017-10-04 NOTE — Discharge Instructions (Signed)
Intrauterine Growth Restriction Intrauterine growth restriction (IUGR) is when your baby is not growing normally during your pregnancy. A baby with IUGR is smaller than it should be and may weigh less than normal at birth. IUGR can result if there is a problem with the organ that supplies your baby with oxygen and nutrition (placenta). Usually, there is no way to prevent this type of problem. What are the causes? The most common cause of IUGR is a problem with the placenta or umbilical cord that causes your developing baby to get less oxygen or nutrition than needed. Other causes include:  Poor maternal nutrition.  Chemicals found in substances such as cigarettes, alcohol, and some illegal drugs.  Some prescription medicines.  Congenital defects.  Genetic disorders.  An infection.  Carrying more than one baby, such as twins or triplets (multiple gestations).  What increases the risk? This condition is more likely to develop in women who:  Are over the age of 35 at the time of delivery (advanced maternal age).  Have medical conditions such as high blood pressure, diabetes, heart or kidney disease, anemia, or conditions that increase the risk for blood clotting.  Live at a very high altitude during pregnancy.  Have a personal history or family history of IUGR.  Take medicines during pregnancy that are linked with congenital disabilities.  Have a personal or family history of a genetic disorder.  Come into contact with infected cat feces (toxoplasmosis).  Come into contact with chickenpox (varicella) or German measles (rubella).  Have or are at risk of getting an infectious disease such as syphilis, HIV, or herpes.  Eat poorly during their pregnancy.  Weigh less than 100 pounds.  Have a family history of multiple gestations.  Have had infertility treatments.  Use tobacco, illegal drugs, or drink alcohol during pregnancy.  What are the signs or symptoms? IUGR does not  cause many symptoms. You might notice that your baby does not move or kick very often. Also, your belly may not be as big as expected for the stage of your pregnancy. How is this diagnosed? This condition is diagnosed with physical and prenatal exams. Your health care provider will measure the size of your baby inside your womb during a routine screening exam using sound waves (ultrasound). Your health care provider will compare the size of your baby to the size of other babies at the same stage of development (gestational age). Your health care provider will diagnose IUGR if your baby is smaller than 90 percent of all other babies at the same gestational age. You may also have tests to find the cause of IUGR. These can include:  Having fluid removed from your womb to check for signs of infection or a congenital disability (amniocentesis).  A series of tests to monitor your baby's health (fetal monitoring).  How is this treated? In most cases, treatment for this condition focuses on stopping the cause of your baby's small growth. Your health care providers will monitor your pregnancy closely and help you manage your pregnancy. If this condition is caused by a placenta problem and the baby is not getting enough blood, treatment may include:  Medicine to start labor and deliver your baby early (induction).  Cesarean delivery. In this procedure, your baby is delivered through a cut (incision) in the abdomen and womb (uterus).  Follow these instructions at home:  Make sure you are eating enough calories and gaining enough weight.  Eat a balanced diet. Work with a nutrition specialist (  dietitian), if needed.  Rest as needed. Try to get at least eight hours of sleep every night.  Do not drink alcohol.  Do not use illegal drugs.  Do not use any tobacco products, including cigarettes, chewing tobacco, or e-cigarettes. If you need help quitting, ask your health care provider.  Talk to your  health care provider about steps you can take to avoid infections.  Take medicines, vitamins, and mineral supplements only as directed by your health care provider. Make sure that your health care provider knows about all of the prescribed or over-the-counter medicines, supplements, vitamins, eye drops, and creams that you are using.  Keep all follow-up visits as directed by your health care provider. This is important. Contact a health care provider if:  Your baby is not moving as often as before. This information is not intended to replace advice given to you by your health care provider. Make sure you discuss any questions you have with your health care provider. Document Released: 04/02/2008 Document Revised: 11/30/2015 Document Reviewed: 06/20/2014 Elsevier Interactive Patient Education  2018 Elsevier Inc.  

## 2017-10-09 ENCOUNTER — Ambulatory Visit (INDEPENDENT_AMBULATORY_CARE_PROVIDER_SITE_OTHER): Payer: Medicaid Other | Admitting: Obstetrics & Gynecology

## 2017-10-09 ENCOUNTER — Encounter: Payer: Self-pay | Admitting: Obstetrics & Gynecology

## 2017-10-09 ENCOUNTER — Other Ambulatory Visit: Payer: Medicaid Other

## 2017-10-09 MED ORDER — HYDROXYZINE HCL 25 MG PO TABS: 25.0000 mg | ORAL_TABLET | Freq: Four times a day (QID) | ORAL | 2 refills | Status: DC | PRN

## 2017-10-09 MED ORDER — BUSPIRONE HCL 7.5 MG PO TABS: 7.5000 mg | ORAL_TABLET | Freq: Two times a day (BID) | ORAL | 3 refills | Status: DC

## 2017-10-09 NOTE — Patient Instructions (Signed)
Buspirone tablets What is this medicine? BUSPIRONE (byoo SPYE rone) is used to treat anxiety disorders. This medicine may be used for other purposes; ask your health care provider or pharmacist if you have questions. COMMON BRAND NAME(S): BuSpar What should I tell my health care provider before I take this medicine? They need to know if you have any of these conditions: -kidney or liver disease -an unusual or allergic reaction to buspirone, other medicines, foods, dyes, or preservatives -pregnant or trying to get pregnant -breast-feeding How should I use this medicine? Take this medicine by mouth with a glass of water. Follow the directions on the prescription label. You may take this medicine with or without food. To ensure that this medicine always works the same way for you, you should take it either always with or always without food. Take your doses at regular intervals. Do not take your medicine more often than directed. Do not stop taking except on the advice of your doctor or health care professional. Talk to your pediatrician regarding the use of this medicine in children. Special care may be needed. Overdosage: If you think you have taken too much of this medicine contact a poison control center or emergency room at once. NOTE: This medicine is only for you. Do not share this medicine with others. What if I miss a dose? If you miss a dose, take it as soon as you can. If it is almost time for your next dose, take only that dose. Do not take double or extra doses. What may interact with this medicine? Do not take this medicine with any of the following medications: -linezolid -MAOIs like Carbex, Eldepryl, Marplan, Nardil, and Parnate -methylene blue -procarbazine This medicine may also interact with the following medications: -diazepam -digoxin -diltiazem -erythromycin -grapefruit juice -haloperidol -medicines for mental depression or mood problems -medicines for seizures like  carbamazepine, phenobarbital and phenytoin -nefazodone -other medications for anxiety -rifampin -ritonavir -some antifungal medicines like itraconazole, ketoconazole, and voriconazole -verapamil -warfarin This list may not describe all possible interactions. Give your health care provider a list of all the medicines, herbs, non-prescription drugs, or dietary supplements you use. Also tell them if you smoke, drink alcohol, or use illegal drugs. Some items may interact with your medicine. What should I watch for while using this medicine? Visit your doctor or health care professional for regular checks on your progress. It may take 1 to 2 weeks before your anxiety gets better. You may get drowsy or dizzy. Do not drive, use machinery, or do anything that needs mental alertness until you know how this drug affects you. Do not stand or sit up quickly, especially if you are an older patient. This reduces the risk of dizzy or fainting spells. Alcohol can make you more drowsy and dizzy. Avoid alcoholic drinks. What side effects may I notice from receiving this medicine? Side effects that you should report to your doctor or health care professional as soon as possible: -blurred vision or other vision changes -chest pain -confusion -difficulty breathing -feelings of hostility or anger -muscle aches and pains -numbness or tingling in hands or feet -ringing in the ears -skin rash and itching -vomiting -weakness Side effects that usually do not require medical attention (report to your doctor or health care professional if they continue or are bothersome): -disturbed dreams, nightmares -headache -nausea -restlessness or nervousness -sore throat and nasal congestion -stomach upset This list may not describe all possible side effects. Call your doctor for medical advice about side   effects. You may report side effects to FDA at 1-800-FDA-1088. Where should I keep my medicine? Keep out of the reach  of children. Store at room temperature below 30 degrees C (86 degrees F). Protect from light. Keep container tightly closed. Throw away any unused medicine after the expiration date. NOTE: This sheet is a summary. It may not cover all possible information. If you have questions about this medicine, talk to your doctor, pharmacist, or health care provider.  2018 Elsevier/Gold Standard (2010-02-01 18:06:11)  

## 2017-10-10 NOTE — Progress Notes (Signed)
  [name] presents post op from CS for IUGR, [place], 1 week ago.  Subjective: Patient reports marked improvement in her preop symptoms. Eating a regular diet without difficulty. The patient is not having any pain.  Activity: normal activities of daily living. Patient reports vaginal sx's of None  Objective: BP 110/70 (BP Location: Right Arm, Patient Position: Sitting, Cuff Size: Normal)   Pulse 82   Ht 5\' 2"  (1.575 m)   Wt 130 lb (59 kg)   LMP 02/05/2017 (Approximate)   BMI 23.78 kg/m  Physical Exam  Constitutional: She is oriented to person, place, and time. She appears well-developed and well-nourished. No distress.  Cardiovascular: Normal rate.  Pulmonary/Chest: Effort normal.  Abdominal: Soft. She exhibits no distension. There is no tenderness.  Incision Healing Well   Musculoskeletal: Normal range of motion.  Neurological: She is alert and oriented to person, place, and time. No cranial nerve deficit.  Skin: Skin is warm and dry.  Psychiatric: She has a normal mood and affect.   Assessment: s/p :  cesarean section progressing well Infant in SCN progressing well per pt  Plan: Patient has done well after surgery with no apparent complications.  I have discussed the post-operative course to date, and the expected progress moving forward.  The patient understands what complications to be concerned about.  I will see the patient in routine follow up, or sooner if needed.    Activity plan: No heavy lifting.  Pelvic rest. PAP nv  Desires PP BTL, scheduled. BTL papers signed today. The patient has been fully informed about all methods of contraception, both temporary and permanent. She understands that tubal ligation is meant to be permanent, absolute and irreversible. She was told that there is an approximately 1 in 400 chance of a pregnancy in the future after tubal ligation. She was told the short and long term complications of tubal ligation. She  understands the risks from this surgery include, but are not limited to, the risks of anesthesia, hemorrhage, infection, perforation, and injury to adjacent structures, bowel, bladder and blood vessels.    Letitia Libraobert Paul Harris 10/10/2017, 8:00 AM

## 2017-10-29 ENCOUNTER — Other Ambulatory Visit: Payer: Self-pay

## 2017-10-29 ENCOUNTER — Encounter: Payer: Self-pay | Admitting: Psychiatry

## 2017-10-29 ENCOUNTER — Ambulatory Visit (INDEPENDENT_AMBULATORY_CARE_PROVIDER_SITE_OTHER): Payer: Medicaid Other | Admitting: Psychiatry

## 2017-10-29 VITALS — BP 117/81 | HR 85 | Temp 99.1°F | Wt 124.0 lb

## 2017-10-29 DIAGNOSIS — F17200 Nicotine dependence, unspecified, uncomplicated: Secondary | ICD-10-CM | POA: Diagnosis not present

## 2017-10-29 DIAGNOSIS — F3181 Bipolar II disorder: Secondary | ICD-10-CM | POA: Diagnosis not present

## 2017-10-29 DIAGNOSIS — F4312 Post-traumatic stress disorder, chronic: Secondary | ICD-10-CM

## 2017-10-29 DIAGNOSIS — F410 Panic disorder [episodic paroxysmal anxiety] without agoraphobia: Secondary | ICD-10-CM

## 2017-10-29 MED ORDER — LAMOTRIGINE 25 MG PO TABS: 25.0000 mg | ORAL_TABLET | Freq: Every day | ORAL | 0 refills | Status: DC

## 2017-10-29 MED ORDER — SERTRALINE HCL 25 MG PO TABS: 25.0000 mg | ORAL_TABLET | Freq: Every day | ORAL | 1 refills | Status: DC

## 2017-10-29 NOTE — Patient Instructions (Signed)
Sertraline tablets What is this medicine? SERTRALINE (SER tra leen) is used to treat depression. It may also be used to treat obsessive compulsive disorder, panic disorder, post-trauma stress, premenstrual dysphoric disorder (PMDD) or social anxiety. This medicine may be used for other purposes; ask your health care provider or pharmacist if you have questions. COMMON BRAND NAME(S): Zoloft What should I tell my health care provider before I take this medicine? They need to know if you have any of these conditions: -bleeding disorders -bipolar disorder or a family history of bipolar disorder -glaucoma -heart disease -high blood pressure -history of irregular heartbeat -history of low levels of calcium, magnesium, or potassium in the blood -if you often drink alcohol -liver disease -receiving electroconvulsive therapy -seizures -suicidal thoughts, plans, or attempt; a previous suicide attempt by you or a family member -take medicines that treat or prevent blood clots -thyroid disease -an unusual or allergic reaction to sertraline, other medicines, foods, dyes, or preservatives -pregnant or trying to get pregnant -breast-feeding How should I use this medicine? Take this medicine by mouth with a glass of water. Follow the directions on the prescription label. You can take it with or without food. Take your medicine at regular intervals. Do not take your medicine more often than directed. Do not stop taking this medicine suddenly except upon the advice of your doctor. Stopping this medicine too quickly may cause serious side effects or your condition may worsen. A special MedGuide will be given to you by the pharmacist with each prescription and refill. Be sure to read this information carefully each time. Talk to your pediatrician regarding the use of this medicine in children. While this drug may be prescribed for children as young as 7 years for selected conditions, precautions do  apply. Overdosage: If you think you have taken too much of this medicine contact a poison control center or emergency room at once. NOTE: This medicine is only for you. Do not share this medicine with others. What if I miss a dose? If you miss a dose, take it as soon as you can. If it is almost time for your next dose, take only that dose. Do not take double or extra doses. What may interact with this medicine? Do not take this medicine with any of the following medications: -cisapride -dofetilide -dronedarone -linezolid -MAOIs like Carbex, Eldepryl, Marplan, Nardil, and Parnate -methylene blue (injected into a vein) -pimozide -thioridazine This medicine may also interact with the following medications: -alcohol -amphetamines -aspirin and aspirin-like medicines -certain medicines for depression, anxiety, or psychotic disturbances -certain medicines for fungal infections like ketoconazole, fluconazole, posaconazole, and itraconazole -certain medicines for irregular heart beat like flecainide, quinidine, propafenone -certain medicines for migraine headaches like almotriptan, eletriptan, frovatriptan, naratriptan, rizatriptan, sumatriptan, zolmitriptan -certain medicines for sleep -certain medicines for seizures like carbamazepine, valproic acid, phenytoin -certain medicines that treat or prevent blood clots like warfarin, enoxaparin, dalteparin -cimetidine -digoxin -diuretics -fentanyl -isoniazid -lithium -NSAIDs, medicines for pain and inflammation, like ibuprofen or naproxen -other medicines that prolong the QT interval (cause an abnormal heart rhythm) -rasagiline -safinamide -supplements like St. John's wort, kava kava, valerian -tolbutamide -tramadol -tryptophan This list may not describe all possible interactions. Give your health care provider a list of all the medicines, herbs, non-prescription drugs, or dietary supplements you use. Also tell them if you smoke, drink  alcohol, or use illegal drugs. Some items may interact with your medicine. What should I watch for while using this medicine? Tell your doctor if your symptoms   do not get better or if they get worse. Visit your doctor or health care professional for regular checks on your progress. Because it may take several weeks to see the full effects of this medicine, it is important to continue your treatment as prescribed by your doctor. Patients and their families should watch out for new or worsening thoughts of suicide or depression. Also watch out for sudden changes in feelings such as feeling anxious, agitated, panicky, irritable, hostile, aggressive, impulsive, severely restless, overly excited and hyperactive, or not being able to sleep. If this happens, especially at the beginning of treatment or after a change in dose, call your health care professional. Bonita QuinYou may get drowsy or dizzy. Do not drive, use machinery, or do anything that needs mental alertness until you know how this medicine affects you. Do not stand or sit up quickly, especially if you are an older patient. This reduces the risk of dizzy or fainting spells. Alcohol may interfere with the effect of this medicine. Avoid alcoholic drinks. Your mouth may get dry. Chewing sugarless gum or sucking hard candy, and drinking plenty of water may help. Contact your doctor if the problem does not go away or is severe. What side effects may I notice from receiving this medicine? Side effects that you should report to your doctor or health care professional as soon as possible: -allergic reactions like skin rash, itching or hives, swelling of the face, lips, or tongue -anxious -black, tarry stools -changes in vision -confusion -elevated mood, decreased need for sleep, racing thoughts, impulsive behavior -eye pain -fast, irregular heartbeat -feeling faint or lightheaded, falls -feeling agitated, angry, or irritable -hallucination, loss of contact with  reality -loss of balance or coordination -loss of memory -painful or prolonged erections -restlessness, pacing, inability to keep still -seizures -stiff muscles -suicidal thoughts or other mood changes -trouble sleeping -unusual bleeding or bruising -unusually weak or tired -vomiting Side effects that usually do not require medical attention (report to your doctor or health care professional if they continue or are bothersome): -change in appetite or weight -change in sex drive or performance -diarrhea -increased sweating -indigestion, nausea -tremors This list may not describe all possible side effects. Call your doctor for medical advice about side effects. You may report side effects to FDA at 1-800-FDA-1088. Where should I keep my medicine? Keep out of the reach of children. Store at room temperature between 15 and 30 degrees C (59 and 86 degrees F). Throw away any unused medicine after the expiration date. NOTE: This sheet is a summary. It may not cover all possible information. If you have questions about this medicine, talk to your doctor, pharmacist, or health care provider.  2018 [employer] Standard (2016-06-28 14:17:49) Lamotrigine tablets What is this medicine? LAMOTRIGINE (la MOE Patrecia Pacetri jeen) is used to control seizures in adults and children with epilepsy and Lennox-Gastaut syndrome. It is also used in adults to treat bipolar disorder. This medicine may be used for other purposes; ask your health care provider or pharmacist if you have questions. COMMON BRAND NAME(S): Lamictal What should I tell my health care provider before I take this medicine? They need to know if you have any of these conditions: -a history of depression or bipolar disorder -aseptic meningitis during prior use of lamotrigine -folate deficiency -kidney disease -liver disease -suicidal thoughts, plans, or attempt; a previous suicide attempt by you or a family member -an unusual or allergic  reaction to lamotrigine or other seizure medications, other medicines, foods, dyes, or  preservatives -pregnant or trying to get pregnant -breast-feeding How should I use this medicine? Take this medicine by mouth with a glass of water. Follow the directions on the prescription label. Do not chew these tablets. If this medicine upsets your stomach, take it with food or milk. Take your doses at regular intervals. Do not take your medicine more often than directed. A special MedGuide will be given to you by the pharmacist with each new prescription and refill. Be sure to read this information carefully each time. Talk to your pediatrician regarding the use of this medicine in children. While this drug may be prescribed for children as young as 2 years for selected conditions, precautions do apply. Overdosage: If you think you have taken too much of this medicine contact a poison control center or emergency room at once. NOTE: This medicine is only for you. Do not share this medicine with others. What if I miss a dose? If you miss a dose, take it as soon as you can. If it is almost time for your next dose, take only that dose. Do not take double or extra doses. What may interact with this medicine? -carbamazepine -female hormones, including contraceptive or birth control pills -methotrexate -phenobarbital -phenytoin -primidone -pyrimethamine -rifampin -trimethoprim -valproic acid This list may not describe all possible interactions. Give your health care provider a list of all the medicines, herbs, non-prescription drugs, or dietary supplements you use. Also tell them if you smoke, drink alcohol, or use illegal drugs. Some items may interact with your medicine. What should I watch for while using this medicine? Visit your doctor or health care professional for regular checks on your progress. If you take this medicine for seizures, wear a Medic Alert bracelet or necklace. Carry an identification  card with information about your condition, medicines, and doctor or health care professional. It is important to take this medicine exactly as directed. When first starting treatment, your dose will need to be adjusted slowly. It may take weeks or months before your dose is stable. You should contact your doctor or health care professional if your seizures get worse or if you have any new types of seizures. Do not stop taking this medicine unless instructed by your doctor or health care professional. Stopping your medicine suddenly can increase your seizures or their severity. Contact your doctor or health care professional right away if you develop a rash while taking this medicine. Rashes may be very severe and sometimes require treatment in the hospital. Deaths from rashes have occurred. Serious rashes occur more often in children than adults taking this medicine. It is more common for these serious rashes to occur during the first 2 months of treatment, but a rash can occur at any time. You may get drowsy, dizzy, or have blurred vision. Do not drive, use machinery, or do anything that needs mental alertness until you know how this medicine affects you. To reduce dizzy or fainting spells, do not sit or stand up quickly, especially if you are an older patient. Alcohol can increase drowsiness and dizziness. Avoid alcoholic drinks. If you are taking this medicine for bipolar disorder, it is important to report any changes in your mood to your doctor or health care professional. If your condition gets worse, you get mentally depressed, feel very hyperactive or manic, have difficulty sleeping, or have thoughts of hurting yourself or committing suicide, you need to get help from your health care professional right away. If you are a caregiver for someone  taking this medicine for bipolar disorder, you should also report these behavioral changes right away. The use of this medicine may increase the chance of  suicidal thoughts or actions. Pay special attention to how you are responding while on this medicine. Your mouth may get dry. Chewing sugarless gum or sucking hard candy, and drinking plenty of water may help. Contact your doctor if the problem does not go away or is severe. Women who become pregnant while using this medicine may enroll in the [place] [employer] Antiepileptic Drug Pregnancy Registry by calling [phone]. This registry collects information about the safety of antiepileptic drug use during pregnancy. What side effects may I notice from receiving this medicine? Side effects that you should report to your doctor or health care professional as soon as possible: -allergic reactions like skin rash, itching or hives, swelling of the face, lips, or tongue -blurred or double vision -difficulty walking or controlling muscle movements -fever -headache, stiff neck, and sensitivity to light -painful sores in the mouth, eyes, or nose -redness, blistering, peeling or loosening of the skin, including inside the mouth -severe muscle pain -swollen lymph glands -uncontrollable eye movements -unusual bruising or bleeding -unusually weak or tired -vomiting -worsening of mood, thoughts or actions of suicide or dying -yellowing of the eyes or skin Side effects that usually do not require medical attention (report to your doctor or health care professional if they continue or are bothersome): -diarrhea or constipation -difficulty sleeping -nausea -tremors This list may not describe all possible side effects. Call your doctor for medical advice about side effects. You may report side effects to FDA at 1-800-FDA-1088. Where should I keep my medicine? Keep out of reach of children. Store at room temperature between 15 and 30 degrees C (59 and 86 degrees F). Throw away any unused medicine after the expiration date. NOTE: This sheet is a summary. It may not cover all possible information. If you  have questions about this medicine, talk to your doctor, pharmacist, or health care provider.  2018 [employer]/Gold Standard (2015-07-27 09:29:40)
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
## 2017-11-03 ENCOUNTER — Ambulatory Visit (INDEPENDENT_AMBULATORY_CARE_PROVIDER_SITE_OTHER): Payer: Medicaid Other | Admitting: Licensed Clinical Social Worker

## 2017-11-03 DIAGNOSIS — F3181 Bipolar II disorder: Secondary | ICD-10-CM

## 2017-11-03 DIAGNOSIS — F410 Panic disorder [episodic paroxysmal anxiety] without agoraphobia: Secondary | ICD-10-CM | POA: Diagnosis not present
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
## 2017-11-12 ENCOUNTER — Ambulatory Visit (INDEPENDENT_AMBULATORY_CARE_PROVIDER_SITE_OTHER): Payer: Medicaid Other | Admitting: Obstetrics & Gynecology

## 2017-11-12 ENCOUNTER — Encounter
Admission: RE | Admit: 2017-11-12 | Discharge: 2017-11-12 | Disposition: A | Discharge status: Home / Self Care | Payer: Medicaid Other | Source: Ambulatory Visit | Attending: Obstetrics & Gynecology | Admitting: Obstetrics & Gynecology

## 2017-11-12 ENCOUNTER — Other Ambulatory Visit: Payer: Self-pay

## 2017-11-12 ENCOUNTER — Encounter: Payer: Self-pay | Admitting: Obstetrics & Gynecology

## 2017-11-12 VITALS — BP 110/60 | Ht 62.0 in | Wt 124.0 lb

## 2017-11-12 DIAGNOSIS — Z124 Encounter for screening for malignant neoplasm of cervix: Secondary | ICD-10-CM

## 2017-11-12 DIAGNOSIS — Z3009 Encounter for other general counseling and advice on contraception: Secondary | ICD-10-CM

## 2017-11-12 HISTORY — DX: Anemia, unspecified: D64.9

## 2017-11-12 HISTORY — DX: Headache, unspecified: R51.9

## 2017-11-12 HISTORY — DX: Gastro-esophageal reflux disease without esophagitis: K21.9

## 2017-11-12 HISTORY — DX: Headache: R51

## 2017-11-12 NOTE — Patient Instructions (Signed)
Your procedure is scheduled on: 11-18-17 TUESDAY Report to Same Day Surgery 2nd floor medical mall [facility] Entrance-take elevator on left to 2nd floor.  Check in with surgery information desk.) To find out your arrival time please call [phone] between 1PM - 3PM on 11-17-17 MONDAY  Remember: Instructions that are not followed completely may result in serious medical risk, up to and including death, or upon the discretion of your surgeon and anesthesiologist your surgery may need to be rescheduled.    _x___ 1. Do not eat food after midnight the night before your procedure. NO GUM OR CANDY AFTER MIDNIGHT.  You may drink clear liquids up to 2 hours before you are scheduled to arrive at the hospital for your procedure.  Do not drink clear liquids within 2 hours of your scheduled arrival to the hospital.  Clear liquids include  --Water or Apple juice without pulp  --Clear carbohydrate beverage such as ClearFast or Gatorade  --Black Coffee or Clear Tea (No milk, no creamers, do not add anything to the coffee or Tea    __x__ 2. No Alcohol for 24 hours before or after surgery.   __x__3. No Smoking or e-cigarettes for 24 prior to surgery.  Do not use any chewable tobacco products for at least 6 hour prior to surgery   ____  4. Bring all medications with you on the day of surgery if instructed.    __x__ 5. Notify your doctor if there is any change in your medical condition     (cold, fever, infections).    x___6. On the morning of surgery brush your teeth with toothpaste and water.  You may rinse your mouth with mouth wash if you wish.  Do not swallow any toothpaste or mouthwash.   Do not wear jewelry, make-up, hairpins, clips or nail polish.  Do not wear lotions, powders, or perfumes. You may wear deodorant.  Do not shave 48 hours prior to surgery. Men may shave face and neck.  Do not bring valuables to the hospital.    [facility] is not responsible for any belongings or  valuables.               Contacts, dentures or bridgework may not be worn into surgery.  Leave your suitcase in the car. After surgery it may be brought to your room.  For patients admitted to the hospital, discharge time is determined by your treatment team.  _  Patients discharged the day of surgery will not be allowed to drive home.  You will need someone to drive you home and stay with you the night of your procedure.    Please read over the following fact sheets that you were given:   [facility] Preparing for Surgery and or MRSA Information   _x___ TAKE THE FOLLOWING MEDICATION THE MORNING OF SURGERY WITH A SMALL SIP OF WATER. These include:  1. LAMICTAL (LAMOTRIGENE)  2. ZOLOFT (SERTRALINE)  3. YOU MAY TAKE YOUR HYDROXYZINE DAY OF SURGERY IF NEEDED  4.  5.  6.  ____Fleets enema or Magnesium Citrate as directed.   _x___ Use CHG Soap or sage wipes as directed on instruction sheet   ____ Use inhalers on the day of surgery and bring to hospital day of surgery  ____ Stop Metformin and Janumet 2 days prior to surgery.    ____ Take 1/2 of usual insulin dose the night before surgery and none on the morning surgery.   ____ Follow recommendations from Cardiologist, Pulmonologist  or PCP regarding stopping Aspirin, Coumadin, Plavix ,Eliquis, Effient, or Pradaxa, and Pletal.  X____Stop Anti-inflammatories such as Advil, Aleve, Ibuprofen, Motrin, Naproxen, Naprosyn, Goodies powders or aspirin products NOW-OK to take Tylenol    ____ Stop supplements until after surgery.     ____ Bring C-Pap to the hospital.

## 2017-11-12 NOTE — H&P (View-Only) (Signed)
PRE-OPERATIVE HISTORY AND PHYSICAL EXAM  HPI:  Adrienne West is a 27 y.o. [phone] Patient's last menstrual period was 11/05/2017.; she is being admitted for surgery related to requested sterilization.  PMHx: Past Medical History:  Diagnosis Date  . Anxiety   . Bipolar disorder [facility])    Past Surgical History:  Procedure Laterality Date  . aspiration of breast cyst Right 2013  . CESAREAN SECTION N/A 10/02/2017   Procedure: CESAREAN SECTION;  Surgeon: Conard Novak, MD;  Location: [facility] ORS;  Service: Obstetrics;  Laterality: N/A;  . LAPAROSCOPIC APPENDECTOMY N/A 2012  . WISDOM TOOTH EXTRACTION     Family History  Problem Relation Age of Onset  . Bipolar disorder Mother   . Endometriosis Mother   . Breast cancer Mother 68  . Anxiety disorder Mother   . Depression Mother   . Stroke Father   . Hyperlipidemia Father   . Hypertension Father   . Bipolar disorder Sister   . Anxiety disorder Sister   . Depression Sister   . COPD Maternal Grandmother   . Hypothyroidism Maternal Grandmother   . Diabetes Maternal Grandfather   . Rheum arthritis Paternal Grandmother   . AAA (abdominal aortic aneurysm) Paternal Grandfather   . Stroke Paternal Grandfather    Social History   Tobacco Use  . Smoking status: Current Every Day Smoker    Packs/day: 1.00    Types: Cigarettes  . Smokeless tobacco: Never Used  . Tobacco comment: Quit 3 days ago  Substance Use Topics  . Alcohol use: No    Comment: social  . Drug use: No    Current Outpatient Medications:  .  acetaminophen (TYLENOL) 500 MG tablet, Take 1,000 mg by mouth every 6 (six) hours as needed (for headaches.). , Disp: , Rfl:  .  cetirizine (ZYRTEC) 10 MG tablet, Take 10 mg by mouth at bedtime., Disp: , Rfl:  .  hydrOXYzine (ATARAX/VISTARIL) 25 MG tablet, Take 1 tablet (25 mg total) by mouth every 6 (six) hours as needed for itching. Or anxiety. (Patient taking differently: Take 25 mg by mouth every 6 (six) hours as  needed for anxiety or itching. ), Disp: 30 tablet, Rfl: 2 .  lamoTRIgine (LAMICTAL) 25 MG tablet, Take 1-2 tablets (25-50 mg total) by mouth daily. Take 1 tablet for 14 days and then start taking 2 tablets., Disp: 46 tablet, Rfl: 0 .  sertraline (ZOLOFT) 25 MG tablet, Take 1 tablet (25 mg total) by mouth daily with breakfast., Disp: 30 tablet, Rfl: 1 .  simethicone (MYLICON) 125 MG chewable tablet, Chew 125 mg by mouth every 6 (six) hours as needed for flatulence., Disp: , Rfl:  Allergies: Hydrocodone  Review of Systems  Constitutional: Negative for chills, fever and malaise/fatigue.  HENT: Negative for congestion, sinus pain and sore throat.   Eyes: Negative for blurred vision and pain.  Respiratory: Negative for cough and wheezing.   Cardiovascular: Negative for chest pain and leg swelling.  Gastrointestinal: Negative for abdominal pain, constipation, diarrhea, heartburn, nausea and vomiting.  Genitourinary: Negative for dysuria, frequency, hematuria and urgency.  Musculoskeletal: Negative for back pain, joint pain, myalgias and neck pain.  Skin: Negative for itching and rash.  Neurological: Negative for dizziness, tremors and weakness.  Endo/Heme/Allergies: Does not bruise/bleed easily.  Psychiatric/Behavioral: Negative for depression. The patient is not nervous/anxious and does not have insomnia.    Objective: BP 110/60   Ht  (1.575 m)   Wt 124 lb (56.2 kg)  LMP 11/05/2017   BMI 22.68 kg/m   Filed Weights   11/12/17 1043  Weight: 124 lb (56.2 kg)   Physical Exam  Constitutional: She is oriented to person, place, and time. She appears well-developed and well-nourished. No distress.  Genitourinary: Rectum normal, vagina normal and uterus normal. Pelvic exam was performed with patient supine. There is no rash or lesion on the right labia. There is no rash or lesion on the left labia. Vagina exhibits no lesion. No bleeding in the vagina. Right adnexum does not display mass and  does not display tenderness. Left adnexum does not display mass and does not display tenderness. Cervix does not exhibit motion tenderness, lesion, friability or polyp.   Uterus is mobile and midaxial. Uterus is not enlarged or exhibiting a mass.  HENT:  Head: Normocephalic and atraumatic. Head is without laceration.  Right Ear: Hearing normal.  Left Ear: Hearing normal.  Nose: No epistaxis.  No foreign bodies.  Mouth/Throat: Uvula is midline, oropharynx is clear and moist and mucous membranes are normal.  Eyes: Pupils are equal, round, and reactive to light.  Neck: Normal range of motion. Neck supple. No thyromegaly present.  Cardiovascular: Normal rate and regular rhythm. Exam reveals no gallop and no friction rub.  No murmur heard. Pulmonary/Chest: Effort normal and breath sounds normal. No respiratory distress. She has no wheezes. Right breast exhibits no mass, no skin change and no tenderness. Left breast exhibits no mass, no skin change and no tenderness.  Abdominal: Soft. Bowel sounds are normal. She exhibits no distension. There is no tenderness. There is no rebound.  Musculoskeletal: Normal range of motion.  Neurological: She is alert and oriented to person, place, and time. No cranial nerve deficit.  Skin: Skin is warm and dry.  Psychiatric: She has a normal mood and affect. Judgment normal.  Vitals reviewed.   Assessment: 1. Screening for cervical cancer   2. Sterilization consult   3. Postpartum exam   Desires tubal for contraception over all other methods counseled.  The patient has been fully informed about all methods of contraception, both temporary and permanent. She understands that tubal ligation is meant to be permanent, absolute and irreversible. She was told that there is an approximately 1 in 400 chance of a pregnancy in the future after tubal ligation. She was told the short and long term complications of tubal ligation. She understands the risks from this surgery  include, but are not limited to, the risks of anesthesia, hemorrhage, infection, perforation, and injury to adjacent structures, bowel, bladder and blood vessels.   Paul Diamonte Stavely, MD, FACOG [facility], [facility] 11/12/2017  11:12 AM  

## 2017-11-12 NOTE — Progress Notes (Signed)
PRE-OPERATIVE HISTORY AND PHYSICAL EXAM  HPI:  Adrienne West is a 27 y.o. [phone] Patient's last menstrual period was 11/05/2017.; she is being admitted for surgery related to requested sterilization.  PMHx: Past Medical History:  Diagnosis Date  . Anxiety   . Bipolar disorder [facility])    Past Surgical History:  Procedure Laterality Date  . aspiration of breast cyst Right 2013  . CESAREAN SECTION N/A 10/02/2017   Procedure: CESAREAN SECTION;  Surgeon: Conard Novak, MD;  Location: [facility] ORS;  Service: Obstetrics;  Laterality: N/A;  . LAPAROSCOPIC APPENDECTOMY N/A 2012  . WISDOM TOOTH EXTRACTION     Family History  Problem Relation Age of Onset  . Bipolar disorder Mother   . Endometriosis Mother   . Breast cancer Mother 68  . Anxiety disorder Mother   . Depression Mother   . Stroke Father   . Hyperlipidemia Father   . Hypertension Father   . Bipolar disorder Sister   . Anxiety disorder Sister   . Depression Sister   . COPD Maternal Grandmother   . Hypothyroidism Maternal Grandmother   . Diabetes Maternal Grandfather   . Rheum arthritis Paternal Grandmother   . AAA (abdominal aortic aneurysm) Paternal Grandfather   . Stroke Paternal Grandfather    Social History   Tobacco Use  . Smoking status: Current Every Day Smoker    Packs/day: 1.00    Types: Cigarettes  . Smokeless tobacco: Never Used  . Tobacco comment: Quit 3 days ago  Substance Use Topics  . Alcohol use: No    Comment: social  . Drug use: No    Current Outpatient Medications:  .  acetaminophen (TYLENOL) 500 MG tablet, Take 1,000 mg by mouth every 6 (six) hours as needed (for headaches.). , Disp: , Rfl:  .  cetirizine (ZYRTEC) 10 MG tablet, Take 10 mg by mouth at bedtime., Disp: , Rfl:  .  hydrOXYzine (ATARAX/VISTARIL) 25 MG tablet, Take 1 tablet (25 mg total) by mouth every 6 (six) hours as needed for itching. Or anxiety. (Patient taking differently: Take 25 mg by mouth every 6 (six) hours as  needed for anxiety or itching. ), Disp: 30 tablet, Rfl: 2 .  lamoTRIgine (LAMICTAL) 25 MG tablet, Take 1-2 tablets (25-50 mg total) by mouth daily. Take 1 tablet for 14 days and then start taking 2 tablets., Disp: 46 tablet, Rfl: 0 .  sertraline (ZOLOFT) 25 MG tablet, Take 1 tablet (25 mg total) by mouth daily with breakfast., Disp: 30 tablet, Rfl: 1 .  simethicone (MYLICON) 125 MG chewable tablet, Chew 125 mg by mouth every 6 (six) hours as needed for flatulence., Disp: , Rfl:  Allergies: Hydrocodone  Review of Systems  Constitutional: Negative for chills, fever and malaise/fatigue.  HENT: Negative for congestion, sinus pain and sore throat.   Eyes: Negative for blurred vision and pain.  Respiratory: Negative for cough and wheezing.   Cardiovascular: Negative for chest pain and leg swelling.  Gastrointestinal: Negative for abdominal pain, constipation, diarrhea, heartburn, nausea and vomiting.  Genitourinary: Negative for dysuria, frequency, hematuria and urgency.  Musculoskeletal: Negative for back pain, joint pain, myalgias and neck pain.  Skin: Negative for itching and rash.  Neurological: Negative for dizziness, tremors and weakness.  Endo/Heme/Allergies: Does not bruise/bleed easily.  Psychiatric/Behavioral: Negative for depression. The patient is not nervous/anxious and does not have insomnia.    Objective: BP 110/60   Ht  (1.575 m)   Wt 124 lb (56.2 kg)  LMP 11/05/2017   BMI 22.68 kg/m   Filed Weights   11/12/17 1043  Weight: 124 lb (56.2 kg)   Physical Exam  Constitutional: She is oriented to person, place, and time. She appears well-developed and well-nourished. No distress.  Genitourinary: Rectum normal, vagina normal and uterus normal. Pelvic exam was performed with patient supine. There is no rash or lesion on the right labia. There is no rash or lesion on the left labia. Vagina exhibits no lesion. No bleeding in the vagina. Right adnexum does not display mass and  does not display tenderness. Left adnexum does not display mass and does not display tenderness. Cervix does not exhibit motion tenderness, lesion, friability or polyp.   Uterus is mobile and midaxial. Uterus is not enlarged or exhibiting a mass.  HENT:  Head: Normocephalic and atraumatic. Head is without laceration.  Right Ear: Hearing normal.  Left Ear: Hearing normal.  Nose: No epistaxis.  No foreign bodies.  Mouth/Throat: Uvula is midline, oropharynx is clear and moist and mucous membranes are normal.  Eyes: Pupils are equal, round, and reactive to light.  Neck: Normal range of motion. Neck supple. No thyromegaly present.  Cardiovascular: Normal rate and regular rhythm. Exam reveals no gallop and no friction rub.  No murmur heard. Pulmonary/Chest: Effort normal and breath sounds normal. No respiratory distress. She has no wheezes. Right breast exhibits no mass, no skin change and no tenderness. Left breast exhibits no mass, no skin change and no tenderness.  Abdominal: Soft. Bowel sounds are normal. She exhibits no distension. There is no tenderness. There is no rebound.  Musculoskeletal: Normal range of motion.  Neurological: She is alert and oriented to person, place, and time. No cranial nerve deficit.  Skin: Skin is warm and dry.  Psychiatric: She has a normal mood and affect. Judgment normal.  Vitals reviewed.   Assessment: 1. Screening for cervical cancer   2. Sterilization consult   3. Postpartum exam   Desires tubal for contraception over all other methods counseled.  The patient has been fully informed about all methods of contraception, both temporary and permanent. She understands that tubal ligation is meant to be permanent, absolute and irreversible. She was told that there is an approximately 1 in 400 chance of a pregnancy in the future after tubal ligation. She was told the short and long term complications of tubal ligation. She understands the risks from this surgery  include, but are not limited to, the risks of anesthesia, hemorrhage, infection, perforation, and injury to adjacent structures, bowel, bladder and blood vessels.   Annamarie Major, MD, [facility], [facility] 11/12/2017  11:12 AM

## 2017-11-12 NOTE — Progress Notes (Signed)
  OBSTETRICS POSTPARTUM CLINIC PROGRESS NOTE  Subjective:     Adrienne West is a 27 y.o. [phone] female who presents for a postpartum visit. She is 6 weeks postpartum following a Pregnancy complicated by: IUGR, Oligo and delivery by C-section.  I have fully reviewed the prenatal and intrapartum course. Anesthesia: spinal.  Postpartum course has been complicated by uncomplicated.  Baby is feeding by Bottle.  Bleeding: patient has not  resumed menses.  Bowel function is normal. Bladder function is normal.  Patient is not sexually active. Contraception method desired (scheduled soon) is tubal ligation.  Postpartum depression screening: positive. Edinburgh 11. Sees psychiatry and is on meds.  The following portions of the patient's history were reviewed and updated as appropriate: allergies, current medications, past family history, past medical history, past social history, past surgical history and problem list.  Review of Systems Pertinent items noted in HPI and remainder of comprehensive ROS otherwise negative.  Objective:    BP 110/60   Ht  (1.575 m)   Wt 124 lb (56.2 kg)   LMP 11/05/2017   BMI 22.68 kg/m   General:  alert and no distress   Breasts:  inspection negative, no nipple discharge or bleeding, no masses or nodularity palpable  Lungs: clear to auscultation bilaterally  Heart:  regular rate and rhythm, S1, S2 normal, no murmur, click, rub or gallop  Abdomen: soft, non-tender; bowel sounds normal; no masses,  no organomegaly.  Well healed Pfannenstiel incision   Vulva:  normal  Vagina: normal vagina, no discharge, exudate, lesion, or erythema  Cervix:  no cervical motion tenderness and no lesions  Corpus: normal size, contour, position, consistency, mobility, non-tender  Adnexa:  normal adnexa and no mass, fullness, tenderness  Rectal Exam: Not performed.        Assessment:  Post Partum Care visit 1. Screening for cervical cancer - IGP, rfx Aptima HPV  ASCU 2. Plan for sterilization  Plan:  See orders and Patient Instructions Resume all normal activities Follow up in: 3  weeks for post op and as needed.   Annamarie Major, MD, [facility], [facility] 11/12/2017  11:12 AM

## 2017-11-13 ENCOUNTER — Encounter
Admission: RE | Admit: 2017-11-13 | Discharge: 2017-11-13 | Disposition: A | Discharge status: Home / Self Care | Payer: Medicaid Other | Source: Ambulatory Visit | Attending: Obstetrics & Gynecology | Admitting: Obstetrics & Gynecology

## 2017-11-13 DIAGNOSIS — Z302 Encounter for sterilization: Secondary | ICD-10-CM | POA: Diagnosis present

## 2017-11-13 LAB — CBC
HEMATOCRIT: 38.5 % (ref 35.0–47.0)
HEMOGLOBIN: 13 g/dL (ref 12.0–16.0)
MCH: 31.5 pg (ref 26.0–34.0)
MCHC: 33.8 g/dL (ref 32.0–36.0)
MCV: 93 fL (ref 80.0–100.0)
Platelets: 310 10*3/uL (ref 150–440)
RBC: 4.14 MIL/uL (ref 3.80–5.20)
RDW: 13.3 % (ref 11.5–14.5)
WBC: 11 10*3/uL (ref 3.6–11.0)

## 2017-11-13 LAB — TYPE AND SCREEN
ABO/RH(D): O POS
ANTIBODY SCREEN: NEGATIVE

## 2017-11-14 LAB — IGP, RFX APTIMA HPV ASCU: PAP Smear Comment: 0

## 2017-11-18 ENCOUNTER — Ambulatory Visit: Payer: Medicaid Other | Admitting: Anesthesiology

## 2017-11-18 ENCOUNTER — Encounter: Payer: Self-pay | Admitting: *Deleted

## 2017-11-18 ENCOUNTER — Encounter
Admission: RE | Disposition: A | Discharge status: Home / Self Care | Payer: Self-pay | Source: Ambulatory Visit | Attending: Obstetrics & Gynecology

## 2017-11-18 ENCOUNTER — Other Ambulatory Visit: Payer: Self-pay

## 2017-11-18 ENCOUNTER — Ambulatory Visit
Admission: RE | Admit: 2017-11-18 | Discharge: 2017-11-18 | Disposition: A | Discharge status: Home / Self Care | Payer: Medicaid Other | Source: Ambulatory Visit | Attending: Obstetrics & Gynecology | Admitting: Obstetrics & Gynecology

## 2017-11-18 DIAGNOSIS — F319 Bipolar disorder, unspecified: Secondary | ICD-10-CM | POA: Insufficient documentation

## 2017-11-18 DIAGNOSIS — Z885 Allergy status to narcotic agent status: Secondary | ICD-10-CM | POA: Insufficient documentation

## 2017-11-18 DIAGNOSIS — Z302 Encounter for sterilization: Secondary | ICD-10-CM | POA: Diagnosis not present

## 2017-11-18 DIAGNOSIS — Z888 Allergy status to other drugs, medicaments and biological substances status: Secondary | ICD-10-CM | POA: Diagnosis not present

## 2017-11-18 DIAGNOSIS — F17210 Nicotine dependence, cigarettes, uncomplicated: Secondary | ICD-10-CM | POA: Insufficient documentation

## 2017-11-18 DIAGNOSIS — T8189XA Other complications of procedures, not elsewhere classified, initial encounter: Secondary | ICD-10-CM | POA: Insufficient documentation

## 2017-11-18 DIAGNOSIS — K219 Gastro-esophageal reflux disease without esophagitis: Secondary | ICD-10-CM | POA: Insufficient documentation

## 2017-11-18 DIAGNOSIS — Z79899 Other long term (current) drug therapy: Secondary | ICD-10-CM | POA: Insufficient documentation

## 2017-11-18 DIAGNOSIS — F419 Anxiety disorder, unspecified: Secondary | ICD-10-CM | POA: Diagnosis not present

## 2017-11-18 DIAGNOSIS — Y838 Other surgical procedures as the cause of abnormal reaction of the patient, or of later complication, without mention of misadventure at the time of the procedure: Secondary | ICD-10-CM | POA: Diagnosis not present

## 2017-11-18 HISTORY — PX: LAPAROSCOPIC BILATERAL SALPINGECTOMY: SHX5889

## 2017-11-18 LAB — POCT PREGNANCY, URINE: Preg Test, Ur: NEGATIVE

## 2017-11-18 SURGERY — SALPINGECTOMY, BILATERAL, LAPAROSCOPIC
Anesthesia: General

## 2017-11-18 MED ORDER — OXYCODONE-ACETAMINOPHEN 5-325 MG PO TABS: 1.0000 | ORAL_TABLET | ORAL | 0 refills | Status: DC | PRN

## 2017-11-18 MED ORDER — EPHEDRINE SULFATE 50 MG/ML IJ SOLN
INTRAMUSCULAR | Status: AC
  Filled 2017-11-18: qty 1

## 2017-11-18 MED ORDER — KETOROLAC TROMETHAMINE 30 MG/ML IJ SOLN: 30.0000 mg | Freq: Once | INTRAMUSCULAR | Status: DC | PRN

## 2017-11-18 MED ORDER — FENTANYL CITRATE (PF) 100 MCG/2ML IJ SOLN
INTRAMUSCULAR | Status: AC
  Administered 2017-11-18: 50 ug via INTRAVENOUS
  Filled 2017-11-18: qty 2

## 2017-11-18 MED ORDER — ROCURONIUM BROMIDE 50 MG/5ML IV SOLN
INTRAVENOUS | Status: AC
  Filled 2017-11-18: qty 1

## 2017-11-18 MED ORDER — KETOROLAC TROMETHAMINE 30 MG/ML IJ SOLN
INTRAMUSCULAR | Status: DC | PRN
  Administered 2017-11-18: 15 mg via INTRAVENOUS

## 2017-11-18 MED ORDER — FENTANYL CITRATE (PF) 100 MCG/2ML IJ SOLN
25.0000 ug | INTRAMUSCULAR | Status: DC | PRN
  Administered 2017-11-18 (×3): 50 ug via INTRAVENOUS

## 2017-11-18 MED ORDER — SUGAMMADEX SODIUM 200 MG/2ML IV SOLN
INTRAVENOUS | Status: DC | PRN
  Administered 2017-11-18: 200 mg via INTRAVENOUS

## 2017-11-18 MED ORDER — ACETAMINOPHEN 650 MG RE SUPP
650.0000 mg | RECTAL | Status: DC | PRN
  Filled 2017-11-18: qty 1

## 2017-11-18 MED ORDER — EPHEDRINE SULFATE 50 MG/ML IJ SOLN
INTRAMUSCULAR | Status: DC | PRN
  Administered 2017-11-18: 10 mg via INTRAVENOUS

## 2017-11-18 MED ORDER — FENTANYL CITRATE (PF) 100 MCG/2ML IJ SOLN
INTRAMUSCULAR | Status: AC
  Filled 2017-11-18: qty 2

## 2017-11-18 MED ORDER — PROMETHAZINE HCL 25 MG/ML IJ SOLN: 6.2500 mg | INTRAMUSCULAR | Status: DC | PRN

## 2017-11-18 MED ORDER — MIDAZOLAM HCL 2 MG/2ML IJ SOLN
INTRAMUSCULAR | Status: DC | PRN
  Administered 2017-11-18: 2 mg via INTRAVENOUS

## 2017-11-18 MED ORDER — ONDANSETRON HCL 4 MG/2ML IJ SOLN
INTRAMUSCULAR | Status: DC | PRN
  Administered 2017-11-18: 4 mg via INTRAVENOUS

## 2017-11-18 MED ORDER — ROCURONIUM BROMIDE 100 MG/10ML IV SOLN
INTRAVENOUS | Status: DC | PRN
  Administered 2017-11-18: 40 mg via INTRAVENOUS

## 2017-11-18 MED ORDER — DEXAMETHASONE SODIUM PHOSPHATE 10 MG/ML IJ SOLN
INTRAMUSCULAR | Status: AC
  Filled 2017-11-18: qty 1

## 2017-11-18 MED ORDER — PROPOFOL 10 MG/ML IV BOLUS
INTRAVENOUS | Status: DC | PRN
  Administered 2017-11-18: 100 mg via INTRAVENOUS

## 2017-11-18 MED ORDER — MIDAZOLAM HCL 2 MG/2ML IJ SOLN
INTRAMUSCULAR | Status: AC
  Filled 2017-11-18: qty 2

## 2017-11-18 MED ORDER — LACTATED RINGERS IV SOLN: INTRAVENOUS | Status: DC

## 2017-11-18 MED ORDER — FAMOTIDINE 20 MG PO TABS
ORAL_TABLET | ORAL | Status: AC
  Administered 2017-11-18: 20 mg via ORAL
  Filled 2017-11-18: qty 1

## 2017-11-18 MED ORDER — ACETAMINOPHEN 325 MG PO TABS: 650.0000 mg | ORAL_TABLET | ORAL | Status: DC | PRN

## 2017-11-18 MED ORDER — FAMOTIDINE 20 MG PO TABS
20.0000 mg | ORAL_TABLET | Freq: Once | ORAL | Status: AC
  Administered 2017-11-18: 20 mg via ORAL

## 2017-11-18 MED ORDER — ACETAMINOPHEN 325 MG PO TABS: 325.0000 mg | ORAL_TABLET | ORAL | Status: DC | PRN

## 2017-11-18 MED ORDER — BUPIVACAINE HCL (PF) 0.5 % IJ SOLN
INTRAMUSCULAR | Status: AC
  Filled 2017-11-18: qty 30

## 2017-11-18 MED ORDER — PHENYLEPHRINE HCL 10 MG/ML IJ SOLN
INTRAMUSCULAR | Status: AC
  Filled 2017-11-18: qty 1

## 2017-11-18 MED ORDER — ONDANSETRON HCL 4 MG/2ML IJ SOLN
INTRAMUSCULAR | Status: AC
  Filled 2017-11-18: qty 2

## 2017-11-18 MED ORDER — SUGAMMADEX SODIUM 200 MG/2ML IV SOLN
INTRAVENOUS | Status: AC
  Filled 2017-11-18: qty 2

## 2017-11-18 MED ORDER — PROPOFOL 10 MG/ML IV BOLUS
INTRAVENOUS | Status: AC
  Filled 2017-11-18: qty 20

## 2017-11-18 MED ORDER — PHENYLEPHRINE HCL 10 MG/ML IJ SOLN
INTRAMUSCULAR | Status: DC | PRN
  Administered 2017-11-18: 100 ug via INTRAVENOUS

## 2017-11-18 MED ORDER — DEXAMETHASONE SODIUM PHOSPHATE 10 MG/ML IJ SOLN
INTRAMUSCULAR | Status: DC | PRN
  Administered 2017-11-18: 5 mg via INTRAVENOUS

## 2017-11-18 MED ORDER — MORPHINE SULFATE (PF) 4 MG/ML IV SOLN: 1.0000 mg | INTRAVENOUS | Status: DC | PRN

## 2017-11-18 MED ORDER — KETOROLAC TROMETHAMINE 30 MG/ML IJ SOLN
30.0000 mg | Freq: Four times a day (QID) | INTRAMUSCULAR | Status: DC
  Filled 2017-11-18: qty 1

## 2017-11-18 MED ORDER — ACETAMINOPHEN 160 MG/5ML PO SOLN
325.0000 mg | ORAL | Status: DC | PRN
  Filled 2017-11-18: qty 20.3

## 2017-11-18 MED ORDER — LIDOCAINE HCL (PF) 2 % IJ SOLN
INTRAMUSCULAR | Status: AC
  Filled 2017-11-18: qty 10

## 2017-11-18 MED ORDER — OXYCODONE-ACETAMINOPHEN 5-325 MG PO TABS: 1.0000 | ORAL_TABLET | ORAL | Status: DC | PRN

## 2017-11-18 MED ORDER — GLYCOPYRROLATE 0.2 MG/ML IJ SOLN
INTRAMUSCULAR | Status: DC | PRN
  Administered 2017-11-18: 0.2 mg via INTRAVENOUS

## 2017-11-18 MED ORDER — MEPERIDINE HCL 50 MG/ML IJ SOLN: 6.2500 mg | INTRAMUSCULAR | Status: DC | PRN

## 2017-11-18 MED ORDER — BUPIVACAINE HCL (PF) 0.5 % IJ SOLN
INTRAMUSCULAR | Status: DC | PRN
  Administered 2017-11-18: 10 mL

## 2017-11-18 MED ORDER — LIDOCAINE HCL (CARDIAC) PF 100 MG/5ML IV SOSY
PREFILLED_SYRINGE | INTRAVENOUS | Status: DC | PRN
  Administered 2017-11-18: 50 mg via INTRAVENOUS

## 2017-11-18 MED ORDER — FENTANYL CITRATE (PF) 100 MCG/2ML IJ SOLN
INTRAMUSCULAR | Status: DC | PRN
  Administered 2017-11-18 (×4): 50 ug via INTRAVENOUS

## 2017-11-18 MED ORDER — LACTATED RINGERS IV SOLN
INTRAVENOUS | Status: DC
  Administered 2017-11-18: 12:00:00 via INTRAVENOUS

## 2017-11-18 SURGICAL SUPPLY — 39 items
BLADE SURG SZ11 CARB STEEL (BLADE) ×3 IMPLANT
CANISTER SUCT 1200ML W/VALVE (MISCELLANEOUS) ×3 IMPLANT
CATH ROBINSON RED A/P 16FR (CATHETERS) IMPLANT
CHLORAPREP W/TINT 26ML (MISCELLANEOUS) ×3 IMPLANT
DERMABOND ADVANCED (GAUZE/BANDAGES/DRESSINGS) ×2
DERMABOND ADVANCED .7 DNX12 (GAUZE/BANDAGES/DRESSINGS) ×1 IMPLANT
DRAPE LAPAROTOMY TRNSV 106X77 (MISCELLANEOUS) ×3 IMPLANT
DRSG TELFA 4X3 1S NADH ST (GAUZE/BANDAGES/DRESSINGS) IMPLANT
GLOVE BIO SURGEON STRL SZ8 (GLOVE) ×12 IMPLANT
GLOVE INDICATOR 8.0 STRL GRN (GLOVE) ×12 IMPLANT
GOWN STRL REUS W/ TWL LRG LVL3 (GOWN DISPOSABLE) ×1 IMPLANT
GOWN STRL REUS W/ TWL XL LVL3 (GOWN DISPOSABLE) ×1 IMPLANT
GOWN STRL REUS W/TWL LRG LVL3 (GOWN DISPOSABLE) ×2
GOWN STRL REUS W/TWL XL LVL3 (GOWN DISPOSABLE) ×2
GRASPER SUT TROCAR 14GX15 (MISCELLANEOUS) IMPLANT
IRRIGATION STRYKERFLOW (MISCELLANEOUS) IMPLANT
IRRIGATOR STRYKERFLOW (MISCELLANEOUS)
IV LACTATED RINGERS 1000ML (IV SOLUTION) IMPLANT
KIT PINK PAD W/HEAD ARE REST (MISCELLANEOUS) ×3
KIT PINK PAD W/HEAD ARM REST (MISCELLANEOUS) ×1 IMPLANT
LABEL OR SOLS (LABEL) ×3 IMPLANT
NEEDLE VERESS 14GA 120MM (NEEDLE) ×3 IMPLANT
NS IRRIG 500ML POUR BTL (IV SOLUTION) ×3 IMPLANT
PACK GYN LAPAROSCOPIC (MISCELLANEOUS) ×3 IMPLANT
PAD PREP 24X41 OB/GYN DISP (PERSONAL CARE ITEMS) ×3 IMPLANT
POUCH SPECIMEN RETRIEVAL 10MM (ENDOMECHANICALS) IMPLANT
SCISSORS METZENBAUM CVD 33 (INSTRUMENTS) IMPLANT
SHEARS HARMONIC ACE PLUS 36CM (ENDOMECHANICALS) ×3 IMPLANT
SLEEVE ENDOPATH XCEL 5M (ENDOMECHANICALS) ×3 IMPLANT
SPONGE GAUZE 2X2 8PLY STER LF (GAUZE/BANDAGES/DRESSINGS)
SPONGE GAUZE 2X2 8PLY STRL LF (GAUZE/BANDAGES/DRESSINGS) IMPLANT
STRAP SAFETY 5IN WIDE (MISCELLANEOUS) ×3 IMPLANT
SUT VIC AB 0 CT1 36 (SUTURE) ×3 IMPLANT
SUT VIC AB 2-0 UR6 27 (SUTURE) IMPLANT
SUT VIC AB 4-0 PS2 18 (SUTURE) IMPLANT
SYR 10ML LL (SYRINGE) ×3 IMPLANT
TROCAR ENDO BLADELESS 11MM (ENDOMECHANICALS) IMPLANT
TROCAR XCEL NON-BLD 5MMX100MML (ENDOMECHANICALS) ×3 IMPLANT
TUBING INSUFFLATION (TUBING) ×3 IMPLANT

## 2017-11-18 NOTE — Progress Notes (Signed)
toradol given in or  No need for more per dr Tiburcio Pea

## 2017-11-18 NOTE — Anesthesia Procedure Notes (Signed)
Procedure Name: Intubation Date/Time: 11/18/2017 2:07 PM Performed by: Eben Burow, CRNA Pre-anesthesia Checklist: Patient identified, Emergency Drugs available, Suction available, Patient being monitored and Timeout performed Patient Re-evaluated:Patient Re-evaluated prior to induction Oxygen Delivery Method: Circle system utilized Preoxygenation: Pre-oxygenation with 100% oxygen Induction Type: IV induction Ventilation: Mask ventilation without difficulty Laryngoscope Size: Mac and 3 Grade View: Grade I Tube type: Oral Tube size: 7.0 mm Number of attempts: 1 Airway Equipment and Method: Stylet and LTA kit utilized Placement Confirmation: ETT inserted through vocal cords under direct vision,  positive ETCO2 and breath sounds checked- equal and bilateral Secured at: 21 cm Tube secured with: Tape Dental Injury: Teeth and Oropharynx as per pre-operative assessment

## 2017-11-18 NOTE — Transfer of Care (Signed)
Immediate Anesthesia Transfer of Care Note  Patient: Adrienne West  Procedure(s) Performed: Procedure(s): LAPAROSCOPIC PARTIAL SALPINGECTOMY (N/A)  Patient Location: PACU  Anesthesia Type:General  Level of Consciousness: sedated  Airway & Oxygen Therapy: Patient Spontanous Breathing and Patient connected to face mask oxygen  Post-op Assessment: Report given to RN and Post -op Vital signs reviewed and stable  Post vital signs: Reviewed and stable  Last Vitals:  Vitals:   11/18/17 1207 11/18/17 1502  BP: 101/68 112/70  Pulse: 73   Resp: 18 19  Temp: (!) 36.3 C   SpO2: 100% 100%    Complications: No apparent anesthesia complications

## 2017-11-18 NOTE — Discharge Instructions (Signed)
Laparoscopic Tubal Ligation, Care After °Refer to this sheet in the next few weeks. These instructions provide you with information about caring for yourself after your procedure. Your health care provider may also give you more specific instructions. Your treatment has been planned according to current medical practices, but problems sometimes occur. Call your health care provider if you have any problems or questions after your procedure. °What can I expect after the procedure? °After the procedure, it is common to have: °· A sore throat. °· Discomfort in your shoulder. °· Mild discomfort or cramping in your abdomen. °· Gas pains. °· Pain or soreness in the area where the surgical cut (incision) was made. °· A bloated feeling. °· Tiredness. °· Nausea. °· Vomiting. ° °Follow these instructions at home: °Medicines °· Take over-the-counter and prescription medicines only as told by your health care provider. °· Do not take aspirin because it can cause bleeding. °· Do not drive or operate heavy machinery while taking prescription pain medicine. °Activity °· Rest for the rest of the day. °· Return to your normal activities as told by your health care provider. Ask your health care provider what activities are safe for you. °Incision care ° °· Follow instructions from your health care provider about how to take care of your incision. Make sure you: °? Wash your hands with soap and water before you change your bandage (dressing). If soap and water are not available, use hand sanitizer. °? Change your dressing as told by your health care provider. °? Leave stitches (sutures) in place. They may need to stay in place for 2 weeks or longer. °· Check your incision area every day for signs of infection. Check for: °? More redness, swelling, or pain. °? More fluid or blood. °? Warmth. °? Pus or a bad smell. °Other Instructions °· Do not take baths, swim, or use a hot tub until your health care provider approves. You may take  showers. °· Keep all follow-up visits as told by your health care provider. This is important. °· Have someone help you with your daily household tasks for the first few days. °Contact a health care provider if: °· You have more redness, swelling, or pain around your incision. °· Your incision feels warm to the touch. °· You have pus or a bad smell coming from your incision. °· The edges of your incision break open after the sutures have been removed. °· Your pain does not improve after 2-3 days. °· You have a rash. °· You repeatedly become dizzy or light-headed. °· Your pain medicine is not helping. °· You are constipated. °Get help right away if: °· You have a fever. °· You faint. °· You have increasing pain in your abdomen. °· You have severe pain in one or both of your shoulders. °· You have fluid or blood coming from your sutures or from your vagina. °· You have shortness of breath or difficulty breathing. °· You have chest pain or leg pain. °· You have ongoing nausea, vomiting, or diarrhea. °This information is not intended to replace advice given to you by your health care provider. Make sure you discuss any questions you have with your health care provider. °Document Released: 01/11/2005 Document Revised: 11/27/2015 Document Reviewed: 06/04/2015 °Elsevier Interactive Patient Education © 2018 [employer] ° ° °AMBULATORY SURGERY  °DISCHARGE INSTRUCTIONS ° ° °1) The drugs that you were given will stay in your system until tomorrow so for the next 24 hours you should not: ° °A) Drive   an automobile °B) Make any legal decisions °C) Drink any alcoholic beverage ° ° °2) You may resume regular meals tomorrow.  Today it is better to start with liquids and gradually work up to solid foods. ° °You may eat anything you prefer, but it is better to start with liquids, then soup and crackers, and gradually work up to solid foods. ° ° °3) Please notify your doctor immediately if you have any unusual bleeding, trouble  breathing, redness and pain at the surgery site, drainage, fever, or pain not relieved by medication. ° ° ° °4) Additional Instructions: ° ° ° ° ° ° ° °Please contact your physician with any problems or Same Day Surgery at [phone], Monday through Friday 6 am to 4 pm, or [facility] Main number at [phone]. °

## 2017-11-18 NOTE — Anesthesia Preprocedure Evaluation (Signed)
Anesthesia Evaluation  Patient identified by MRN, date of birth, ID band Patient awake    Reviewed: Allergy & Precautions, H&P , NPO status , reviewed documented beta blocker date and time   Airway Mallampati: II  TM Distance: >3 FB Neck ROM: full    Dental  (+) Poor Dentition, Chipped, Missing   Pulmonary Current Smoker,    Pulmonary exam normal        Cardiovascular Normal cardiovascular exam     Neuro/Psych  Headaches, PSYCHIATRIC DISORDERS Anxiety Bipolar Disorder    GI/Hepatic GERD  Medicated and Controlled,  Endo/Other    Renal/GU      Musculoskeletal   Abdominal   Peds  Hematology  (+) anemia ,   Anesthesia Other Findings Past Medical History: No date: Anemia No date: Anxiety No date: Bipolar disorder (HCC) No date: GERD (gastroesophageal reflux disease)     Comment:  occ No date: Headache     Comment:  migraines  Past Surgical History: 2013: aspiration of breast cyst; Right 10/02/2017: CESAREAN SECTION; N/A     Comment:  Procedure: CESAREAN SECTION;  Surgeon: Conard Novak, MD;  Location: [facility] ORS;  Service: Obstetrics;                Laterality: N/A; 2012: LAPAROSCOPIC APPENDECTOMY; N/A No date: WISDOM TOOTH EXTRACTION  BMI    Body Mass Index:  22.68 kg/m      Reproductive/Obstetrics                             Anesthesia Physical Anesthesia Plan  ASA: II  Anesthesia Plan: General ETT   Post-op Pain Management:    Induction: Intravenous  PONV Risk Score and Plan: 3 and Midazolam, Ondansetron and Dexamethasone  Airway Management Planned: Oral ETT  Additional Equipment:   Intra-op Plan:   Post-operative Plan: Extubation in OR  Informed Consent: I have reviewed the patients History and Physical, chart, labs and discussed the procedure including the risks, benefits and alternatives for the proposed anesthesia with the patient or  authorized representative who has indicated his/her understanding and acceptance.   Dental Advisory Given  Plan Discussed with: CRNA  Anesthesia Plan Comments:         Anesthesia Quick Evaluation

## 2017-11-18 NOTE — Anesthesia Postprocedure Evaluation (Signed)
Anesthesia Post Note  Patient: Adrienne West  Procedure(s) Performed: LAPAROSCOPIC PARTIAL SALPINGECTOMY (N/A )  Patient location during evaluation: PACU Anesthesia Type: General Level of consciousness: awake and alert and oriented Pain management: pain level controlled Vital Signs Assessment: post-procedure vital signs reviewed and stable Respiratory status: spontaneous breathing, nonlabored ventilation and respiratory function stable Cardiovascular status: blood pressure returned to baseline and stable Postop Assessment: no signs of nausea or vomiting Anesthetic complications: no     Last Vitals:  Vitals:   11/18/17 1534 11/18/17 1546  BP: 104/68 111/67  Pulse: 92 73  Resp: 16 16  Temp: (!) 36.2 C 36.5 C  SpO2: 99% 100%    Last Pain:  Vitals:   11/18/17 1546  TempSrc: Oral  PainSc: 3                  Bao Coreas

## 2017-11-18 NOTE — Interval H&P Note (Signed)
History and Physical Interval Note:  11/18/2017 1:17 PM  Adrienne West  has presented today for surgery, with the diagnosis of STERILIZATION  The various methods of treatment have been discussed with the patient and family. After consideration of risks, benefits and other options for treatment, the patient has consented to  Procedure(s): LAPAROSCOPIC PARTIAL SALPINGECTOMY (N/A) as a surgical intervention .  The patient's history has been reviewed, patient examined, no change in status, stable for surgery.  I have reviewed the patient's chart and labs.  Questions were answered to the patient's satisfaction.     Letitia Libra

## 2017-11-18 NOTE — Anesthesia Post-op Follow-up Note (Signed)
Anesthesia QCDR form completed.        

## 2017-11-18 NOTE — Op Note (Signed)
  Operative Note   11/18/2017  PRE-OP DIAGNOSIS: Desire for permanent sterilization  POST-OP DIAGNOSIS: same   PROCEDURE: Procedure(s): LAPAROSCOPIC PARTIAL SALPINGECTOMY  EXCISION STITCH GRANULOMA  SURGEON: Annamarie Major, MD, FACOG  ANESTHESIA: Choice   ESTIMATED BLOOD LOSS: Min  COMPLICATIONS: None  DISPOSITION: PACU - hemodynamically stable.  CONDITION: stable  FINDINGS: Laparoscopic survey of the abdomen revealed a grossly normal uterus, tubes, ovaries, liver edge, gallbladder edge and appendix, No intra-abdominal adhesions were noted. Stitch granuloma with palpable nodule in subcutaneous region was excised.  PROCEDURE IN DETAIL: The patient was taken to the OR where anesthesia was administed. The patient was positioned in supine position with foley placed.  Attention was turned to the patient's abdomen where a 5 mm skin incision was made in the umbilical fold, after injection of local anesthesia. The Veress step needle was carefully introduced into the peritoneal cavity with placement confirmed using the hanging drop technique.  Pneumoperitoneum was obtained. The 5 mm port was then placed under direct visualization with the operative laparoscope  The above noted findings.  Trendelenburg.  A 5 mm trocar was then placed in the right lower quadrant under direct visualization with the laparoscope. This was placed in the area of the palpable subcutaneous nodule with stitch granuloma.  This stitch (from rectus fascia from prior cesarean) is excised to removed nodule.  Return to laparoscopy:  Right and left fallopian tubes are identified and followed out to their fimbria.  Each tube is excised utilizing the Harmonic scapel to include the fibria.  No injuries or bleeding was noted.  All instruments and ports were then removed from the abdomen after gas was expelled and patient was leveled.   The skin was closed with skin adhesive. The patient tolerated the procedure well. All counts were  correct x 2. The patient was transferred to the recovery room awake, alert and breathing independently.  Annamarie Major, MD, [facility], [facility] 11/18/2017  2:55 PM

## 2017-11-19 ENCOUNTER — Encounter: Payer: Self-pay | Admitting: Obstetrics & Gynecology

## 2017-11-20 ENCOUNTER — Telehealth: Payer: Self-pay

## 2017-11-20 LAB — SURGICAL PATHOLOGY

## 2017-11-20 NOTE — Telephone Encounter (Signed)
-----   Message from Robert P Harris, MD sent at 11/20/2017  7:49 AM EDT ----- Regarding: post op2 Call and make sure she doing OK after surgery for tubal Tues 

## 2017-11-20 NOTE — Telephone Encounter (Signed)
-----   Message from Nadara Mustard, MD sent at 11/20/2017  7:49 AM EDT ----- Regarding: post op2 Call and make sure she doing OK after surgery for tubal Tues

## 2017-11-20 NOTE — Telephone Encounter (Signed)
Pt states she is a little sore for day 2. Pain with sneezing and coughing due to allergies. Overall she's ok. Will call if any problems or concerns

## 2017-11-28 ENCOUNTER — Ambulatory Visit: Payer: Medicaid Other | Admitting: Psychiatry

## 2017-12-03 ENCOUNTER — Encounter: Payer: Self-pay | Admitting: Obstetrics & Gynecology

## 2017-12-03 ENCOUNTER — Ambulatory Visit (INDEPENDENT_AMBULATORY_CARE_PROVIDER_SITE_OTHER): Payer: Medicaid Other | Admitting: Obstetrics & Gynecology

## 2017-12-03 VITALS — BP 120/70 | Ht 62.0 in | Wt 127.0 lb

## 2017-12-03 DIAGNOSIS — Z9851 Tubal ligation status: Secondary | ICD-10-CM

## 2017-12-03 MED ORDER — MEDROXYPROGESTERONE ACETATE 10 MG PO TABS: 20.0000 mg | ORAL_TABLET | Freq: Every day | ORAL | 2 refills | Status: DC

## 2017-12-03 NOTE — Progress Notes (Signed)
  [name] presents post op from lap tubal for requested sterilization, 2 weeks ago.  Subjective: Patient reports marked improvement in her preop symptoms. Eating a regular diet without difficulty. The patient is not having any pain.  Activity: normal activities of daily living. Patient reports vaginal sx's of None  Objective: LMP 11/18/2017  Physical Exam  Constitutional: She is oriented to person, place, and time. She appears well-developed and well-nourished. No distress.  Cardiovascular: Normal rate.  Pulmonary/Chest: Effort normal.  Abdominal: Soft. She exhibits no distension. There is no tenderness.  Incision Healing Well   Musculoskeletal: Normal range of motion.  Neurological: She is alert and oriented to person, place, and time. No cranial nerve deficit.  Skin: Skin is warm and dry.  Psychiatric: She has a normal mood and affect.    Assessment: s/p :  laparoscopy and tubal sterilization progressing well  Plan: Patient has done well after surgery with no apparent complications.  I have discussed the post-operative course to date, and the expected progress moving forward.  The patient understands what complications to be concerned about.  I will see the patient in routine follow up, or sooner if needed.    Still bleeding.  Received Depo 8 weeks ago PP.    Provera to help transition.    Expectations discussed.  Activity plan: No restriction.  Adrienne West 12/03/2017, 8:06 AM

## 2017-12-04 ENCOUNTER — Ambulatory Visit (INDEPENDENT_AMBULATORY_CARE_PROVIDER_SITE_OTHER): Payer: Medicaid Other | Admitting: Psychiatry

## 2017-12-04 ENCOUNTER — Encounter: Payer: Self-pay | Admitting: Psychiatry

## 2017-12-04 ENCOUNTER — Other Ambulatory Visit: Payer: Self-pay

## 2017-12-04 VITALS — BP 101/71 | HR 80 | Temp 98.8°F | Wt 128.0 lb

## 2017-12-04 DIAGNOSIS — F17200 Nicotine dependence, unspecified, uncomplicated: Secondary | ICD-10-CM | POA: Diagnosis not present

## 2017-12-04 DIAGNOSIS — F3181 Bipolar II disorder: Secondary | ICD-10-CM | POA: Diagnosis not present

## 2017-12-04 DIAGNOSIS — F410 Panic disorder [episodic paroxysmal anxiety] without agoraphobia: Secondary | ICD-10-CM

## 2017-12-04 DIAGNOSIS — F4312 Post-traumatic stress disorder, chronic: Secondary | ICD-10-CM | POA: Diagnosis not present

## 2017-12-04 MED ORDER — HYDROXYZINE HCL 25 MG PO TABS
25.0000 mg | ORAL_TABLET | Freq: Two times a day (BID) | ORAL | 0 refills | Status: DC | PRN
Start: 2017-12-04 — End: 2020-09-26

## 2017-12-04 MED ORDER — LAMOTRIGINE 25 MG PO TABS: 50.0000 mg | ORAL_TABLET | Freq: Every day | ORAL | 0 refills | Status: DC

## 2017-12-04 MED ORDER — SERTRALINE HCL 50 MG PO TABS: 50.0000 mg | ORAL_TABLET | Freq: Every day | ORAL | 0 refills | Status: DC

## 2017-12-04 NOTE — Progress Notes (Signed)
BH MD OP Progress Note  12/04/2017 5:12 PM Adrienne West  MRN:  [id number]  Chief Complaint: ' I am here for follow up." Chief Complaint    Follow-up; Medication Refill     HPI: Adrienne West is a 27 year old Caucasian female, unemployed, lives in [place], married, has a history of mood lability, anxiety attacks, presented to the clinic today for a follow-up visit.  Patient today presented along with her 3 children.  Patient reports she continues to have some panic symptoms on and off.  She has been using hydroxyzine 25 mg at least 3 times a week.  She however reports she may need a higher dose since she feels the 25 as not effective.  She also reports she is tolerating the Zoloft well however she has not been very compliant with it.  She reports that at times when she is so busy taking care of her children that she forgets to take the medication.  Patient reports sleep was better.  She does have a newborn to take care of however the baby wakes up only once at night and she is able to get at least 5 hours of rest at night.    Patient denies any suicidality.  Patient denies any perceptual disturbances.  Patient reports she has been trying to cut down on her smoking.  She declines any medications today for the same.  Patient reports she has seen therapist here in clinic once.  She however reports she may not be able to follow-up this clinic anymore due to having health insurance and financial issues.  She reports she is working on patient assistance program however if that is not going to be effective then she is planning to transfer to another clinic in the community.  Patient reports she would like to have at least 90-day supply of all her medications today while she makes the transition. Visit Diagnosis:    ICD-10-CM   1. Bipolar 2 disorder (HCC) F31.81   2. Panic attack F41.0   3. Tobacco use disorder F17.200   4. Chronic post-traumatic stress disorder (PTSD) F43.12     Past Psychiatric  History: Have reviewed past psychiatric history from my progress note on 10/29/2017.  Past trials of Lamictal, Lexapro, Klonopin.  Past Medical History:  Past Medical History:  Diagnosis Date  . Anemia   . Anxiety   . Bipolar disorder (HCC)   . GERD (gastroesophageal reflux disease)    occ  . Headache    migraines    Past Surgical History:  Procedure Laterality Date  . ABDOMINAL HYSTERECTOMY    . aspiration of breast cyst Right 2013  . CESAREAN SECTION N/A 10/02/2017   Procedure: CESAREAN SECTION;  Surgeon: Conard Novak, MD;  Location: [facility] ORS;  Service: Obstetrics;  Laterality: N/A;  . LAPAROSCOPIC APPENDECTOMY N/A 2012  . LAPAROSCOPIC BILATERAL SALPINGECTOMY N/A 11/18/2017   Procedure: LAPAROSCOPIC PARTIAL SALPINGECTOMY;  Surgeon: Nadara Mustard, MD;  Location: [facility] ORS;  Service: Gynecology;  Laterality: N/A;  . WISDOM TOOTH EXTRACTION      Family Psychiatric History: Have reviewed family psychiatric history from my progress note on 10/29/2017  Family History:  Family History  Problem Relation Age of Onset  . Bipolar disorder Mother   . Endometriosis Mother   . Breast cancer Mother 8  . Anxiety disorder Mother   . Depression Mother   . Stroke Father   . Hyperlipidemia Father   . Hypertension Father   . Bipolar disorder Sister   .  Anxiety disorder Sister   . Depression Sister   . COPD Maternal Grandmother   . Hypothyroidism Maternal Grandmother   . Diabetes Maternal Grandfather   . Rheum arthritis Paternal Grandmother   . AAA (abdominal aortic aneurysm) Paternal Grandfather   . Stroke Paternal Grandfather     Social History: Reviewed social history from my progress note on 10/29/2017. Social History   Socioeconomic History  . Marital status: Married    Spouse name: Gerilyn Pilgrim  . Number of children: 3  . Years of education: Not on file  . Highest education level: Some college, no degree  Occupational History  . Not on file  Social Needs  . Financial  resource strain: Very hard  . Food insecurity:    Worry: Often true    Inability: Often true  . Transportation needs:    Medical: No    Non-medical: No  Tobacco Use  . Smoking status: Current Every Day Smoker    Packs/day: 1.00    Years: 12.00    Pack years: 12.00    Types: Cigarettes  . Smokeless tobacco: Never Used  Substance and Sexual Activity  . Alcohol use: Yes    Comment: very rare  . Drug use: No  . Sexual activity: Not Currently  Lifestyle  . Physical activity:    Days per week: 0 days    Minutes per session: 0 min  . Stress: Not at all  Relationships  . Social connections:    Talks on phone: More than three times a week    Gets together: Once a week    Attends religious service: More than 4 times per year    Active member of club or organization: No    Attends meetings of clubs or organizations: Never    Relationship status: Married  Other Topics Concern  . Not on file  Social History Narrative  . Not on file    Allergies:  Allergies  Allergen Reactions  . Hydrocodone Itching  . Tegaderm Ag Mesh [Silver] Itching    Metabolic Disorder Labs: No results found for: HGBA1C, MPG No results found for: PROLACTIN No results found for: CHOL, TRIG, HDL, CHOLHDL, VLDL, LDLCALC No results found for: TSH  Therapeutic Level Labs: No results found for: LITHIUM No results found for: VALPROATE No components found for:  CBMZ  Current Medications: Current Outpatient Medications  Medication Sig Dispense Refill  . acetaminophen (TYLENOL) 500 MG tablet Take 1,000 mg by mouth every 6 (six) hours as needed (for headaches.).     Marland Kitchen cetirizine (ZYRTEC) 10 MG tablet Take 10 mg by mouth at bedtime.    . hydrOXYzine (ATARAX/VISTARIL) 25 MG tablet Take 1-2 tablets (25-50 mg total) by mouth 2 (two) times daily as needed for anxiety or itching (only for anxiety attacks severe). 180 tablet 0  . lamoTRIgine (LAMICTAL) 25 MG tablet Take 2 tablets (50 mg total) by mouth daily. 180  tablet 0  . medroxyPROGESTERone (PROVERA) 10 MG tablet Take 2 tablets (20 mg total) by mouth daily. 20 tablet 2  . Simethicone 125 MG CAPS Take 1 capsule by mouth as needed.    . sertraline (ZOLOFT) 50 MG tablet Take 1 tablet (50 mg total) by mouth daily. 90 tablet 0   No current facility-administered medications for this visit.      Musculoskeletal: Strength & Muscle Tone: within normal limits Gait & Station: normal Patient leans: N/A  Psychiatric Specialty Exam: Review of Systems  Psychiatric/Behavioral: The patient is nervous/anxious.   All  other systems reviewed and are negative.   Blood pressure 101/71, pulse 80, temperature 98.8 F (37.1 C), temperature source Oral, weight 128 lb (58.1 kg), last menstrual period 11/18/2017, not currently breastfeeding.Body mass index is 23.41 kg/m.  General Appearance: Casual  Eye Contact:  Fair  Speech:  Clear and Coherent  Volume:  Normal  Mood:  Anxious  Affect:  Appropriate  Thought Process:  Goal Directed and Descriptions of Associations: Intact  Orientation:  Full (Time, Place, and Person)  Thought Content: Logical   Suicidal Thoughts:  No  Homicidal Thoughts:  No  Memory:  Immediate;   Fair Recent;   Fair Remote;   Fair  Judgement:  Fair  Insight:  Fair  Psychomotor Activity:  Normal  Concentration:  Concentration: Fair and Attention Span: Fair  Recall:  [employer] of Knowledge: Fair  Language: Fair  Akathisia:  No  Handed:  Right  AIMS (if indicated): na  Assets:  Communication Skills Desire for Improvement Social Support  ADL's:  Intact  Cognition: WNL  Sleep:  varies since she has a new born   Screenings:   Assessment and Plan: Scarlettrose is a 27 year old Caucasian female who has a history of bipolar disorder, panic attacks, PTSD, presented to the clinic today for a follow-up visit.  Patient presented along with her 3 children.  Patient has a newborn baby who is 87 months old now.  Patient reports she has not been very  compliant with her medications however she is working on taking it daily now.  Patient continues to have some panic symptoms.  She has started psychotherapy with Ms. Peacock here in clinic.  Patient however reports she has financial/health insurance problems and has applied for patient assistance program. Discussed plan as noted below.  Bipolar disorder Continue Lamictal 50 mg p.o. daily Referred for CBT with Ms. Peacock, patient has seen Ms. Peacock once.  For panic attacks Increase Zoloft to 50 mg p.o. daily Increase hydroxyzine to 50 mg p.o. twice daily as needed.  Patient was started on hydroxyzine by her PMD for itching.  Patient however reports she currently takes it for anxiety attacks.  For PTSD Patient will benefit from psychotherapy.  Provided her information about clinics in the community.  Patient is also working on patient assistance program. Increase Zoloft to 50 mg p.o. daily  For tobacco use disorder Provided smoking cessation counseling. Patient is trying to quit.  More than 50 % of the time was spent for psychoeducation and supportive psychotherapy and care coordination.  This note was generated in part or whole with voice recognition software. Voice recognition is usually quite accurate but there are transcription errors that can and very often do occur. I apologize for any typographical errors that were not detected and corrected.       Jomarie Longs, MD 12/05/2017, 9:29 AM

## 2017-12-05 ENCOUNTER — Encounter: Payer: Self-pay | Admitting: Psychiatry

## 2017-12-16 ENCOUNTER — Ambulatory Visit: Payer: Medicaid Other | Admitting: Licensed Clinical Social Worker

## 2018-04-29 ENCOUNTER — Encounter: Payer: Self-pay | Admitting: Obstetrics & Gynecology

## 2018-04-29 ENCOUNTER — Ambulatory Visit (INDEPENDENT_AMBULATORY_CARE_PROVIDER_SITE_OTHER): Payer: Medicaid Other | Admitting: Obstetrics & Gynecology

## 2018-04-29 VITALS — BP 100/60 | Ht 62.0 in | Wt 122.0 lb

## 2018-04-29 DIAGNOSIS — N921 Excessive and frequent menstruation with irregular cycle: Secondary | ICD-10-CM | POA: Insufficient documentation

## 2018-04-29 MED ORDER — MEDROXYPROGESTERONE ACETATE 10 MG PO TABS: 10.0000 mg | ORAL_TABLET | Freq: Every day | ORAL | 3 refills | Status: DC

## 2018-04-29 NOTE — Patient Instructions (Signed)
Medroxyprogesterone tablets What is this medicine? MEDROXYPROGESTERONE (me DROX ee proe JES te rone) is a hormone in a class called progestins. It is commonly used to prevent the uterine lining from overgrowth in women taking an estrogen after menopause. It is also used to treat irregular menstrual bleeding or a lack of menstrual bleeding in women. This medicine may be used for other purposes; ask your health care provider or pharmacist if you have questions. COMMON BRAND NAME(S): Amen, Provera What should I tell my health care provider before I take this medicine? They need to know if you have any of these conditions: -blood vessel disease or a history of a blood clot in the lungs or legs -breast, cervical or vaginal cancer -heart disease -kidney disease -liver disease -migraine -recent miscarriage or abortion -mental depression -migraine -seizures (convulsions) -stroke -vaginal bleeding that has not been evaluated -an unusual or allergic reaction to medroxyprogesterone, other medicines, foods, dyes, or preservatives -pregnant or trying to get pregnant -breast-feeding How should I use this medicine? Take this medicine by mouth with a glass of water. Follow the directions on the prescription label. Take your doses at regular intervals. Do not take your medicine more often than directed. Talk to your pediatrician regarding the use of this medicine in children. Special care may be needed. While this drug may be prescribed for children as young as 13 years for selected conditions, precautions do apply. Overdosage: If you think you have taken too much of this medicine contact a poison control center or emergency room at once. NOTE: This medicine is only for you. Do not share this medicine with others. What if I miss a dose? If you miss a dose, take it as soon as you can. If it is almost time for your next dose, take only that dose. Do not take double or extra doses. What may interact with  this medicine? -barbiturate medicines for inducing sleep or treating seizures (convulsions) -bosentan -carbamazepine -phenytoin -rifampin -St. John's Wort This list may not describe all possible interactions. Give your health care provider a list of all the medicines, herbs, non-prescription drugs, or dietary supplements you use. Also tell them if you smoke, drink alcohol, or use illegal drugs. Some items may interact with your medicine. What should I watch for while using this medicine? Visit your health care professional for regular checks on your progress. You will need a regular breast and pelvic exam. If you have any reason to think you are pregnant, stop taking this medicine at once and contact your doctor or health care professional. What side effects may I notice from receiving this medicine? Side effects that you should report to your doctor or health care professional as soon as possible: -breast tenderness or discharge -changes in mood or emotions, such as depression -changes in vision or speech -pain in the abdomen, chest, groin, or leg -severe headache -skin rash, itching, or hives -sudden shortness of breath -unusually weak or tired -yellowing of skin or eyes Side effects that usually do not require medical attention (report to your doctor or health care professional if they continue or are bothersome): -acne -change in menstrual bleeding pattern or flow -changes in sexual desire -facial hair growth -fluid retention and swelling -headache -upset stomach -weight gain or loss This list may not describe all possible side effects. Call your doctor for medical advice about side effects. You may report side effects to FDA at 1-800-FDA-1088. Where should I keep my medicine? Keep out of the reach of children.   Store at room temperature between 20 and 25 degrees C (68 and 77 degrees F). Throw away any unused medicine after the expiration date. NOTE: This sheet is a summary. It  may not cover all possible information. If you have questions about this medicine, talk to your doctor, pharmacist, or health care provider.  2018 Elsevier/Gold Standard (2008-06-23 11:26:12)  

## 2018-04-29 NOTE — Progress Notes (Signed)
HPI:      Ms. Findley Blankenbaker is a 27 y.o. [phone] who LMP was Patient's last menstrual period was 04/04/2018., presents today for a problem visit.  She complains of menometrorrhagia that  began September after normal cycles in July and Aug (PP prior to that, had initial AUB 8 weeks after deliver treated well w Provera) and its severity is described as moderate.  She has been bleeding since early Sept some days heavier than others and they are associated with moderate menstrual cramping.  She has used the following for attempts at control: maxi pad and tampon.  Previous evaluation: none. Prior Diagnosis: dysfunctional uterine bleeding. Previous Treatment: Provera.  She is has sex with males.  Hx of STDs: none. She is premenopausal.  PMHx: She  has a past medical history of Anemia, Anxiety, Bipolar disorder (HCC), GERD (gastroesophageal reflux disease), and Headache. Also,  has a past surgical history that includes Laparoscopic appendectomy (N/A, 2012); aspiration of breast cyst (Right, 2013); Cesarean section (N/A, 10/02/2017); Wisdom tooth extraction; Laparoscopic bilateral salpingectomy (N/A, 11/18/2017); and Abdominal hysterectomy., family history includes AAA (abdominal aortic aneurysm) in her paternal grandfather; Anxiety disorder in her mother and sister; Bipolar disorder in her mother and sister; Breast cancer (age of onset: 83) in her mother; COPD in her maternal grandmother; Depression in her mother and sister; Diabetes in her maternal grandfather; Endometriosis in her mother; Hyperlipidemia in her father; Hypertension in her father; Hypothyroidism in her maternal grandmother; Rheum arthritis in her paternal grandmother; Stroke in her father and paternal grandfather.,  reports that she has been smoking cigarettes. She has a 12.00 pack-year smoking history. She has never used smokeless tobacco. She reports that she drinks alcohol. She reports that she does not use drugs.  She  Current  Outpatient Medications:  .  acetaminophen (TYLENOL) 500 MG tablet, Take 1,000 mg by mouth every 6 (six) hours as needed (for headaches.). , Disp: , Rfl:  .  cetirizine (ZYRTEC) 10 MG tablet, Take 10 mg by mouth at bedtime., Disp: , Rfl:  .  hydrOXYzine (ATARAX/VISTARIL) 25 MG tablet, Take 1-2 tablets (25-50 mg total) by mouth 2 (two) times daily as needed for anxiety or itching (only for anxiety attacks severe). (Patient not taking: Reported on 04/29/2018), Disp: 180 tablet, Rfl: 0 .  lamoTRIgine (LAMICTAL) 25 MG tablet, Take 2 tablets (50 mg total) by mouth daily. (Patient not taking: Reported on 04/29/2018), Disp: 180 tablet, Rfl: 0 .  medroxyPROGESTERone (PROVERA) 10 MG tablet, Take 1 tablet (10 mg total) by mouth daily., Disp: 10 tablet, Rfl: 3 .  sertraline (ZOLOFT) 50 MG tablet, Take 1 tablet (50 mg total) by mouth daily. (Patient not taking: Reported on 04/29/2018), Disp: 90 tablet, Rfl: 0 .  Simethicone 125 MG CAPS, Take 1 capsule by mouth as needed., Disp: , Rfl:   Also, is allergic to hydrocodone and tegaderm ag mesh [silver].  Review of Systems  Constitutional: Negative for chills, fever and malaise/fatigue.  HENT: Negative for congestion, sinus pain and sore throat.   Eyes: Negative for blurred vision and pain.  Respiratory: Negative for cough and wheezing.   Cardiovascular: Negative for chest pain and leg swelling.  Gastrointestinal: Negative for abdominal pain, constipation, diarrhea, heartburn, nausea and vomiting.  Genitourinary: Negative for dysuria, frequency, hematuria and urgency.  Musculoskeletal: Negative for back pain, joint pain, myalgias and neck pain.  Skin: Negative for itching and rash.  Neurological: Negative for dizziness, tremors and weakness.  Endo/Heme/Allergies: Does not bruise/bleed easily.  Psychiatric/Behavioral: Negative  for depression. The patient is not nervous/anxious and does not have insomnia.     Objective: BP 100/60   Ht 5\' 2"  (1.575 m)   Wt  122 lb (55.3 kg)   LMP 04/04/2018   BMI 22.31 kg/m  Physical Exam  Constitutional: She is oriented to person, place, and time. She appears well-developed and well-nourished. No distress.  Musculoskeletal: Normal range of motion.  Neurological: She is alert and oriented to person, place, and time.  Skin: Skin is warm and dry.  Psychiatric: She has a normal mood and affect.  Vitals reviewed.   ASSESSMENT/PLAN:   dysfunctional uterine bleeding with menometrorrhagia  Patient has abnormal uterine bleeding . She has a normal exam today, with no evidence of lesions.  Evaluation includes the following: exam, labs such as hormonal testing, and pelvic ultrasound to evaluate for any structural gynecologic abnormalities.  Patient to follow up after testing.  Pt declines [place] as she is self pay today.  As she is post partum and unlikely any new anatomic defects, will treat w Provera and if successful will hold [place] for now.  Treatment option for menorrhagia or menometrorrhagia discussed in great detail with the patient.  Options include hormonal therapy, IUD therapy such as Mirena, D&C, Ablation, and Hysterectomy.  The pros and cons of each option discussed with patient.  S/p BTL.  Provera when symptomatic, vs Depo Provera discussed, as she did well with Depo before and would like this only if she has recurrent menometrorrhagia  A total of 15 minutes were spent face-to-face with the patient during this encounter and over half of that time dealt with counseling and coordination of care.   Annamarie Major, MD, [facility], [facility] 04/29/2018  2:25 PM

## 2018-12-08 ENCOUNTER — Other Ambulatory Visit: Payer: Self-pay

## 2018-12-08 ENCOUNTER — Encounter: Payer: Self-pay | Admitting: Obstetrics and Gynecology

## 2018-12-08 ENCOUNTER — Ambulatory Visit (INDEPENDENT_AMBULATORY_CARE_PROVIDER_SITE_OTHER): Payer: Medicaid Other | Admitting: Obstetrics and Gynecology

## 2018-12-08 VITALS — BP 122/74 | Ht 62.0 in | Wt 127.0 lb

## 2018-12-08 DIAGNOSIS — Z0000 Encounter for general adult medical examination without abnormal findings: Secondary | ICD-10-CM

## 2018-12-08 DIAGNOSIS — Z01419 Encounter for gynecological examination (general) (routine) without abnormal findings: Secondary | ICD-10-CM

## 2018-12-08 DIAGNOSIS — Z1331 Encounter for screening for depression: Secondary | ICD-10-CM

## 2018-12-08 DIAGNOSIS — Z1339 Encounter for screening examination for other mental health and behavioral disorders: Secondary | ICD-10-CM

## 2018-12-08 NOTE — Progress Notes (Signed)
Gynecology Annual Exam  PCP: Conard NovakJackson, Carmine Carrozza D, MD  Chief Complaint  Patient presents with  . Annual Exam    History of Present Illness:  [name] is a 28 y.o. [phone] who LMP was Patient's last menstrual period was 11/27/2018., presents today for her annual examination.  Her menses are regular every 28-30 days, lasting 6 day(s).  Dysmenorrhea mild, occurring first 1-2 days of flow. She does not have intermenstrual bleeding.  She is sexually active. No problems with intercourse.  .  Last Pap: 11/2017  Results were: no abnormalities /neg HPV DNA not done Hx of STDs: none  There is no FH of breast cancer. There is no FH of ovarian cancer. The patient does do self-breast exams.  Tobacco use: currently smokes about < 1 ppd Alcohol use: social drinker Exercise: nothing formal  The patient wears seatbelts: yes.   The patient reports that domestic violence in her life is absent.   Past Medical History:  Diagnosis Date  . Anemia   . Anxiety   . Bipolar disorder (HCC)   . GERD (gastroesophageal reflux disease)    occ  . Headache    migraines    Past Surgical History:  Procedure Laterality Date  . aspiration of breast cyst Right 2013  . CESAREAN SECTION N/A 10/02/2017   Procedure: CESAREAN SECTION;  Surgeon: Conard NovakJackson, Harshith Pursell D, MD;  Location: [facility] ORS;  Service: Obstetrics;  Laterality: N/A;  . LAPAROSCOPIC APPENDECTOMY N/A 2012  . LAPAROSCOPIC BILATERAL SALPINGECTOMY N/A 11/18/2017   Procedure: LAPAROSCOPIC PARTIAL SALPINGECTOMY;  Surgeon: Nadara MustardHarris, Robert P, MD;  Location: [facility] OR[facility];  Service: Gynecology;  Laterality: N/A;  . TUBAL LIGATION    . WISDOM TOOTH EXTRACTION      Prior to Admission medications   Medication Sig Start Date End Date Taking? Authorizing Provider  acetaminophen (TYLENOL) 500 MG tablet Take 1,000 mg by mouth every 6 (six) hours as needed (for headaches.).     [provider]  cetirizine (ZYRTEC) 10 MG tablet Take 10 mg by mouth at  bedtime.    [provider]  hydrOXYzine (ATARAX/VISTARIL) 25 MG tablet Take 1-2 tablets (25-50 mg total) by mouth 2 (two) times daily as needed for anxiety or itching (only for anxiety attacks severe). Patient not taking: Reported on 04/29/2018 12/04/17   Jomarie LongsEappen, Saramma, MD  lamoTRIgine (LAMICTAL) 25 MG tablet Take 2 tablets (50 mg total) by mouth daily. Patient not taking: Reported on 04/29/2018 12/04/17   Jomarie LongsEappen, Saramma, MD  medroxyPROGESTERone (PROVERA) 10 MG tablet Take 1 tablet (10 mg total) by mouth daily. Patient not taking: Reported on 12/08/2018 04/29/18   Nadara MustardHarris, Robert P, MD  sertraline (ZOLOFT) 50 MG tablet Take 1 tablet (50 mg total) by mouth daily. Patient not taking: Reported on 04/29/2018 12/04/17   Jomarie LongsEappen, Saramma, MD  Simethicone 125 MG CAPS Take 1 capsule by mouth as needed.    [provider]    Allergies  Allergen Reactions  . Hydrocodone Itching  . Tegaderm Ag Mesh [Silver] Itching   Obstetric History: [id number]  Social History   Socioeconomic History  . Marital status: Married    Spouse name: Gerilyn Pilgrimjacob  . Number of children: 3  . Years of education: Not on file  . Highest education level: Some college, no degree  Occupational History  . Not on file  Social Needs  . Financial resource strain: Very hard  . Food insecurity:    Worry: Often true    Inability: Often true  .  Transportation needs:    Medical: No    Non-medical: No  Tobacco Use  . Smoking status: Current Every Day Smoker    Packs/day: 1.00    Years: 12.00    Pack years: 12.00    Types: Cigarettes  . Smokeless tobacco: Never Used  Substance and Sexual Activity  . Alcohol use: Yes    Comment: very rare  . Drug use: No  . Sexual activity: Yes    Birth control/protection: Surgical  Lifestyle  . Physical activity:    Days per week: 0 days    Minutes per session: 0 min  . Stress: Not at all  Relationships  . Social connections:    Talks on phone: More than three times a week     Gets together: Once a week    Attends religious service: More than 4 times per year    Active member of club or organization: No    Attends meetings of clubs or organizations: Never    Relationship status: Married  . Intimate partner violence:    Fear of current or ex partner: No    Emotionally abused: No    Physically abused: No    Forced sexual activity: No  Other Topics Concern  . Not on file  Social History Narrative  . Not on file    Family History  Problem Relation Age of Onset  . Bipolar disorder Mother   . Endometriosis Mother   . Breast cancer Mother 18  . Anxiety disorder Mother   . Depression Mother   . Stroke Father   . Hyperlipidemia Father   . Hypertension Father   . Bipolar disorder Sister   . Anxiety disorder Sister   . Depression Sister   . COPD Maternal Grandmother   . Hypothyroidism Maternal Grandmother   . Diabetes Maternal Grandfather   . Rheum arthritis Paternal Grandmother   . AAA (abdominal aortic aneurysm) Paternal Grandfather   . Stroke Paternal Grandfather     Review of Systems  Constitutional: Negative.   HENT: Negative.   Eyes: Negative.   Respiratory: Negative.   Cardiovascular: Negative.   Gastrointestinal: Negative.   Genitourinary: Negative.   Musculoskeletal: Negative.   Skin: Negative.   Neurological: Negative.   Psychiatric/Behavioral: Negative.      Physical Exam BP 122/74   Ht  (1.575 m)   Wt 127 lb (57.6 kg)   LMP 11/27/2018   BMI 23.23 kg/m    Physical Exam Constitutional:      General: She is not in acute distress.    Appearance: Normal appearance. She is well-developed.  Genitourinary:     Pelvic exam was performed with patient in the lithotomy position.     Vulva, urethra, bladder and uterus normal.     No inguinal adenopathy present in the right or left side.    No signs of injury in the vagina.     No vaginal discharge, erythema, tenderness or bleeding.     No cervical motion tenderness,  discharge, lesion or polyp.     Uterus is mobile.     Uterus is not enlarged or tender.     No uterine mass detected.    Uterus is anteverted.     No right or left adnexal mass present.     Right adnexa not tender or full.     Left adnexa not tender or full.  HENT:     Head: Normocephalic and atraumatic.  Eyes:     General: No  scleral icterus.    Conjunctiva/sclera: Conjunctivae normal.  Neck:     Musculoskeletal: Normal range of motion and neck supple.     Thyroid: No thyromegaly.  Cardiovascular:     Rate and Rhythm: Normal rate and regular rhythm.     Heart sounds: No murmur. No friction rub. No gallop.   Pulmonary:     Effort: Pulmonary effort is normal. No respiratory distress.     Breath sounds: Normal breath sounds. No wheezing or rales.  Chest:     Breasts:        Right: No inverted nipple, mass, nipple discharge, skin change or tenderness.        Left: No inverted nipple, mass, nipple discharge, skin change or tenderness.  Abdominal:     General: Bowel sounds are normal. There is no distension.     Palpations: Abdomen is soft. There is no mass.     Tenderness: There is no abdominal tenderness. There is no guarding or rebound.  Musculoskeletal: Normal range of motion.        General: No swelling or tenderness.  Lymphadenopathy:     Cervical: No cervical adenopathy.     Upper Body:     Right upper body: No axillary adenopathy.     Left upper body: No axillary adenopathy.     Lower Body: No right inguinal adenopathy. No left inguinal adenopathy.  Neurological:     General: No focal deficit present.     Mental Status: She is alert and oriented to person, place, and time.     Cranial Nerves: No cranial nerve deficit.  Skin:    General: Skin is warm and dry.     Findings: No erythema or rash.  Psychiatric:        Mood and Affect: Mood normal.        Behavior: Behavior normal.        Judgment: Judgment normal.     Female chaperone present for pelvic and breast   portions of the physical exam  Results: AUDIT Questionnaire (screen for alcoholism): 3 PHQ-9: 1   Assessment: 28 y.o. [phone] female here for routine annual gynecologic examination  Plan: Problem List Items Addressed This Visit    None    Visit Diagnoses    Women's annual routine gynecological examination    -  Primary   Screening for depression       Screening for alcoholism          Screening: -- Blood pressure screen normal -- Weight screening: normal -- Depression screening negative (PHQ-9) -- Nutrition: normal -- cholesterol screening: not due for screening -- osteoporosis screening: not due -- tobacco screening: using: discussed quitting using the 5 A's -- alcohol screening: AUDIT questionnaire indicates low-risk usage. -- family history of breast cancer screening: done. not at high risk. -- no evidence of domestic violence or intimate partner violence. -- STD screening: gonorrhea/chlamydia NAAT not collected per patient request. -- pap smear not collected per ASCCP guidelines -- HPV vaccination series: believes she received  Thomasene Mohair, MD 12/08/2018 4:09 PM

## 2019-11-19 ENCOUNTER — Other Ambulatory Visit: Payer: Self-pay

## 2019-11-19 ENCOUNTER — Encounter: Payer: Self-pay | Admitting: Emergency Medicine

## 2019-11-19 DIAGNOSIS — R0989 Other specified symptoms and signs involving the circulatory and respiratory systems: Secondary | ICD-10-CM | POA: Insufficient documentation

## 2019-11-19 DIAGNOSIS — F17210 Nicotine dependence, cigarettes, uncomplicated: Secondary | ICD-10-CM | POA: Insufficient documentation

## 2019-11-19 DIAGNOSIS — Z79899 Other long term (current) drug therapy: Secondary | ICD-10-CM | POA: Insufficient documentation

## 2019-11-19 NOTE — ED Triage Notes (Signed)
Patient states that Tuesday after eating she has felt like she has food lodged in her throat. Patient states that she is still able to eat and swallow but that the lump feels larger.

## 2019-11-20 ENCOUNTER — Emergency Department: Payer: Self-pay

## 2019-11-20 ENCOUNTER — Emergency Department
Admission: EM | Admit: 2019-11-20 | Discharge: 2019-11-20 | Disposition: A | Discharge status: Home / Self Care | Payer: Self-pay | Attending: Emergency Medicine | Admitting: Emergency Medicine

## 2019-11-20 DIAGNOSIS — R0989 Other specified symptoms and signs involving the circulatory and respiratory systems: Secondary | ICD-10-CM

## 2019-11-20 LAB — BASIC METABOLIC PANEL
Anion gap: 7 (ref 5–15)
BUN: 10 mg/dL (ref 6–20)
CO2: 27 mmol/L (ref 22–32)
Calcium: 9.4 mg/dL (ref 8.9–10.3)
Chloride: 106 mmol/L (ref 98–111)
Creatinine, Ser: 0.95 mg/dL (ref 0.44–1.00)
GFR calc Af Amer: >60 mL/min (ref >60)
GFR calc non Af Amer: >60 mL/min (ref >60)
Glucose, Bld: 103 mg/dL — ABNORMAL HIGH (ref 70–99)
Potassium: 3.4 mmol/L — ABNORMAL LOW (ref 3.5–5.1)
Sodium: 140 mmol/L (ref 135–145)

## 2019-11-20 LAB — CBC WITH DIFFERENTIAL/PLATELET
Abs Immature Granulocytes: 0.01 10*3/uL (ref 0.00–0.07)
Basophils Absolute: 0.1 10*3/uL (ref 0.0–0.1)
Basophils Relative: 1 %
Eosinophils Absolute: 0.1 10*3/uL (ref 0.0–0.5)
Eosinophils Relative: 1 %
HCT: 37.1 % (ref 36.0–46.0)
Hemoglobin: 12.4 g/dL (ref 12.0–15.0)
Immature Granulocytes: 0 %
Lymphocytes Relative: 39 %
Lymphs Abs: 3.5 10*3/uL (ref 0.7–4.0)
MCH: 32.7 pg (ref 26.0–34.0)
MCHC: 33.4 g/dL (ref 30.0–36.0)
MCV: 97.9 fL (ref 80.0–100.0)
Monocytes Absolute: 0.5 10*3/uL (ref 0.1–1.0)
Monocytes Relative: 5 %
Neutro Abs: 4.8 10*3/uL (ref 1.7–7.7)
Neutrophils Relative %: 54 %
Platelets: 306 10*3/uL (ref 150–400)
RBC: 3.79 MIL/uL — ABNORMAL LOW (ref 3.87–5.11)
RDW: 12.7 % (ref 11.5–15.5)
WBC: 9 10*3/uL (ref 4.0–10.5)
nRBC: 0 % (ref 0.0–0.2)

## 2019-11-20 MED ORDER — IOHEXOL 300 MG/ML  SOLN
75.0000 mL | Freq: Once | INTRAMUSCULAR | Status: AC | PRN
  Administered 2019-11-20: 75 mL via INTRAVENOUS

## 2019-11-20 MED ORDER — FAMOTIDINE 20 MG PO TABS
20.0000 mg | ORAL_TABLET | Freq: Two times a day (BID) | ORAL | 0 refills | Status: DC
Start: 2019-11-20 — End: 2020-09-26

## 2019-11-20 MED ORDER — ALUM & MAG HYDROXIDE-SIMETH 200-200-20 MG/5ML PO SUSP
30.0000 mL | Freq: Once | ORAL | Status: AC
  Administered 2019-11-20: 30 mL via ORAL
  Filled 2019-11-20: qty 30

## 2019-11-20 MED ORDER — LIDOCAINE VISCOUS HCL 2 % MT SOLN
15.0000 mL | Freq: Once | OROMUCOSAL | Status: AC
  Administered 2019-11-20: 15 mL via ORAL
  Filled 2019-11-20: qty 15

## 2019-11-20 MED ORDER — SUCRALFATE 1 GM/10ML PO SUSP
1.0000 g | Freq: Three times a day (TID) | ORAL | 0 refills | Status: DC
Start: 2019-11-20 — End: 2020-09-26

## 2019-11-20 MED ORDER — DEXAMETHASONE SODIUM PHOSPHATE 10 MG/ML IJ SOLN
10.0000 mg | Freq: Once | INTRAMUSCULAR | Status: AC
  Administered 2019-11-20: 10 mg via INTRAVENOUS
  Filled 2019-11-20: qty 1

## 2019-11-20 NOTE — ED Notes (Signed)
Pt denies any difficulty with swallowing or breathing. No notable swelling noted no the anterior or lateral sides of the neck. Pt st it feels like theres a lump in her throat that causes her to have acid reflux of stomach contents

## 2019-11-20 NOTE — ED Provider Notes (Addendum)
[facility] Emergency Department Provider Note  ____________________________________________   First MD Initiated Contact with Patient 11/20/19 0215     (approximate)  I have reviewed the triage vital signs and the nursing notes.   HISTORY  Chief Complaint Foreign Body    HPI Adrienne West is a 29 y.o. female  With h/o bipolar d/o, GERD, migraines, here with neck pain. Pt reports that starting around Tuesday, she developed a foreign body sensation like something is stuck in her lower throat. She's had associated difficulty swallowing along with feeling like something is stuck. This has progressively worsening and she now has difficulty even swallowing secretions. She also feels like food/secretions are coming "back up" after swallowing. No vomiting. No abd pain. No h/o similar issues in the past. She has been taking and trying antacids since it happened w/o relief.        Past Medical History:  Diagnosis Date  . Anemia   . Anxiety   . Bipolar disorder (HCC)   . GERD (gastroesophageal reflux disease)    occ  . Headache    migraines    Patient Active Problem List   Diagnosis Date Noted  . Menometrorrhagia 04/29/2018  . Bipolar disorder (HCC) 04/17/2017    Past Surgical History:  Procedure Laterality Date  . aspiration of breast cyst Right 2013  . CESAREAN SECTION N/A 10/02/2017   Procedure: CESAREAN SECTION;  Surgeon: Conard Novak, MD;  Location: [facility] ORS;  Service: Obstetrics;  Laterality: N/A;  . LAPAROSCOPIC APPENDECTOMY N/A 2012  . LAPAROSCOPIC BILATERAL SALPINGECTOMY N/A 11/18/2017   Procedure: LAPAROSCOPIC PARTIAL SALPINGECTOMY;  Surgeon: Nadara Mustard, MD;  Location: [facility] ORS;  Service: Gynecology;  Laterality: N/A;  . TUBAL LIGATION    . WISDOM TOOTH EXTRACTION      Prior to Admission medications   Medication Sig Start Date End Date Taking? Authorizing Provider  acetaminophen (TYLENOL) 500 MG tablet Take 1,000 mg by  mouth every 6 (six) hours as needed (for headaches.).     [provider]  cetirizine (ZYRTEC) 10 MG tablet Take 10 mg by mouth at bedtime.    [provider]  hydrOXYzine (ATARAX/VISTARIL) 25 MG tablet Take 1-2 tablets (25-50 mg total) by mouth 2 (two) times daily as needed for anxiety or itching (only for anxiety attacks severe). Patient not taking: Reported on 04/29/2018 12/04/17   Jomarie Longs, MD  lamoTRIgine (LAMICTAL) 25 MG tablet Take 2 tablets (50 mg total) by mouth daily. Patient not taking: Reported on 04/29/2018 12/04/17   Jomarie Longs, MD  medroxyPROGESTERone (PROVERA) 10 MG tablet Take 1 tablet (10 mg total) by mouth daily. Patient not taking: Reported on 12/08/2018 04/29/18   Nadara Mustard, MD  sertraline (ZOLOFT) 50 MG tablet Take 1 tablet (50 mg total) by mouth daily. Patient not taking: Reported on 04/29/2018 12/04/17   Jomarie Longs, MD  Simethicone 125 MG CAPS Take 1 capsule by mouth as needed.    [provider]    Allergies Hydrocodone and Tegaderm ag mesh [silver]  Family History  Problem Relation Age of Onset  . Bipolar disorder Mother   . Endometriosis Mother   . Breast cancer Mother 60  . Anxiety disorder Mother   . Depression Mother   . Stroke Father   . Hyperlipidemia Father   . Hypertension Father   . Bipolar disorder Sister   . Anxiety disorder Sister   . Depression Sister   . COPD Maternal Grandmother   . Hypothyroidism  Maternal Grandmother   . Diabetes Maternal Grandfather   . Rheum arthritis Paternal Grandmother   . AAA (abdominal aortic aneurysm) Paternal Grandfather   . Stroke Paternal Grandfather     Social History Social History   Tobacco Use  . Smoking status: Current Every Day Smoker    Packs/day: 1.00    Years: 12.00    Pack years: 12.00    Types: Cigarettes  . Smokeless tobacco: Never Used  Substance Use Topics  . Alcohol use: Yes    Comment: very rare  . Drug use: No    Review of Systems    Review of Systems  Constitutional: Negative for fatigue and fever.  HENT: Positive for trouble swallowing. Negative for congestion and sore throat.   Eyes: Negative for visual disturbance.  Respiratory: Negative for cough and shortness of breath.   Cardiovascular: Negative for chest pain.  Gastrointestinal: Negative for abdominal pain, diarrhea, nausea and vomiting.  Genitourinary: Negative for flank pain.  Musculoskeletal: Positive for neck pain. Negative for back pain.  Skin: Negative for rash and wound.  Neurological: Negative for weakness.  All other systems reviewed and are negative.    ____________________________________________  PHYSICAL EXAM:      VITAL SIGNS: ED Triage Vitals [11/19/19 2040]  Enc Vitals Group     BP 107/67     Pulse Rate 66     Resp 18     Temp 98.5 F (36.9 C)     Temp Source Oral     SpO2 100 %     Weight 115 lb (52.2 kg)     Height 5\' 3"  (1.6 m)     Head Circumference      Peak Flow      Pain Score 0     Pain Loc      Pain Edu?      Excl. in GC?      Physical Exam Vitals and nursing note reviewed.  Constitutional:      General: She is not in acute distress.    Appearance: She is well-developed.  HENT:     Head: Normocephalic and atraumatic.     Mouth/Throat:     Mouth: Mucous membranes are moist.  Eyes:     Conjunctiva/sclera: Conjunctivae normal.  Neck:     Comments: Supple, ROM is normal. Slight TTP along lower anterior neck but no crepitance, no deformity. No palpable FB. No tracheal deviation. No bruit or hematomas noted. Cardiovascular:     Rate and Rhythm: Normal rate and regular rhythm.     Heart sounds: Normal heart sounds. No murmur. No friction rub.  Pulmonary:     Effort: Pulmonary effort is normal. No respiratory distress.     Breath sounds: Normal breath sounds. No wheezing or rales.  Abdominal:     General: There is no distension.     Palpations: Abdomen is soft.     Tenderness: There is no abdominal  tenderness.  Musculoskeletal:     Cervical back: Neck supple.  Skin:    General: Skin is warm.     Capillary Refill: Capillary refill takes less than 2 seconds.  Neurological:     Mental Status: She is alert and oriented to person, place, and time.     Motor: No abnormal muscle tone.       ____________________________________________   LABS (all labs ordered are listed, but only abnormal results are displayed)  Labs Reviewed  CBC WITH DIFFERENTIAL/PLATELET - Abnormal; Notable for the following components:  Result Value   RBC 3.79 (*)    All other components within normal limits  BASIC METABOLIC PANEL - Abnormal; Notable for the following components:   Potassium 3.4 (*)    Glucose, Bld 103 (*)    All other components within normal limits  POC URINE PREG, ED    ____________________________________________  EKG: None ________________________________________  RADIOLOGY All imaging, including plain films, CT scans, and ultrasounds, independently reviewed by me, and interpretations confirmed via formal radiology reads.  ED MD interpretation:   XR Neck: Negative CT Neck: neg, no abnormalities  Official radiology report(s): DG Neck Soft Tissue  Result Date: 11/20/2019 CLINICAL DATA:  Neck pain EXAM: NECK SOFT TISSUES - 1+ VIEW COMPARISON:  None. FINDINGS: There is no evidence of retropharyngeal soft tissue swelling or epiglottic enlargement. The cervical airway is unremarkable and no radio-opaque foreign body identified. IMPRESSION: Negative. Electronically Signed   By: Jasmine Pang M.D.   On: 11/20/2019 03:01   CT Soft Tissue Neck W Contrast  Result Date: 11/20/2019 CLINICAL DATA:  Dysphagia EXAM: CT NECK WITH CONTRAST TECHNIQUE: Multidetector CT imaging of the neck was performed using the standard protocol following the bolus administration of intravenous contrast. CONTRAST:  29mL OMNIPAQUE IOHEXOL 300 MG/ML  SOLN COMPARISON:  None. FINDINGS: PHARYNX AND LARYNX: The  nasopharynx, oropharynx and larynx are normal. Visible portions of the oral cavity, tongue base and floor of mouth are normal. Normal epiglottis, vallecula and pyriform sinuses. The larynx is normal. No retropharyngeal abscess, effusion or lymphadenopathy. SALIVARY GLANDS: Normal parotid, submandibular and sublingual glands. THYROID: Normal. LYMPH NODES: No enlarged or abnormal density lymph nodes. VASCULAR: Major cervical vessels are patent. LIMITED INTRACRANIAL: Normal. VISUALIZED ORBITS: Normal. MASTOIDS AND VISUALIZED PARANASAL SINUSES: No fluid levels or advanced mucosal thickening. No mastoid effusion. SKELETON: No bony spinal canal stenosis. No lytic or blastic lesions. UPPER CHEST: Clear. OTHER: None. IMPRESSION: Normal CT of the neck. Electronically Signed   By: Deatra Robinson M.D.   On: 11/20/2019 04:25    ____________________________________________  PROCEDURES   Procedure(s) performed (including Critical Care):  Procedures  ____________________________________________  INITIAL IMPRESSION / MDM / ASSESSMENT AND PLAN / ED COURSE  As part of my medical decision making, I reviewed the following data within the electronic MEDICAL RECORD NUMBER Nursing notes reviewed and incorporated, Old chart reviewed, Notes from prior ED visits, and [place] Controlled Substance Database       *Zeffie Bickert was evaluated in Emergency Department on 11/20/2019 for the symptoms described in the history of present illness. She was evaluated in the context of the global COVID-19 pandemic, which necessitated consideration that the patient might be at risk for infection with the SARS-CoV-2 virus that causes COVID-19. Institutional protocols and algorithms that pertain to the evaluation of patients at risk for COVID-19 are in a state of rapid change based on information released by regulatory bodies including the CDC and federal and state organizations. These policies and algorithms were followed during the patient's  care in the ED.  Some ED evaluations and interventions may be delayed as a result of limited staffing during the pandemic.*     Medical Decision Making:  29 yo F here with globus sensation after eating fish. Pt is afebrile, well appearing without signs to suggest mediastinitis or perforated esophagus. No evidence of aspiration. Labs reassuring with normal WBC. Given severity of her sx, however, plain films and CT obtained which are fortunately negative. Will treat with decadron and viscous lidocaine, tx as outpt with antacids, viscous  lido PRN, and outpt follow-up if needed.  ____________________________________________  FINAL CLINICAL IMPRESSION(S) / ED DIAGNOSES  Final diagnoses:  Globus sensation     MEDICATIONS GIVEN DURING THIS VISIT:  Medications  alum & mag hydroxide-simeth (MAALOX/MYLANTA) 200-200-20 MG/5ML suspension 30 mL (has no administration in time range)    And  lidocaine (XYLOCAINE) 2 % viscous mouth solution 15 mL (has no administration in time range)  dexamethasone (DECADRON) injection 10 mg (has no administration in time range)  iohexol (OMNIPAQUE) 300 MG/ML solution 75 mL (75 mLs Intravenous Contrast Given 11/20/19 7939)     ED Discharge Orders    None       Note:  This document was prepared using Dragon voice recognition software and may include unintentional dictation errors.   Duffy Bruce, MD 11/20/19 0300    Duffy Bruce, MD 11/20/19 9233    Duffy Bruce, MD 11/20/19 [phone]

## 2020-01-17 ENCOUNTER — Encounter: Payer: Self-pay | Admitting: Obstetrics and Gynecology

## 2020-01-17 ENCOUNTER — Ambulatory Visit (INDEPENDENT_AMBULATORY_CARE_PROVIDER_SITE_OTHER): Payer: Medicaid Other | Admitting: Obstetrics and Gynecology

## 2020-01-17 ENCOUNTER — Other Ambulatory Visit: Payer: Self-pay

## 2020-01-17 VITALS — BP 122/74 | Wt 128.0 lb

## 2020-01-17 DIAGNOSIS — N930 Postcoital and contact bleeding: Secondary | ICD-10-CM

## 2020-01-17 NOTE — Progress Notes (Signed)
Obstetrics & Gynecology Office Visit   Chief Complaint  Patient presents with  . Vaginal Bleeding  . Pelvic Pain    History of Present Illness: 29 y.o. G75P2103 female who presents who noted a gush of blood after intercourse. She noted that the bleeding continued for 3-5 minutes. She noted that the blood was on him, too.  Eventually, the bleeding stopped.  She has had to use liners since then. She has had cramping and pain in her cervix area and even radiating to her lower back.  Her last period ended a week prior to this episode.  The bleeding episode started 3 days ago.  She uses BTL for contraception.  She states that intercourse wasn't terribly uncomfortable.  However, they utilized different positions and sometimes there was some discomfort that made her want to change positions.  She has intermittent spotting today. The discomfort is like menstrual cramps. She rates the pain 3/10, occasionally spiking to a 5/10.  The change in pain is related to her changing positions. Resting helps the pain calm down. She denies any other symptoms unless noted above.   Past Medical History:  Diagnosis Date  . Anemia   . Anxiety   . Bipolar disorder (HCC)   . GERD (gastroesophageal reflux disease)    occ  . Headache    migraines   Past Surgical History:  Procedure Laterality Date  . aspiration of breast cyst Right 2013  . CESAREAN SECTION N/A 10/02/2017   Procedure: CESAREAN SECTION;  Surgeon: Conard Novak, MD;  Location: [facility] ORS;  Service: Obstetrics;  Laterality: N/A;  . LAPAROSCOPIC APPENDECTOMY N/A 2012  . LAPAROSCOPIC BILATERAL SALPINGECTOMY N/A 11/18/2017   Procedure: LAPAROSCOPIC PARTIAL SALPINGECTOMY;  Surgeon: Nadara Mustard, MD;  Location: [facility] ORS;  Service: Gynecology;  Laterality: N/A;  . TUBAL LIGATION    . WISDOM TOOTH EXTRACTION      Gynecologic History: No LMP recorded.  Obstetric History: [id number]  Family History  Problem Relation Age of Onset  . Bipolar disorder  Mother   . Endometriosis Mother   . Breast cancer Mother 24  . Anxiety disorder Mother   . Depression Mother   . Stroke Father   . Hyperlipidemia Father   . Hypertension Father   . Bipolar disorder Sister   . Anxiety disorder Sister   . Depression Sister   . COPD Maternal Grandmother   . Hypothyroidism Maternal Grandmother   . Diabetes Maternal Grandfather   . Rheum arthritis Paternal Grandmother   . AAA (abdominal aortic aneurysm) Paternal Grandfather   . Stroke Paternal Grandfather     Social History   Socioeconomic History  . Marital status: Married    Spouse name: Gerilyn Pilgrim  . Number of children: 3  . Years of education: Not on file  . Highest education level: Some college, no degree  Occupational History  . Not on file  Tobacco Use  . Smoking status: Current Every Day Smoker    Packs/day: 1.00    Years: 12.00    Pack years: 12.00    Types: Cigarettes  . Smokeless tobacco: Never Used  Vaping Use  . Vaping Use: Never used  Substance and Sexual Activity  . Alcohol use: Yes    Comment: very rare  . Drug use: No  . Sexual activity: Yes    Birth control/protection: Surgical  Other Topics Concern  . Not on file  Social History Narrative  . Not on file   Social Determinants of Health   Financial  Resource Strain:   . Difficulty of Paying Living Expenses:   Food Insecurity:   . Worried About Programme researcher, broadcasting/film/video in the Last Year:   . Barista in the Last Year:   Transportation Needs:   . Freight forwarder (Medical):   Marland Kitchen Lack of Transportation (Non-Medical):   Physical Activity:   . Days of Exercise per Week:   . Minutes of Exercise per Session:   Stress:   . Feeling of Stress :   Social Connections:   . Frequency of Communication with Friends and Family:   . Frequency of Social Gatherings with Friends and Family:   . Attends Religious Services:   . Active Member of Clubs or Organizations:   . Attends Banker Meetings:   Marland Kitchen Marital  Status:   Intimate Partner Violence:   . Fear of Current or Ex-Partner:   . Emotionally Abused:   Marland Kitchen Physically Abused:   . Sexually Abused:     Allergies  Allergen Reactions  . Hydrocodone Itching  . Tegaderm Ag Mesh [Silver] Itching    Prior to Admission medications: denies    Review of Systems  Constitutional: Negative.   HENT: Negative.   Eyes: Negative.   Respiratory: Negative.   Cardiovascular: Negative.   Gastrointestinal: Negative.   Genitourinary: Negative.        See HPI  Musculoskeletal: Negative.   Skin: Negative.   Neurological: Negative.   Psychiatric/Behavioral: Negative.      Physical Exam BP 122/74   Wt 128 lb (58.1 kg)   BMI 22.67 kg/m  No LMP recorded. Physical Exam Constitutional:      General: She is not in acute distress.    Appearance: Normal appearance. She is well-developed.  Genitourinary:     Pelvic exam was performed with patient in the lithotomy position.     Vulva, inguinal canal, urethra, bladder, vagina and right adnexa normal.     No posterior fourchette tenderness, injury or lesion present.     No lesions in the vagina.     No vaginal discharge, tenderness, bleeding or ulceration.     Cervical motion tenderness (mild) present.     No cervical discharge, friability, lesion, bleeding or polyp.     Uterus is tender (mild).     No right or left adnexal mass present.     Right adnexa not tender or full.     Left adnexa tender.     Left adnexa not full.  HENT:     Head: Normocephalic and atraumatic.  Eyes:     General: No scleral icterus.    Conjunctiva/sclera: Conjunctivae normal.  Cardiovascular:     Rate and Rhythm: Normal rate and regular rhythm.     Heart sounds: No murmur heard.  No friction rub. No gallop.   Pulmonary:     Effort: Pulmonary effort is normal. No respiratory distress.     Breath sounds: Normal breath sounds. No wheezing or rales.  Abdominal:     General: Bowel sounds are normal. There is no distension.      Palpations: Abdomen is soft. There is no mass.     Tenderness: There is no abdominal tenderness. There is no guarding or rebound.  Musculoskeletal:        General: Normal range of motion.     Cervical back: Normal range of motion and neck supple.  Neurological:     General: No focal deficit present.     Mental Status: She  is alert and oriented to person, place, and time.     Cranial Nerves: No cranial nerve deficit.  Skin:    General: Skin is warm and dry.     Findings: No erythema.  Psychiatric:        Mood and Affect: Mood normal.        Behavior: Behavior normal.        Judgment: Judgment normal.     Female chaperone present for pelvic and breast  portions of the physical exam  Assessment: 29 y.o. A6T0160 female here for  1. Postcoital bleeding      Plan: Problem List Items Addressed This Visit    None    Visit Diagnoses    Postcoital bleeding    -  Primary   Relevant Orders   US PELVIS TRANSVAGINAL NON-OB (TV ONLY)     No obvious etiology to her bleeding.  Will obtain pelvic u/s.  Declines STD screening, even with CMT.    A total of 20 minutes were spent face-to-face with the patient as well as preparation, review, communication, and documentation during this encounter.    Thomasene Mohair, MD 01/17/2020 11:48 AM

## 2020-01-21 IMAGING — US US OB LIMITED
1 series · 14 of 23 positions shown · non-contrast
Comparison: none

CLINICAL DATA: Oligohydramnios. Last menstrual period 32 weeks, 6
days.

EXAM:
LIMITED OBSTETRIC ULTRASOUND

[Series 1: us ob limited · 14 of 23 slices shown]
[im 1/23]
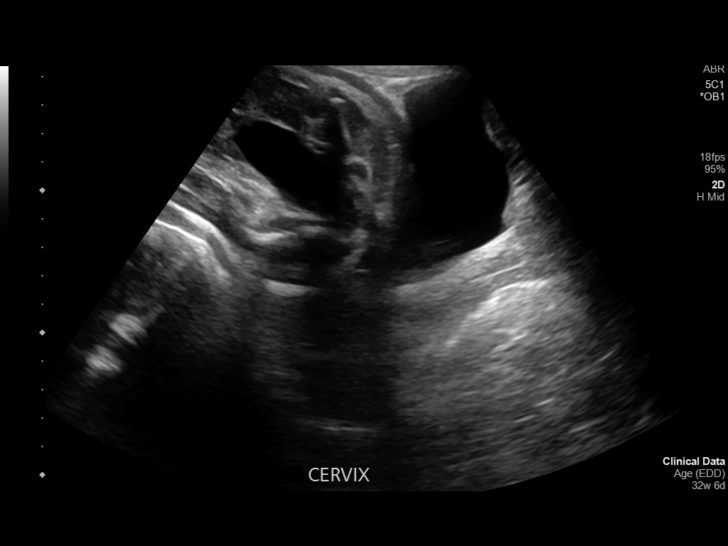
[im 3/23]
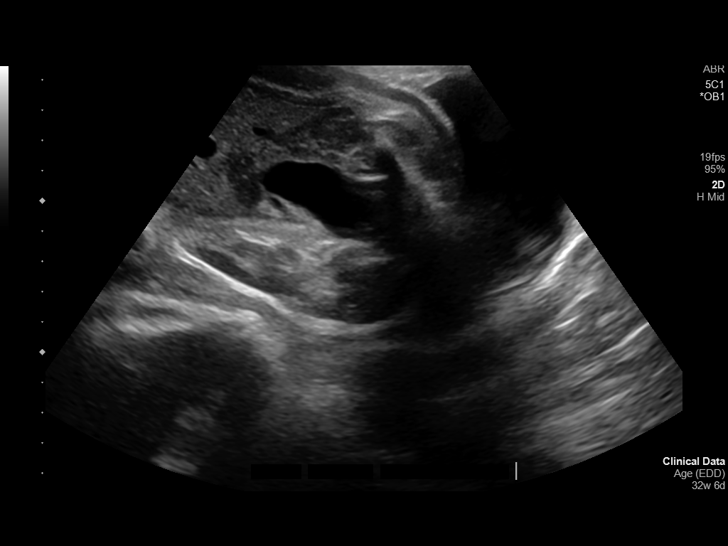
[im 5/23]
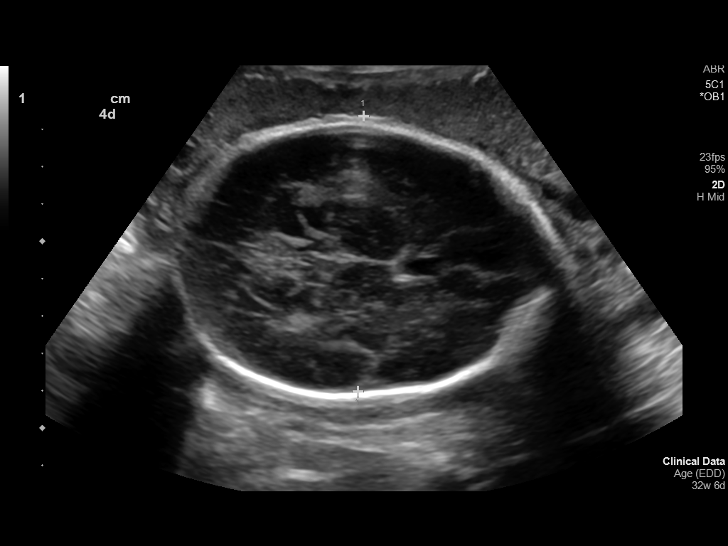
[im 6/23]
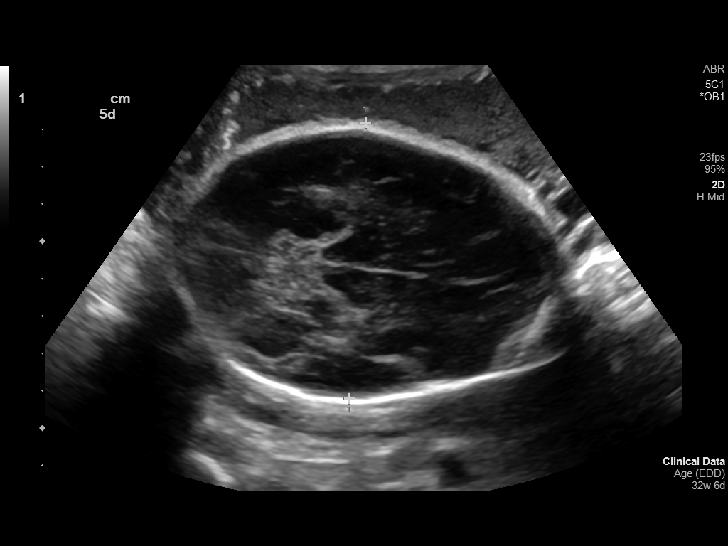
[im 8/23]
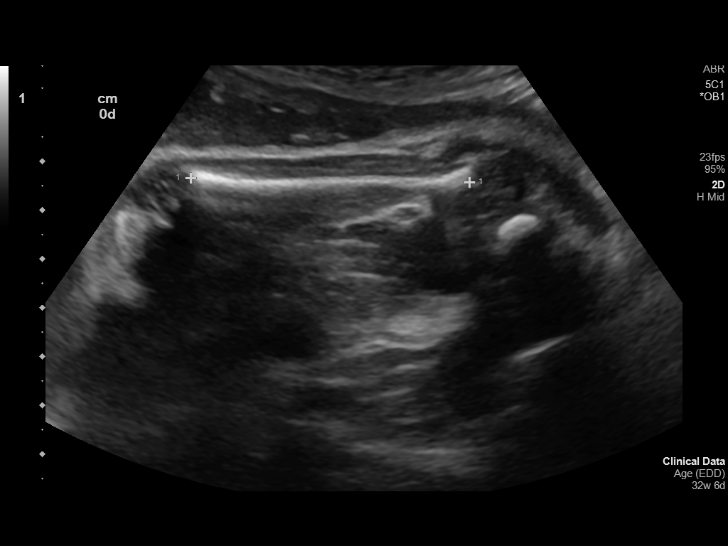
[im 10/23]
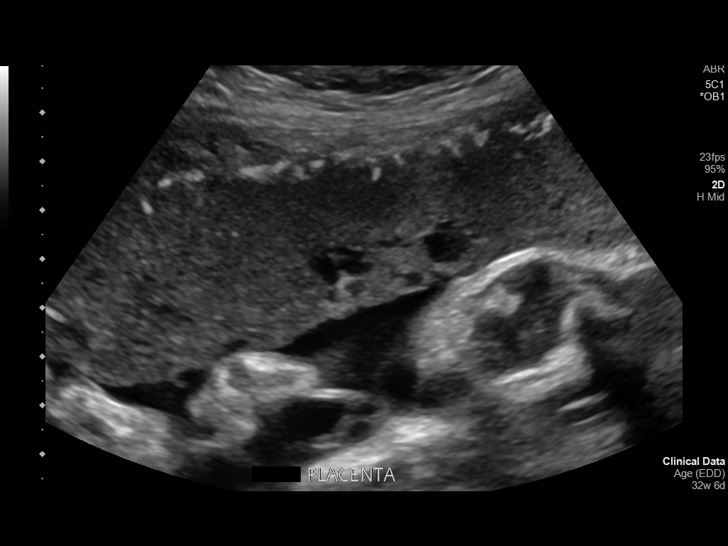
[im 11/23]
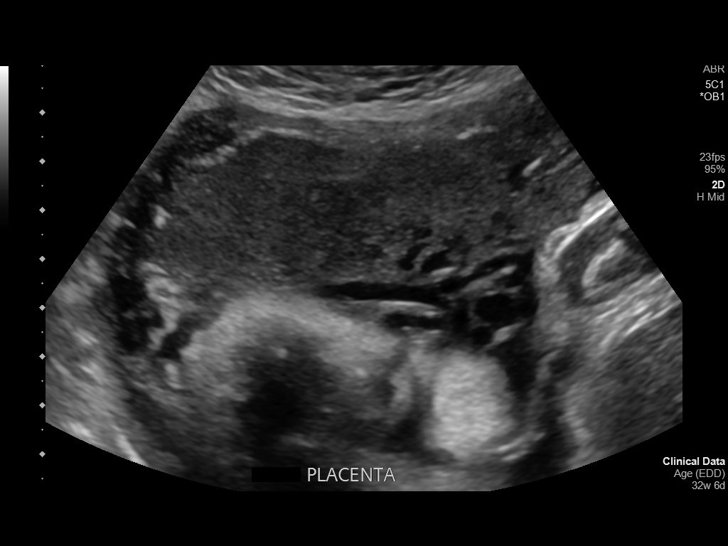
[im 13/23]
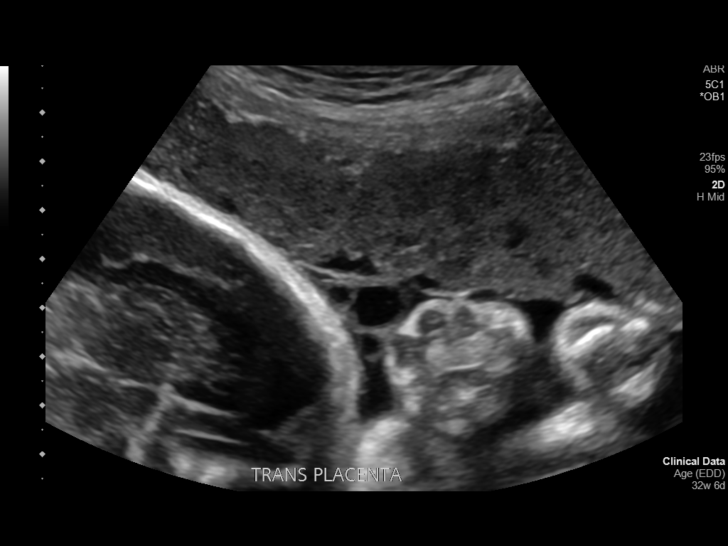
[im 14/23]
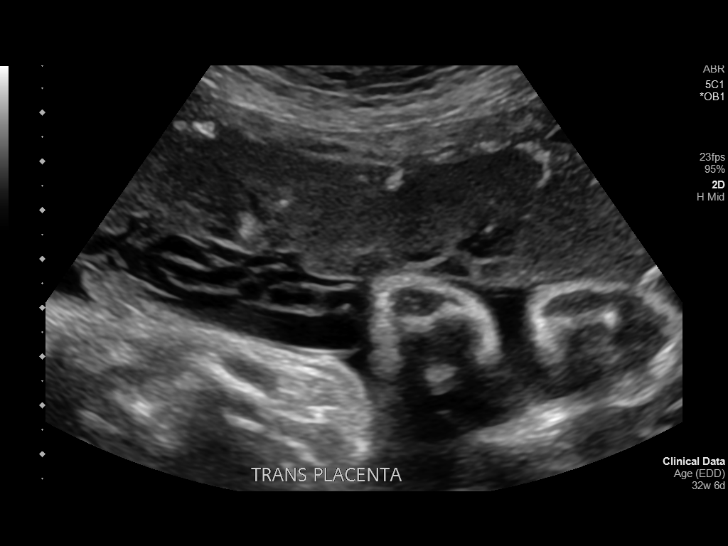
[im 16/23]
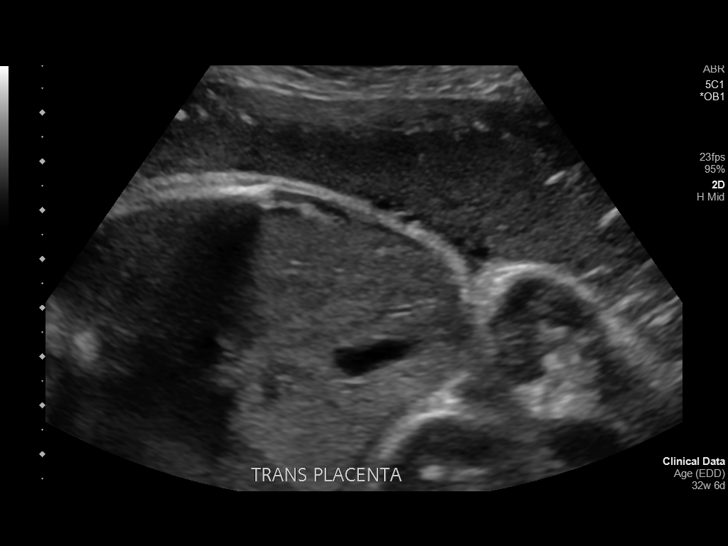
[im 18/23]
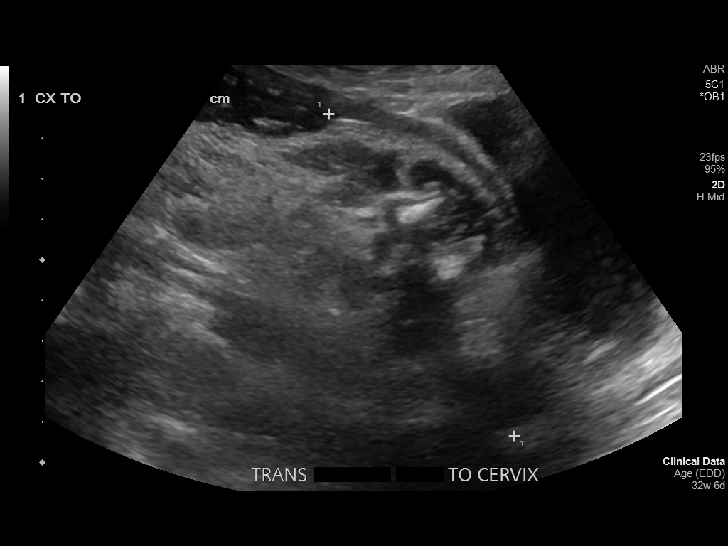
[im 19/23]
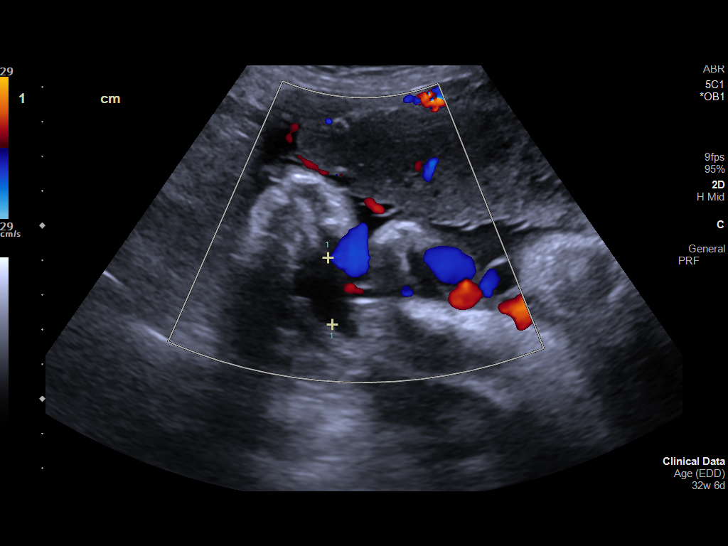
[im 21/23]
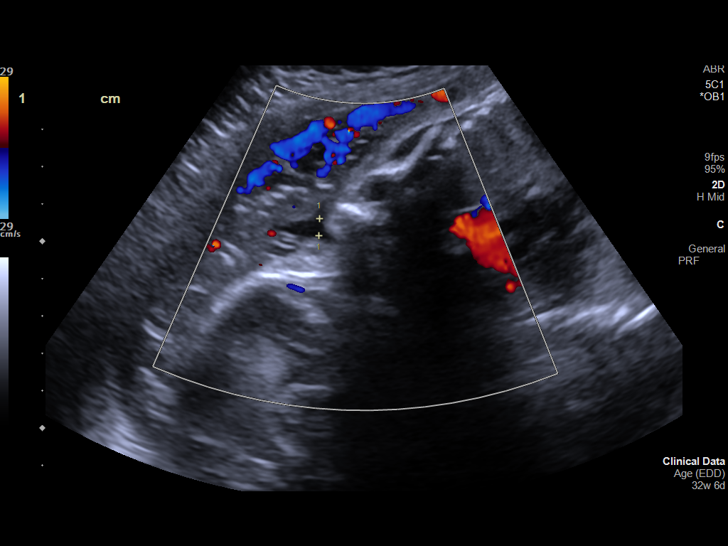
[im 23/23]
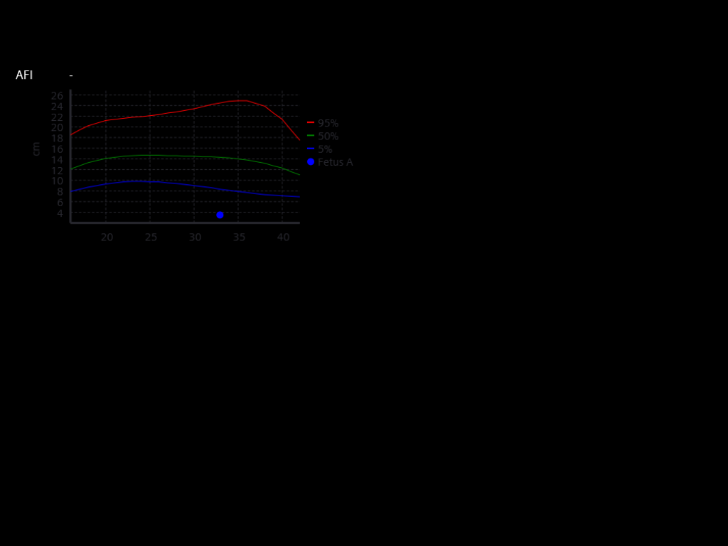

[14 of 23 positions shown; findings below may reference images not displayed]

FINDINGS: Number of Fetuses: 1

Heart Rate:  140 bpm

Movement: Visualized.

Presentation: Breech

Placental Location: Anterior

Previa: None

Amniotic Fluid (Subjective):  Subjectively decreased.  AFI is 3.5.

BPD: 7.39 cm 29 w  5 d

FL: 5.72 cm 30 w 0 d.

MATERNAL FINDINGS:

Cervix:  Appears closed.

Uterus/Adnexae: No abnormality visualized.
IMPRESSION: Oligohydramnios.  AFI is 3.5 cm.  5th percentile is 8.6 cm.

Fetus measures small for dates as described above.

Breech presentation.

This exam is performed on an emergent basis and does not
comprehensively evaluate fetal size, dating, or anatomy; follow-up
complete OB US should be considered if further fetal assessment is
warranted.

## 2020-01-28 ENCOUNTER — Ambulatory Visit: Payer: Medicaid Other | Admitting: Obstetrics and Gynecology

## 2020-01-28 ENCOUNTER — Ambulatory Visit: Payer: Medicaid Other

## 2020-02-25 ENCOUNTER — Encounter: Payer: Self-pay | Admitting: Obstetrics and Gynecology

## 2020-02-25 ENCOUNTER — Ambulatory Visit (INDEPENDENT_AMBULATORY_CARE_PROVIDER_SITE_OTHER): Payer: Self-pay

## 2020-02-25 ENCOUNTER — Ambulatory Visit (INDEPENDENT_AMBULATORY_CARE_PROVIDER_SITE_OTHER): Payer: Self-pay | Admitting: Obstetrics and Gynecology

## 2020-02-25 ENCOUNTER — Other Ambulatory Visit: Payer: Self-pay

## 2020-02-25 VITALS — BP 110/80 | Ht 62.0 in | Wt 123.0 lb

## 2020-02-25 DIAGNOSIS — N930 Postcoital and contact bleeding: Secondary | ICD-10-CM
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
## 2020-02-28 LAB — NUSWAB VAGINITIS PLUS (VG+)
Candida albicans, NAA: NEGATIVE
Candida glabrata, NAA: NEGATIVE
Chlamydia trachomatis, NAA: NEGATIVE
Neisseria gonorrhoeae, NAA: NEGATIVE
Trich vag by NAA: NEGATIVE

## 2020-06-07 ENCOUNTER — Ambulatory Visit
Admission: EM | Admit: 2020-06-07 | Discharge: 2020-06-07 | Disposition: A | Discharge status: Home / Self Care | Payer: BC Managed Care – PPO | Attending: Physician Assistant | Admitting: Physician Assistant

## 2020-06-07 ENCOUNTER — Other Ambulatory Visit: Payer: Self-pay

## 2020-06-07 DIAGNOSIS — G43001 Migraine without aura, not intractable, with status migrainosus: Secondary | ICD-10-CM | POA: Diagnosis not present

## 2020-06-07 DIAGNOSIS — M542 Cervicalgia: Secondary | ICD-10-CM

## 2020-06-07 MED ORDER — ONDANSETRON 4 MG PO TBDP: 4.0000 mg | ORAL_TABLET | Freq: Four times a day (QID) | ORAL | 0 refills | Status: AC | PRN

## 2020-06-07 MED ORDER — TIZANIDINE HCL 4 MG PO TABS: 4.0000 mg | ORAL_TABLET | Freq: Two times a day (BID) | ORAL | 0 refills | Status: AC | PRN

## 2020-06-07 MED ORDER — ONDANSETRON 4 MG PO TBDP
4.0000 mg | ORAL_TABLET | Freq: Once | ORAL | Status: AC
  Administered 2020-06-07: 4 mg via ORAL

## 2020-06-07 MED ORDER — KETOROLAC TROMETHAMINE 10 MG PO TABS: 10.0000 mg | ORAL_TABLET | Freq: Four times a day (QID) | ORAL | 0 refills | Status: AC | PRN

## 2020-06-07 MED ORDER — KETOROLAC TROMETHAMINE 60 MG/2ML IM SOLN
60.0000 mg | Freq: Once | INTRAMUSCULAR | Status: AC
  Administered 2020-06-07: 60 mg via INTRAMUSCULAR

## 2020-06-07 NOTE — ED Provider Notes (Signed)
[facility]    CSN: [id number] Arrival date & time: 06/07/20  1102      History   Chief Complaint Chief Complaint  Patient presents with  . Headache    HPI Adrienne West is a 29 y.o. female   presenting for having a continuous headache for the past 4 days.  Patient states that she started to notice pain of the left frontal temporal region that then spread to the other side and eventually spread down both sides of her neck.  Patient states that the headache pain is consistently about 6 out of 10.  She occasionally will have nausea associated with it.  She says she has some sensitivity to sunlight as well.  Denies any noise sensitivity.  She denies any nausea or vomiting.  She says she has seen flashes of light at times.  Denies any loss of vision or blurred vision.  No syncope.  Admits to history of migraines, but says that the headache that she is currently having is different and worse.  She says she has had a seen a neurologist in the past and did take Topamax at one point.  She says she also had Imitrex.  Patient says she eventually was able to come off of the Topamax and did not have any headaches or migraines for couple years.  She says that over the past couple of years she is had about 10-12 headache/migraine days a month.  For her current headache, she says she has tried over-the-counter ibuprofen and Tylenol without relief.  She denies any recent illness.  Denies any sore throat, sinus pain, nasal congestion, ear pain or pressure.  No known Covid exposure.  Patient fully vaccinated for Covid.  No other concerns today.  HPI  Past Medical History:  Diagnosis Date  . Anemia   . Anxiety   . Bipolar disorder (HCC)   . GERD (gastroesophageal reflux disease)    occ  . Headache    migraines    Patient Active Problem List   Diagnosis Date Noted  . Menometrorrhagia 04/29/2018  . Bipolar disorder (HCC) 04/17/2017    Past Surgical History:  Procedure Laterality  Date  . APPENDECTOMY    . aspiration of breast cyst Right 2013  . CESAREAN SECTION N/A 10/02/2017   Procedure: CESAREAN SECTION;  Surgeon: Conard Novak, MD;  Location: [facility] ORS;  Service: Obstetrics;  Laterality: N/A;  . LAPAROSCOPIC APPENDECTOMY N/A 2012  . LAPAROSCOPIC BILATERAL SALPINGECTOMY N/A 11/18/2017   Procedure: LAPAROSCOPIC PARTIAL SALPINGECTOMY;  Surgeon: Nadara Mustard, MD;  Location: [facility] ORS;  Service: Gynecology;  Laterality: N/A;  . TUBAL LIGATION    . WISDOM TOOTH EXTRACTION      OB History    Gravida  3   Para  3   Term  2   Preterm  1   AB  0   Living  3     SAB  0   TAB  0   Ectopic  0   Multiple  0   Live Births  3            Home Medications    Prior to Admission medications   Medication Sig Start Date End Date Taking? Authorizing Provider  acetaminophen (TYLENOL) 500 MG tablet Take 1,000 mg by mouth every 6 (six) hours as needed (for headaches.).  Patient not taking: Reported on 02/25/2020    [provider]  cetirizine (ZYRTEC) 10 MG tablet Take 10 mg by mouth at  bedtime. Patient not taking: Reported on 02/25/2020    [provider]  famotidine (PEPCID) 20 MG tablet Take 1 tablet (20 mg total) by mouth 2 (two) times daily for 5 days. 11/20/19 11/25/19  Shaune Pollack, MD  hydrOXYzine (ATARAX/VISTARIL) 25 MG tablet Take 1-2 tablets (25-50 mg total) by mouth 2 (two) times daily as needed for anxiety or itching (only for anxiety attacks severe). Patient not taking: Reported on 04/29/2018 12/04/17   Jomarie Longs, MD  ketorolac (TORADOL) 10 MG tablet Take 1 tablet (10 mg total) by mouth every 6 (six) hours as needed for up to 5 days (headache). 06/07/20 06/12/20  Shirlee Latch, PA-C  lamoTRIgine (LAMICTAL) 25 MG tablet Take 2 tablets (50 mg total) by mouth daily. Patient not taking: Reported on 04/29/2018 12/04/17   Jomarie Longs, MD  medroxyPROGESTERone (PROVERA) 10 MG tablet Take 1 tablet (10 mg total) by mouth  daily. Patient not taking: Reported on 12/08/2018 04/29/18   Nadara Mustard, MD  ondansetron (ZOFRAN ODT) 4 MG disintegrating tablet Take 1 tablet (4 mg total) by mouth every 6 (six) hours as needed for up to 5 days for nausea or vomiting. 06/07/20 06/12/20  Eusebio Friendly B, PA-C  sertraline (ZOLOFT) 50 MG tablet Take 1 tablet (50 mg total) by mouth daily. Patient not taking: Reported on 04/29/2018 12/04/17   Jomarie Longs, MD  Simethicone 125 MG CAPS Take 1 capsule by mouth as needed. Patient not taking: Reported on 02/25/2020    [provider]  sucralfate (CARAFATE) 1 GM/10ML suspension Take 10 mLs (1 g total) by mouth 4 (four) times daily -  with meals and at bedtime for 5 days. 11/20/19 11/25/19  Shaune Pollack, MD  tiZANidine (ZANAFLEX) 4 MG tablet Take 1 tablet (4 mg total) by mouth every 12 (twelve) hours as needed for up to 5 days for muscle spasms. 06/07/20 06/12/20  Shirlee Latch, PA-C    Family History Family History  Problem Relation Age of Onset  . Bipolar disorder Mother   . Endometriosis Mother   . Breast cancer Mother 6  . Anxiety disorder Mother   . Depression Mother   . Stroke Father   . Hyperlipidemia Father   . Hypertension Father   . Bipolar disorder Sister   . Anxiety disorder Sister   . Depression Sister   . COPD Maternal Grandmother   . Hypothyroidism Maternal Grandmother   . Diabetes Maternal Grandfather   . Rheum arthritis Paternal Grandmother   . AAA (abdominal aortic aneurysm) Paternal Grandfather   . Stroke Paternal Grandfather     Social History Social History   Tobacco Use  . Smoking status: Current Every Day Smoker    Packs/day: 1.00    Years: 12.00    Pack years: 12.00    Types: Cigarettes  . Smokeless tobacco: Never Used  Vaping Use  . Vaping Use: Never used  Substance Use Topics  . Alcohol use: Yes    Comment: very rare  . Drug use: No     Allergies   Hydrocodone and Tegaderm ag mesh [silver]   Review of  Systems Review of Systems  Constitutional: Negative for chills, diaphoresis, fatigue and fever.  HENT: Negative for congestion, ear pain, rhinorrhea, sinus pressure, sinus pain and sore throat.   Eyes: Positive for photophobia. Negative for visual disturbance.  Respiratory: Negative for cough and shortness of breath.   Gastrointestinal: Positive for nausea. Negative for abdominal pain and vomiting.  Musculoskeletal: Negative for arthralgias and myalgias.  Skin: Negative for rash.  Neurological: Positive for headaches. Negative for dizziness, syncope and weakness.  Hematological: Negative for adenopathy.     Physical Exam Triage Vital Signs ED Triage Vitals [06/07/20 1115]  Enc Vitals Group     BP 120/82     Pulse Rate 76     Resp 16     Temp 98.9 F (37.2 C)     Temp Source Oral     SpO2 100 %     Weight 118 lb (53.5 kg)     Height 5\' 2"  (1.575 m)     Head Circumference      Peak Flow      Pain Score 6     Pain Loc      Pain Edu?      Excl. in GC?    No data found.  Updated Vital Signs BP 120/82   Pulse 76   Temp 98.9 F (37.2 C) (Oral)   Resp 16   Ht 5\' 2"  (1.575 m)   Wt 118 lb (53.5 kg)   LMP 05/11/2020   SpO2 100%   BMI 21.58 kg/m       Physical Exam Vitals and nursing note reviewed.  Constitutional:      General: She is not in acute distress.    Appearance: Normal appearance. She is not ill-appearing or toxic-appearing.  HENT:     Head: Normocephalic and atraumatic.     Right Ear: Tympanic membrane, ear canal and external ear normal.     Left Ear: Tympanic membrane, ear canal and external ear normal.     Nose: Nose normal.     Mouth/Throat:     Mouth: Mucous membranes are moist.     Pharynx: Oropharynx is clear.  Eyes:     General: No scleral icterus.       Right eye: No discharge.        Left eye: No discharge.     Extraocular Movements: Extraocular movements intact.     Conjunctiva/sclera: Conjunctivae normal.     Pupils: Pupils are equal,  round, and reactive to light.  Cardiovascular:     Rate and Rhythm: Normal rate and regular rhythm.     Heart sounds: Normal heart sounds.  Pulmonary:     Effort: Pulmonary effort is normal. No respiratory distress.     Breath sounds: Normal breath sounds.  Musculoskeletal:     Cervical back: Neck supple. Tenderness (mild/moderate TTP bilateral trapezius regions) present.  Skin:    General: Skin is dry.  Neurological:     General: No focal deficit present.     Mental Status: She is alert and oriented to person, place, and time. Mental status is at baseline.     Cranial Nerves: No cranial nerve deficit.     Sensory: No sensory deficit.     Motor: No weakness.     Gait: Gait normal.  Psychiatric:        Mood and Affect: Mood normal.        Behavior: Behavior normal.        Thought Content: Thought content normal.      UC Treatments / Results  Labs (all labs ordered are listed, but only abnormal results are displayed) Labs Reviewed - No data to display  EKG   Radiology No results found.  Procedures Procedures (including critical care time)  Medications Ordered in UC Medications  ketorolac (TORADOL) injection 60 mg (60 mg Intramuscular Given 06/07/20 1208)  ondansetron (ZOFRAN-ODT) disintegrating  tablet 4 mg (4 mg Oral Given 06/07/20 1208)    Initial Impression / Assessment and Plan / UC Course  I have reviewed the triage vital signs and the nursing notes.  Pertinent labs & imaging results that were available during my care of the patient were reviewed by me and considered in my medical decision making (see chart for details).    29 year old female presenting for headache for the past 4 days.  She does present with her 2 small children.  She admits to occasional nausea and light sensitivity.  No other symptoms.  Her neurological exam is normal.  She does have some tenderness of the trapezius region bilaterally.  Visual acuity obtained on patient says she says a strong  times when she has her had her eyes checked. Vision 20/20.  Patient given 60 mg IM ketorolac in clinic and 4 mg ODT Zofran.  Prescribed ketorolac for patient's headache/migraine.  Advised increasing rest and fluids.  Advised she can take Tylenol 1 g up to 3 times a day as needed for headache relief as well.  I have sent Zofran for her to take with the ketorolac since she says that sometimes that medication makes her nauseous.  I have sent tizanidine as well to help with the neck pain which seems to be consistent with muscle tension and spasms.  Advised her to reach out to her PCP as she may need a referral back to her neurologist for management of her headaches.  ED precautions discussed with patient.   Final Clinical Impressions(s) / UC Diagnoses   Final diagnoses:  Migraine without aura and with status migrainosus, not intractable  Neck pain     Discharge Instructions     HEADACHE: You were seen in clinic today for headache. Rest and take meds as directed. If at any point, the headache becomes very severe, is associated with fever, is associated with neck pain/stiffness, you feel like passing out, the headache is different from any you've have had before, there are vision changes/issues with speech/issues with balance, or numbness/weakness in a part of the body, you should be seen urgently or emergently for more serious causes of headache   NECK PAIN: Stressed avoiding painful activities. This can exacerbate your symptoms and make them worse.  May apply heat to the areas of pain for some relief. Use medications as directed. Be aware of which medications make you drowsy and do not drive or operate any kind of heavy machinery while using the medication (ie pain medications or muscle relaxers). F/U with PCP for reexamination or return sooner if condition worsens or does not begin to improve over the next few days.   NECK PAIN RED FLAGS: If symptoms get worse than they are right now, you should  come back sooner for re-evaluation. If you have increased numbness/ tingling or notice that the numbness/tingling is affecting the legs or saddle region, go to ER. If you ever lose continence go to ER.          ED Prescriptions    Medication Sig Dispense Auth. Provider   ketorolac (TORADOL) 10 MG tablet Take 1 tablet (10 mg total) by mouth every 6 (six) hours as needed for up to 5 days (headache). 20 tablet Eusebio Friendly B, PA-C   ondansetron (ZOFRAN ODT) 4 MG disintegrating tablet Take 1 tablet (4 mg total) by mouth every 6 (six) hours as needed for up to 5 days for nausea or vomiting. 20 tablet Shirlee Latch, PA-C  tiZANidine (ZANAFLEX) 4 MG tablet Take 1 tablet (4 mg total) by mouth every 12 (twelve) hours as needed for up to 5 days for muscle spasms. 10 tablet Shirlee Latch, PA-C     I have reviewed the PDMP during this encounter.   Shirlee Latch, PA-C 06/07/20 1219

## 2020-06-07 NOTE — Discharge Instructions (Addendum)
HEADACHE: You were seen in clinic today for headache. Rest and take meds as directed. If at any point, the headache becomes very severe, is associated with fever, is associated with neck pain/stiffness, you feel like passing out, the headache is different from any you've have had before, there are vision changes/issues with speech/issues with balance, or numbness/weakness in a part of the body, you should be seen urgently or emergently for more serious causes of headache   NECK PAIN: Stressed avoiding painful activities. This can exacerbate your symptoms and make them worse.  May apply heat to the areas of pain for some relief. Use medications as directed. Be aware of which medications make you drowsy and do not drive or operate any kind of heavy machinery while using the medication (ie pain medications or muscle relaxers). F/U with PCP for reexamination or return sooner if condition worsens or does not begin to improve over the next few days.   NECK PAIN RED FLAGS: If symptoms get worse than they are right now, you should come back sooner for re-evaluation. If you have increased numbness/ tingling or notice that the numbness/tingling is affecting the legs or saddle region, go to ER. If you ever lose continence go to ER.

## 2020-06-07 NOTE — ED Triage Notes (Signed)
Pt reports having a headache since Saturday. sts she have taken different OTC meds without relief. No other symptoms.

## 2020-08-03 ENCOUNTER — Other Ambulatory Visit: Payer: BC Managed Care – PPO

## 2020-08-03 ENCOUNTER — Other Ambulatory Visit: Payer: Self-pay

## 2020-08-03 DIAGNOSIS — Z20822 Contact with and (suspected) exposure to covid-19: Secondary | ICD-10-CM

## 2020-08-04 LAB — SARS-COV-2, NAA 2 DAY TAT

## 2020-08-04 LAB — NOVEL CORONAVIRUS, NAA: SARS-CoV-2, NAA: DETECTED — AB

## 2020-09-26 ENCOUNTER — Ambulatory Visit: Payer: BC Managed Care – PPO | Admitting: Internal Medicine

## 2020-09-26 ENCOUNTER — Encounter: Payer: Self-pay | Admitting: Internal Medicine

## 2020-09-26 ENCOUNTER — Other Ambulatory Visit: Payer: Self-pay

## 2020-09-26 VITALS — BP 102/82 | HR 66 | Temp 98.3°F | Ht 63.0 in | Wt 120.0 lb

## 2020-09-26 DIAGNOSIS — F17200 Nicotine dependence, unspecified, uncomplicated: Secondary | ICD-10-CM | POA: Insufficient documentation

## 2020-09-26 DIAGNOSIS — Z803 Family history of malignant neoplasm of breast: Secondary | ICD-10-CM

## 2020-09-26 DIAGNOSIS — F3181 Bipolar II disorder: Secondary | ICD-10-CM | POA: Insufficient documentation

## 2020-09-26 DIAGNOSIS — K219 Gastro-esophageal reflux disease without esophagitis: Secondary | ICD-10-CM | POA: Diagnosis not present

## 2020-09-26 NOTE — Patient Instructions (Signed)
Take Pepcid prior to eating foods known to reflux triggers.

## 2020-09-26 NOTE — Progress Notes (Signed)
Date:  09/26/2020   Name:  Adrienne West   DOB:  1990-10-10   MRN:  [id number]   Chief Complaint: Establish Care and ADHD (Wants to get screen for ADHD, son has ADHD )  Gastroesophageal Reflux She complains of heartburn. She reports no chest pain or no coughing. This is a recurrent problem. The heartburn duration is less than a minute. The heartburn is located in the substernum. The heartburn does not wake her from sleep. Associated symptoms include fatigue. She has tried a histamine-2 antagonist and an antibiotic for the symptoms.  Depression        This is a recurrent problem.  Associated symptoms include decreased concentration, fatigue, helplessness, hopelessness, restlessness, decreased interest and appetite change.  Associated symptoms include does not have insomnia, no headaches and no suicidal ideas.  Treatments tried: treated in the past for Bipolar depression but also feels she may have ADHD.   Lab Results  Component Value Date   CREATININE 0.95 11/20/2019   BUN 10 11/20/2019   NA 140 11/20/2019   K 3.4 (L) 11/20/2019   CL 106 11/20/2019   CO2 27 11/20/2019    Lab Results  Component Value Date   WBC 9.0 11/20/2019   HGB 12.4 11/20/2019   HCT 37.1 11/20/2019   MCV 97.9 11/20/2019   PLT 306 11/20/2019   Lab Results  Component Value Date   ALT 19 12/07/2012   AST 16 12/07/2012   ALKPHOS 66 12/07/2012   BILITOT 0.1 (L) 12/07/2012     Review of Systems  Constitutional: Positive for appetite change and fatigue. Negative for fever and unexpected weight change.  HENT: Negative for trouble swallowing.   Respiratory: Negative for cough, chest tightness and shortness of breath.   Cardiovascular: Negative for chest pain, palpitations and leg swelling.  Gastrointestinal: Positive for heartburn. Negative for blood in stool, constipation and diarrhea.       Acid reflux with tomato based and other acidic foods  Neurological: Negative for dizziness and headaches.   Psychiatric/Behavioral: Positive for decreased concentration and depression. Negative for suicidal ideas. The patient does not have insomnia.     Patient Active Problem List   Diagnosis Date Noted  . Bipolar 2 disorder (HCC) 09/26/2020  . Menometrorrhagia 04/29/2018    Allergies  Allergen Reactions  . Hydrocodone Itching  . Tegaderm Ag Mesh [Silver] Itching    Past Surgical History:  Procedure Laterality Date  . APPENDECTOMY    . aspiration of breast cyst Right 2013  . CESAREAN SECTION N/A 10/02/2017   Procedure: CESAREAN SECTION;  Surgeon: Conard Novak, MD;  Location: [facility] ORS;  Service: Obstetrics;  Laterality: N/A;  . LAPAROSCOPIC APPENDECTOMY N/A 2012  . LAPAROSCOPIC BILATERAL SALPINGECTOMY N/A 11/18/2017   Procedure: LAPAROSCOPIC PARTIAL SALPINGECTOMY;  Surgeon: Nadara Mustard, MD;  Location: [facility] ORS;  Service: Gynecology;  Laterality: N/A;  . TUBAL LIGATION    . WISDOM TOOTH EXTRACTION      Social History   Tobacco Use  . Smoking status: Current Every Day Smoker    Packs/day: 1.00    Years: 12.00    Pack years: 12.00    Types: Cigarettes  . Smokeless tobacco: Never Used  Vaping Use  . Vaping Use: Never used  Substance Use Topics  . Alcohol use: Yes    Comment: very rare  . Drug use: No     Medication list has been reviewed and updated.  Current Meds  Medication Sig  . acetaminophen (TYLENOL)  500 MG tablet Take 1,000 mg by mouth every 6 (six) hours as needed (for headaches.).    PHQ 2/9 Scores 09/26/2020  PHQ - 2 Score 4  PHQ- 9 Score 16    GAD 7 : Generalized Anxiety Score 09/26/2020  Nervous, Anxious, on Edge 3  Control/stop worrying 3  Worry too much - different things 3  Trouble relaxing 3  Restless 2  Easily annoyed or irritable 2  Afraid - awful might happen 3  Total GAD 7 Score 19  Anxiety Difficulty Not difficult at all    BP Readings from Last 3 Encounters:  09/26/20 102/82  06/07/20 120/82  02/25/20 110/80    Physical  Exam Vitals and nursing note reviewed.  Constitutional:      General: She is not in acute distress.    Appearance: She is well-developed.  HENT:     Head: Normocephalic and atraumatic.  Neck:     Vascular: No carotid bruit.  Cardiovascular:     Rate and Rhythm: Normal rate and regular rhythm.     Pulses: Normal pulses.     Heart sounds: No murmur heard.   Pulmonary:     Effort: Pulmonary effort is normal. No respiratory distress.  Musculoskeletal:     Cervical back: Normal range of motion.     Right lower leg: No edema.     Left lower leg: No edema.  Lymphadenopathy:     Cervical: No cervical adenopathy.  Skin:    General: Skin is warm and dry.     Findings: No rash.  Neurological:     Mental Status: She is alert and oriented to person, place, and time.  Psychiatric:        Attention and Perception: Attention normal.        Mood and Affect: Mood and affect normal.        Behavior: Behavior normal.        Cognition and Memory: Cognition normal.        Judgment: Judgment normal.     Wt Readings from Last 3 Encounters:  09/26/20 120 lb (54.4 kg)  06/07/20 118 lb (53.5 kg)  02/25/20 123 lb (55.8 kg)    BP 102/82   Pulse 66   Temp 98.3 F (36.8 C) (Oral)   Ht 5\' 3"  (1.6 m)   Wt 120 lb (54.4 kg)   LMP 09/22/2020   SpO2 100%   BMI 21.26 kg/m   Assessment and Plan: 1. Gastroesophageal reflux disease without esophagitis Intermittent sx triggered by certain foods Recommend H2 blocker such as Pepcid prior to eating food triggers  2. Bipolar 2 disorder (HCC) With possible ADHD List of local mental health providers given  3. Tobacco use disorder Noted, counseling given   Partially dictated using 09/24/2020. Any errors are unintentional.  Animal nutritionist, MD [facility]  09/26/2020

## 2021-07-05 ENCOUNTER — Ambulatory Visit
Admission: EM | Admit: 2021-07-05 | Discharge: 2021-07-05 | Disposition: A | Discharge status: Home / Self Care | Payer: BC Managed Care – PPO | Attending: Urgent Care | Admitting: Urgent Care

## 2021-07-05 ENCOUNTER — Other Ambulatory Visit: Payer: Self-pay

## 2021-07-05 DIAGNOSIS — S00411A Abrasion of right ear, initial encounter: Secondary | ICD-10-CM

## 2021-07-05 DIAGNOSIS — H6983 Other specified disorders of Eustachian tube, bilateral: Secondary | ICD-10-CM

## 2021-07-05 DIAGNOSIS — F17200 Nicotine dependence, unspecified, uncomplicated: Secondary | ICD-10-CM

## 2021-07-05 DIAGNOSIS — J3089 Other allergic rhinitis: Secondary | ICD-10-CM

## 2021-07-05 DIAGNOSIS — L089 Local infection of the skin and subcutaneous tissue, unspecified: Secondary | ICD-10-CM

## 2021-07-05 MED ORDER — LEVOCETIRIZINE DIHYDROCHLORIDE 5 MG PO TABS: 5.0000 mg | ORAL_TABLET | Freq: Every evening | ORAL | 0 refills | Status: DC

## 2021-07-05 MED ORDER — PSEUDOEPHEDRINE HCL 60 MG PO TABS
60.0000 mg | ORAL_TABLET | Freq: Three times a day (TID) | ORAL | 0 refills | Status: DC | PRN
Start: 2021-07-05 — End: 2021-08-14

## 2021-07-05 MED ORDER — PREDNISONE 20 MG PO TABS
ORAL_TABLET | ORAL | 0 refills | Status: DC
Start: 2021-07-05 — End: 2021-08-14

## 2021-07-05 MED ORDER — FLUTICASONE PROPIONATE 50 MCG/ACT NA SUSP
2.0000 | Freq: Every day | NASAL | 12 refills | Status: DC
Start: 2021-07-05 — End: 2022-03-19

## 2021-07-05 MED ORDER — MUPIROCIN 2 % EX OINT
1.0000 | TOPICAL_OINTMENT | Freq: Three times a day (TID) | CUTANEOUS | 0 refills | Status: DC
Start: 2021-07-05 — End: 2021-08-14

## 2021-07-05 NOTE — Discharge Instructions (Signed)
Today I would like for you to start taking Xyzal together with the oral prednisone course.  This will help you with eustachian tube dysfunction and allergic rhinitis flare.  Long-term I would like for you to use Xyzal every day and still keep taking this daily. Once you are finished with the oral prednisone course you can start using Flonase together with Xyzal every day.  You can use pseudoephedrine as needed for breakthrough symptoms for your sinuses.  I will be having you use mupirocin ointment as an antibiotic ointment for the right external ear.  Use this 3 times a day for the next week.

## 2021-07-05 NOTE — ED Triage Notes (Signed)
Pt present right ear pain with pressure in the left ear. She also complains of nasal congestion and drainage. Symptoms started two days ago. Pt tried otc medication with no relief.

## 2021-07-05 NOTE — ED Provider Notes (Signed)
[facility]   MRN: [id number] DOB: 1990/09/08  Subjective:   Adrienne West is a 30 y.o. female presenting for 4 day history of acute onset recurrent sinus congestion, sinus pressure, right ear pain, had slight drainage yesterday from her ear.  Has chronic intermittent cough.  Patient is a smoker, does 1/2ppd.  No chest pain, shortness of breath or wheezing.  Patient does not take any medications consistently for her allergies.  No current facility-administered medications for this encounter.  Current Outpatient Medications:    acetaminophen (TYLENOL) 500 MG tablet, Take 1,000 mg by mouth every 6 (six) hours as needed (for headaches.)., Disp: , Rfl:    cetirizine (ZYRTEC) 10 MG tablet, Take 10 mg by mouth at bedtime. (Patient not taking: No sig reported), Disp: , Rfl:    Allergies  Allergen Reactions   Hydrocodone Itching   Tegaderm Ag Mesh [Silver] Itching    Past Medical History:  Diagnosis Date   Anemia    Anxiety    Bipolar disorder (HCC)    GERD (gastroesophageal reflux disease)    occ   Headache    migraines     Past Surgical History:  Procedure Laterality Date   APPENDECTOMY     aspiration of breast cyst Right 2013   CESAREAN SECTION N/A 10/02/2017   Procedure: CESAREAN SECTION;  Surgeon: Conard Novak, MD;  Location: [facility];  Service: Obstetrics;  Laterality: N/A;   LAPAROSCOPIC APPENDECTOMY N/A 2012   LAPAROSCOPIC BILATERAL SALPINGECTOMY N/A 11/18/2017   Procedure: LAPAROSCOPIC PARTIAL SALPINGECTOMY;  Surgeon: Nadara Mustard, MD;  Location: [facility];  Service: Gynecology;  Laterality: N/A;   TUBAL LIGATION     WISDOM TOOTH EXTRACTION      Family History  Problem Relation Age of Onset   Bipolar disorder Mother    Endometriosis Mother    Breast cancer Mother 39   Anxiety disorder Mother    Depression Mother    Stroke Father    Hyperlipidemia Father    Hypertension Father    Bipolar disorder Sister    Anxiety disorder Sister     Depression Sister    COPD Maternal Grandmother    Hypothyroidism Maternal Grandmother    Diabetes Maternal Grandfather    Rheum arthritis Paternal Grandmother    Liver disease Paternal Grandmother    AAA (abdominal aortic aneurysm) Paternal Grandfather    Stroke Paternal Grandfather    Vascular Disease Paternal Grandfather     Social History   Tobacco Use   Smoking status: Every Day    Packs/day: 1.00    Years: 12.00    Pack years: 12.00    Types: Cigarettes   Smokeless tobacco: Never  Vaping Use   Vaping Use: Never used  Substance Use Topics   Alcohol use: Yes    Comment: very rare   Drug use: No    ROS   Objective:   Vitals: BP 110/75 (BP Location: Left Arm)    Pulse 70    Temp 98.1 F (36.7 C) (Oral)    Resp 16    LMP 06/21/2021    SpO2 99%   Physical Exam Constitutional:      General: She is not in acute distress.    Appearance: Normal appearance. She is well-developed. She is not ill-appearing, toxic-appearing or diaphoretic.  HENT:     Head: Normocephalic and atraumatic.     Right Ear: Tympanic membrane and ear canal normal. No drainage or tenderness. No middle ear effusion. There is no  impacted cerumen. Tympanic membrane is not erythematous.     Left Ear: Tympanic membrane, ear canal and external ear normal. No drainage or tenderness.  No middle ear effusion. There is no impacted cerumen. Tympanic membrane is not erythematous.     Ears:     Comments: There is a minor abrasion at the distal most portion of the right external ear canal with exquisite tenderness.  No spontaneous drainage of pus or bleeding.    Nose: Congestion present. No rhinorrhea.     Comments: Nasal mucosa boggy and edematous.    Mouth/Throat:     Mouth: Mucous membranes are moist. No oral lesions.     Pharynx: No pharyngeal swelling, oropharyngeal exudate, posterior oropharyngeal erythema or uvula swelling.     Tonsils: No tonsillar exudate or tonsillar abscesses.  Eyes:     General: No  scleral icterus.       Right eye: No discharge.        Left eye: No discharge.     Extraocular Movements: Extraocular movements intact.     Right eye: Normal extraocular motion.     Left eye: Normal extraocular motion.     Conjunctiva/sclera: Conjunctivae normal.     Pupils: Pupils are equal, round, and reactive to light.  Cardiovascular:     Rate and Rhythm: Normal rate and regular rhythm.     Pulses: Normal pulses.     Heart sounds: Normal heart sounds. No murmur heard.   No friction rub. No gallop.  Pulmonary:     Effort: Pulmonary effort is normal. No respiratory distress.     Breath sounds: Normal breath sounds. No stridor. No wheezing, rhonchi or rales.  Musculoskeletal:     Cervical back: Normal range of motion and neck supple.  Lymphadenopathy:     Cervical: No cervical adenopathy.  Skin:    General: Skin is warm and dry.     Findings: No rash.  Neurological:     General: No focal deficit present.     Mental Status: She is alert and oriented to person, place, and time.  Psychiatric:        Mood and Affect: Mood normal.        Behavior: Behavior normal.        Thought Content: Thought content normal.    Assessment and Plan :   PDMP not reviewed this encounter.  1. Allergic rhinitis due to other allergic trigger, unspecified seasonality   2. Infected abrasion   3. Eustachian tube dysfunction, bilateral   4. Smoker    Recommended mupirocin ointment for the infected abrasion of the right external ear canal.  No signs of otitis media and therefore we will hold off on antibiotics for this.  Suspect an allergic rhinitis that is untreated and therefore as patient requested aggressive management we will use an oral prednisone course for now.  Long-term would like for her to use Xyzal, Flonase and pseudoephedrine (prn for breakthrough symptoms). Counseled patient on potential for adverse effects with medications prescribed/recommended today, ER and return-to-clinic precautions  discussed, patient verbalized understanding.    Wallis Bamberg, PA-C 07/05/21 1018

## 2021-08-14 ENCOUNTER — Other Ambulatory Visit: Payer: Self-pay

## 2021-08-14 ENCOUNTER — Ambulatory Visit
Admission: EM | Admit: 2021-08-14 | Discharge: 2021-08-14 | Disposition: A | Discharge status: Home / Self Care | Payer: BC Managed Care – PPO | Attending: Emergency Medicine | Admitting: Emergency Medicine

## 2021-08-14 DIAGNOSIS — J020 Streptococcal pharyngitis: Secondary | ICD-10-CM | POA: Diagnosis not present

## 2021-08-14 DIAGNOSIS — R519 Headache, unspecified: Secondary | ICD-10-CM | POA: Diagnosis not present

## 2021-08-14 DIAGNOSIS — Z20822 Contact with and (suspected) exposure to covid-19: Secondary | ICD-10-CM | POA: Insufficient documentation

## 2021-08-14 DIAGNOSIS — J029 Acute pharyngitis, unspecified: Secondary | ICD-10-CM | POA: Diagnosis not present

## 2021-08-14 LAB — GROUP A STREP BY PCR: Group A Strep by PCR: DETECTED — AB

## 2021-08-14 LAB — RESP PANEL BY RT-PCR (FLU A&B, COVID) ARPGX2
Influenza A by PCR: NEGATIVE
Influenza B by PCR: NEGATIVE
SARS Coronavirus 2 by RT PCR: NEGATIVE

## 2021-08-14 MED ORDER — PENICILLIN V POTASSIUM 500 MG PO TABS: 500.0000 mg | ORAL_TABLET | Freq: Two times a day (BID) | ORAL | 0 refills | Status: DC

## 2021-08-14 MED ORDER — PENICILLIN G BENZATHINE 1200000 UNIT/2ML IM SUSY
1.2000 10*6.[IU] | PREFILLED_SYRINGE | Freq: Once | INTRAMUSCULAR | Status: AC
  Administered 2021-08-14: 1.2 10*6.[IU] via INTRAMUSCULAR

## 2021-08-14 NOTE — ED Triage Notes (Signed)
Patient presents to Urgent Care for sore throat, body aches, headache since today. Not treating symptoms.   Denies fever.

## 2021-08-14 NOTE — ED Provider Notes (Signed)
[facility]    CSN: [id number] Arrival date & time: 08/14/21  1827      History   Chief Complaint Chief Complaint  Patient presents with   Sore Throat   Generalized Body Aches   Headache    HPI Adrienne West is a 31 y.o. female presents with ST, body aches, HA since today. She denies noticing a fever. All her 3 kids have had strep throat. Has mild rhinitis and cough as well.     Past Medical History:  Diagnosis Date   Anemia    Anxiety    Bipolar disorder (HCC)    GERD (gastroesophageal reflux disease)    occ   Headache    migraines    Patient Active Problem List   Diagnosis Date Noted   Bipolar 2 disorder (HCC) 09/26/2020   Tobacco use disorder 09/26/2020   Gastroesophageal reflux disease without esophagitis 09/26/2020   Family history of breast cancer in mother 09/26/2020   Menometrorrhagia 04/29/2018    Past Surgical History:  Procedure Laterality Date   APPENDECTOMY     aspiration of breast cyst Right 2013   CESAREAN SECTION N/A 10/02/2017   Procedure: CESAREAN SECTION;  Surgeon: Conard Novak, MD;  Location: [facility];  Service: Obstetrics;  Laterality: N/A;   LAPAROSCOPIC APPENDECTOMY N/A 2012   LAPAROSCOPIC BILATERAL SALPINGECTOMY N/A 11/18/2017   Procedure: LAPAROSCOPIC PARTIAL SALPINGECTOMY;  Surgeon: Nadara Mustard, MD;  Location: [facility];  Service: Gynecology;  Laterality: N/A;   TUBAL LIGATION     WISDOM TOOTH EXTRACTION      OB History     Gravida  3   Para  3   Term  2   Preterm  1   AB  0   Living  3      SAB  0   IAB  0   Ectopic  0   Multiple  0   Live Births  3            Home Medications    Prior to Admission medications   Medication Sig Start Date End Date Taking? Authorizing Provider  penicillin v potassium (VEETID) 500 MG tablet Take 1 tablet (500 mg total) by mouth in the morning and at bedtime for 10 days. 08/14/21 08/24/21 Yes Rodriguez-Southworth, Nettie Elm, PA-C  acetaminophen  (TYLENOL) 500 MG tablet Take 1,000 mg by mouth every 6 (six) hours as needed (for headaches.).    [provider]  fluticasone (FLONASE) 50 MCG/ACT nasal spray Place 2 sprays into both nostrils daily. 07/05/21   Wallis Bamberg, PA-C  levocetirizine (XYZAL) 5 MG tablet Take 1 tablet (5 mg total) by mouth every evening. 07/05/21   Wallis Bamberg, PA-C  mupirocin ointment (BACTROBAN) 2 % Apply 1 application topically 3 (three) times daily. 07/05/21   Wallis Bamberg, PA-C  predniSONE (DELTASONE) 20 MG tablet Take 2 tablets daily with breakfast. 07/05/21   Wallis Bamberg, PA-C  pseudoephedrine (SUDAFED) 60 MG tablet Take 1 tablet (60 mg total) by mouth every 8 (eight) hours as needed for congestion. 07/05/21   Wallis Bamberg, PA-C    Family History Family History  Problem Relation Age of Onset   Bipolar disorder Mother    Endometriosis Mother    Breast cancer Mother 36   Anxiety disorder Mother    Depression Mother    Stroke Father    Hyperlipidemia Father    Hypertension Father    Bipolar disorder Sister    Anxiety disorder Sister  Depression Sister    COPD Maternal Grandmother    Hypothyroidism Maternal Grandmother    Diabetes Maternal Grandfather    Rheum arthritis Paternal Grandmother    Liver disease Paternal Grandmother    AAA (abdominal aortic aneurysm) Paternal Grandfather    Stroke Paternal Grandfather    Vascular Disease Paternal Grandfather     Social History Social History   Tobacco Use   Smoking status: Every Day    Packs/day: 1.00    Years: 12.00    Pack years: 12.00    Types: Cigarettes   Smokeless tobacco: Never  Vaping Use   Vaping Use: Never used  Substance Use Topics   Alcohol use: Yes    Comment: very rare   Drug use: No     Allergies   Hydrocodone and Tegaderm ag mesh [silver]   Review of Systems Review of Systems  Constitutional:  Positive for appetite change and chills. Negative for diaphoresis, fatigue and fever.  HENT:  Positive for  congestion, rhinorrhea and sore throat. Negative for ear discharge and ear pain.   Eyes:  Negative for discharge.  Respiratory:  Positive for cough.   Musculoskeletal:  Positive for myalgias.  Skin:  Negative for rash.  Neurological:  Positive for headaches.  Hematological:  Negative for adenopathy.    Physical Exam Triage Vital Signs ED Triage Vitals  Enc Vitals Group     BP 08/14/21 1835 105/70     Pulse Rate 08/14/21 1835 82     Resp 08/14/21 1835 16     Temp 08/14/21 1835 99.5 F (37.5 C)     Temp Source 08/14/21 1835 Oral     SpO2 08/14/21 1835 99 %     Weight --      Height --      Head Circumference --      Peak Flow --      Pain Score 08/14/21 1840 8     Pain Loc --      Pain Edu? --      Excl. in GC? --    No data found.  Updated Vital Signs BP 105/70 (BP Location: Left Arm)    Pulse 82    Temp 99.5 F (37.5 C) (Oral)    Resp 16    SpO2 99%   Visual Acuity Right Eye Distance:   Left Eye Distance:   Bilateral Distance:    Right Eye Near:   Left Eye Near:    Bilateral Near:     Physical Exam Physical Exam Vitals signs and nursing note reviewed.  Constitutional:      General: She is not in acute distress.    Appearance: Normal appearance. She is not ill-appearing, toxic-appearing or diaphoretic.  HENT:     Head: Normocephalic.     Right Ear: Tympanic membrane, ear canal and external ear normal.     Left Ear: Tympanic membrane, ear canal and external ear normal.     Nose: with clear rhinitis      Mouth/Throat: erythematous with no exudate    Mouth: Mucous membranes are moist.  Eyes:     General: No scleral icterus.       Right eye: No discharge.        Left eye: No discharge.     Conjunctiva/sclera: Conjunctivae normal.  Neck:     Musculoskeletal: Neck supple. No neck rigidity.  Cardiovascular:     Rate and Rhythm: Normal rate and regular rhythm.     Heart sounds: No murmur.  Pulmonary:     Effort: Pulmonary effort is normal.     Breath  sounds: Normal breath sounds.  Musculoskeletal: Normal range of motion.  Lymphadenopathy:     Cervical: No cervical adenopathy.  Skin:    General: Skin is warm and dry.     Coloration: Skin is not jaundiced.     Findings: No rash.  Neurological:     Mental Status: She is alert and oriented to person, place, and time.     Gait: Gait normal.  Psychiatric:        Mood and Affect: Mood normal.        Behavior: Behavior normal.        Thought Content: Thought content normal.        Judgment: Judgment normal.    UC Treatments / Results  Labs (all labs ordered are listed, but only abnormal results are displayed) Labs Reviewed  GROUP A STREP BY PCR - Abnormal; Notable for the following components:      Result Value   Group A Strep by PCR DETECTED (*)    All other components within normal limits  RESP PANEL BY RT-PCR (FLU A&B, COVID) [id number]  Flu A&B and Covid test are negative.   EKG   Radiology No results found.  Procedures Procedures (including critical care time)  Medications Ordered in UC Medications  penicillin g benzathine (BICILLIN LA) 1200000 UNIT/2ML injection 1.2 Million Units (1.2 Million Units Intramuscular Given 08/14/21 1952)    Initial Impression / Assessment and Plan / UC Course  I have reviewed the triage vital signs and the nursing notes. Pertinent labs results that were available during my care of the patient were reviewed by me and considered in my medical decision making (see chart for details). Has strep throat and URI She was given Bicillin IM as noted  here per her request.    Final Clinical Impressions(s) / UC Diagnoses   Final diagnoses:  Strep pharyngitis     Discharge Instructions      Make sure to finish all the antibiotic  Throw away your tooth brush after having 2 doses of the antibiotic.      ED Prescriptions     Medication Sig Dispense Auth. Provider   penicillin v potassium (VEETID) 500 MG tablet Take 1 tablet (500 mg total)  by mouth in the morning and at bedtime for 10 days. 20 tablet Rodriguez-Southworth, Nettie Elm, PA-C      PDMP not reviewed this encounter.   Garey Ham, Cordelia Poche 08/14/21 2002

## 2021-08-14 NOTE — Discharge Instructions (Addendum)
Make sure to finish all the antibiotic  Throw away your tooth brush after having 2 doses of the antibiotic.

## 2021-11-15 ENCOUNTER — Ambulatory Visit
Admission: EM | Admit: 2021-11-15 | Discharge: 2021-11-15 | Disposition: A | Discharge status: Home / Self Care | Payer: BC Managed Care – PPO | Attending: Emergency Medicine | Admitting: Emergency Medicine

## 2021-11-15 DIAGNOSIS — R0982 Postnasal drip: Secondary | ICD-10-CM | POA: Insufficient documentation

## 2021-11-15 DIAGNOSIS — J309 Allergic rhinitis, unspecified: Secondary | ICD-10-CM | POA: Insufficient documentation

## 2021-11-15 DIAGNOSIS — J029 Acute pharyngitis, unspecified: Secondary | ICD-10-CM | POA: Insufficient documentation

## 2021-11-15 LAB — GROUP A STREP BY PCR: Group A Strep by PCR: NOT DETECTED

## 2021-11-15 MED ORDER — IPRATROPIUM BROMIDE 0.06 % NA SOLN: 2.0000 | Freq: Four times a day (QID) | NASAL | 12 refills | Status: DC

## 2021-11-15 NOTE — ED Provider Notes (Signed)
?[facility] ? ? ? ?CSN: [id number] ?Arrival date & time: 11/15/21  0930 ? ? ?  ? ?History   ?Chief Complaint ?Chief Complaint  ?Patient presents with  ? Sore Throat  ? ? ?HPI ?Adrienne West is a 31 y.o. female.  ? ?HPI ? ?31 year old female here for evaluation of sore throat. ? ?Patient reports that she has been experiencing a sore throat for the past 5 days that has been getting worse and not better.  She states she feels he has a knot under her tongue and swollen lymph nodes in her neck.  She does have pain with swallowing and her pain is worse at night.  This has made it difficult for her to sleep at times.  In addition, she has runny nose, nasal congestion, ear pain, and a scant cough that is both productive and nonproductive.  She denies fever, nasal discharge.  She does not have any muffled voice.  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  She does take Xyzal and Sudafed daily for her allergies. ? ?Past Medical History:  ?Diagnosis Date  ? Anemia   ? Anxiety   ? Bipolar disorder (HCC)   ? GERD (gastroesophageal reflux disease)   ? occ  ? Headache   ? migraines  ? ? ?Patient Active Problem List  ? Diagnosis Date Noted  ? Bipolar 2 disorder (HCC) 09/26/2020  ? Tobacco use disorder 09/26/2020  ? Gastroesophageal reflux disease without esophagitis 09/26/2020  ? Family history of breast cancer in mother 09/26/2020  ? Menometrorrhagia 04/29/2018  ? ? ?Past Surgical History:  ?Procedure Laterality Date  ? APPENDECTOMY    ? aspiration of breast cyst Right 2013  ? CESAREAN SECTION N/A 10/02/2017  ? Procedure: CESAREAN SECTION;  Surgeon: Conard Novak, MD;  Location: [facility] OR[facility];  Service: Obstetrics;  Laterality: N/A;  ? LAPAROSCOPIC APPENDECTOMY N/A 2012  ? LAPAROSCOPIC BILATERAL SALPINGECTOMY N/A 11/18/2017  ? Procedure: LAPAROSCOPIC PARTIAL SALPINGECTOMY;  Surgeon: Nadara Mustard, MD;  Location: [facility] OR[facility];  Service:  Gynecology;  Laterality: N/A;  ? TUBAL LIGATION    ? WISDOM TOOTH EXTRACTION    ? ? ?OB History   ? ? Gravida  ?3  ? Para  ?3  ? Term  ?2  ? Preterm  ?1  ? AB  ?0  ? Living  ?3  ?  ? ? SAB  ?0  ? IAB  ?0  ? Ectopic  ?0  ? Multiple  ?0  ? Live Births  ?3  ?   ?  ?  ? ? ? ?Home Medications   ? ?Prior to Admission medications   ?Medication Sig Start Date End Date Taking? Authorizing Provider  ?fluticasone (FLONASE) 50 MCG/ACT nasal spray Place 2 sprays into both nostrils daily. 07/05/21  Yes Wallis Bamberg, PA-C  ?ipratropium (ATROVENT) 0.06 % nasal spray Place 2 sprays into both nostrils 4 (four) times daily. 11/15/21  Yes Becky Augusta, NP  ?levocetirizine (XYZAL) 5 MG tablet Take 1 tablet (5 mg total) by mouth every evening. 07/05/21  Yes Wallis Bamberg, PA-C  ?UNABLE TO FIND Med Name: Sudafed   Yes [provider]  ?acetaminophen (TYLENOL) 500 MG tablet Take 1,000 mg by mouth every 6 (six) hours as needed (for headaches.).    [provider]  ? ? ?Family History ?Family History  ?Problem Relation Age of Onset  ?  Bipolar disorder Mother   ? Endometriosis Mother   ? Breast cancer Mother 70  ? Anxiety disorder Mother   ? Depression Mother   ? Stroke Father   ? Hyperlipidemia Father   ? Hypertension Father   ? Bipolar disorder Sister   ? Anxiety disorder Sister   ? Depression Sister   ? COPD Maternal Grandmother   ? Hypothyroidism Maternal Grandmother   ? Diabetes Maternal Grandfather   ? Rheum arthritis Paternal Grandmother   ? Liver disease Paternal Grandmother   ? AAA (abdominal aortic aneurysm) Paternal Grandfather   ? Stroke Paternal Grandfather   ? Vascular Disease Paternal Grandfather   ? ? ?Social History ?Social History  ? ?Tobacco Use  ? Smoking status: Every Day  ?  Packs/day: 1.00  ?  Years: 12.00  ?  Pack years: 12.00  ?  Types: Cigarettes  ? Smokeless tobacco: Never  ?Vaping Use  ? Vaping Use: Never used  ?Substance Use Topics  ? Alcohol use: Yes  ?  Comment: very rare  ? Drug use: No   ? ? ? ?Allergies   ?Hydrocodone and Tegaderm ag mesh [silver] ? ? ?Review of Systems ?Review of Systems  ?Constitutional:  Negative for fever.  ?HENT:  Positive for congestion, ear pain, sore throat and trouble swallowing. Negative for rhinorrhea and voice change.   ?Respiratory:  Positive for cough. Negative for wheezing.   ?Hematological: Negative.   ?Psychiatric/Behavioral: Negative.    ? ? ?Physical Exam ?Triage Vital Signs ?ED Triage Vitals  ?Enc Vitals Group  ?   BP 11/15/21 1032 109/76  ?   Pulse Rate 11/15/21 1032 67  ?   Resp 11/15/21 1032 18  ?   Temp 11/15/21 1032 98.8 ?F (37.1 ?C)  ?   Temp Source 11/15/21 1032 Oral  ?   SpO2 11/15/21 1032 100 %  ?   Weight 11/15/21 1029 120 lb (54.4 kg)  ?   Height 11/15/21 1029 5\' 2"  (1.575 m)  ?   Head Circumference --   ?   Peak Flow --   ?   Pain Score 11/15/21 1025 7  ?   Pain Loc --   ?   Pain Edu? --   ?   Excl. in GC? --   ? ?No data found. ? ?Updated Vital Signs ?BP 109/76 (BP Location: Left Arm)   Pulse 67   Temp 98.8 ?F (37.1 ?C) (Oral)   Resp 18   Ht 5\' 2"  (1.575 m)   Wt 120 lb (54.4 kg)   LMP 10/29/2021 (Approximate)   SpO2 100%   BMI 21.95 kg/m?  ? ?Visual Acuity ?Right Eye Distance:   ?Left Eye Distance:   ?Bilateral Distance:   ? ?Right Eye Near:   ?Left Eye Near:    ?Bilateral Near:    ? ?Physical Exam ?Vitals and nursing note reviewed.  ?Constitutional:   ?   General: She is not in acute distress. ?   Appearance: Normal appearance. She is not ill-appearing.  ?HENT:  ?   Head: Normocephalic and atraumatic.  ?   Right Ear: Tympanic membrane, ear canal and external ear normal. There is no impacted cerumen.  ?   Left Ear: Tympanic membrane, ear canal and external ear normal. There is no impacted cerumen.  ?   Nose: Congestion and rhinorrhea present.  ?   Mouth/Throat:  ?   Mouth: Mucous membranes are moist.  ?   Pharynx: Oropharynx is clear. Posterior oropharyngeal erythema  present. No oropharyngeal exudate.  ?Cardiovascular:  ?   Rate and Rhythm:  Normal rate and regular rhythm.  ?   Pulses: Normal pulses.  ?   Heart sounds: Normal heart sounds. No murmur heard. ?  No friction rub. No gallop.  ?Pulmonary:  ?   Effort: Pulmonary effort is normal.  ?   Breath sounds: Normal breath sounds. No stridor. No wheezing, rhonchi or rales.  ?Musculoskeletal:  ?   Cervical back: Normal range of motion and neck supple.  ?Lymphadenopathy:  ?   Cervical: Cervical adenopathy present.  ?Skin: ?   General: Skin is warm and dry.  ?   Capillary Refill: Capillary refill takes less than 2 seconds.  ?   Findings: No erythema or rash.  ?Neurological:  ?   General: No focal deficit present.  ?   Mental Status: She is alert and oriented to person, place, and time.  ?Psychiatric:     ?   Mood and Affect: Mood normal.     ?   Behavior: Behavior normal.     ?   Thought Content: Thought content normal.     ?   Judgment: Judgment normal.  ? ? ? ?UC Treatments / Results  ?Labs ?(all labs ordered are listed, but only abnormal results are displayed) ?Labs Reviewed  ?GROUP A STREP BY PCR  ? ? ?EKG ? ? ?Radiology ?No results found. ? ?Procedures ?Procedures (including critical care time) ? ?Medications Ordered in UC ?Medications - No data to display ? ?Initial Impression / Assessment and Plan / UC Course  ?I have reviewed the triage vital signs and the nursing notes. ? ?Pertinent labs & imaging results that were available during my care of the patient were reviewed by me and considered in my medical decision making (see chart for details). ? ?Patient is a nontoxic-appearing 31 year old female here for evaluation of sore throat as outlined in HPI above.  On exam patient has pearly-gray tympanic membranes bilaterally with normal light reflex and clear external auditory canals.  Nasal mucosa is mildly pale and edematous with dried yellow discharge in both nares.  Oropharyngeal exam reveals 1+ edema to bilateral tonsillar pillars with mild erythema.  No exudate appreciated.  Posterior oropharynx is  erythematous and injected with clear postnasal drip.  Patient does have bilateral anterior cervical lymphadenopathy that is tender to palpation.  No submandibular or submental fullness appreciated on exam.  No strid

## 2021-11-15 NOTE — ED Triage Notes (Signed)
Patient is here for "sore throat". ? Environmental issue (related to work). 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 "Sore throat started about 5 days ago, but much worse now". "I feel like I have knots under my tongue, and lymph nodes in neck". No fever.  ?

## 2021-11-15 NOTE — Discharge Instructions (Signed)
Your strep test today was negative.  I believe that your symptoms are combination of environmental exposure and allergens. ? ?Use the Atrovent nasal spray, 2 squirts up each nostril every 6 hours, help with nasal congestion and alleviate postnasal drip. ? ?Continue to use your Xyzal and Flonase daily as directed. ? ?Perform sinus irrigation 2-3 times a day.  Use a NeilMed sinus rinse kit and distilled water, do not use tap water, to perform the irrigation to washout environmental contaminants which are leading to inflammation as well as washout mucus so that does not harbor a place for infection.  This will scar down drainage. ? ?You can use over-the-counter Tylenol and ibuprofen according to the package instructions as needed for pain. ? ?You may also use over-the-counter Chloraseptic or Sucrets lozenges, 1 lozenge every 2 hours, to help with sore throat pain. ? ?You may also gargle with warm salt water or Listerine to help soothe his throat and decrease inflammation of your tissues. ? ?Return for reevaluation, or see your PCP, for continued or worsening symptoms. ?

## 2021-12-24 NOTE — Progress Notes (Unsigned)
Adrienne Milan, MD   No chief complaint on file.   HPI:      [name] is a 31 y.o. [phone] whose LMP was No LMP recorded., presents today for *** Neg pap 11/12/17   Patient Active Problem List   Diagnosis Date Noted   Bipolar 2 disorder (HCC) 09/26/2020   Tobacco use disorder 09/26/2020   Gastroesophageal reflux disease without esophagitis 09/26/2020   Family history of breast cancer in mother 09/26/2020   Menometrorrhagia 04/29/2018    Past Surgical History:  Procedure Laterality Date   APPENDECTOMY     aspiration of breast cyst Right 2013   CESAREAN SECTION N/A 10/02/2017   Procedure: CESAREAN SECTION;  Surgeon: Conard Novak, MD;  Location: [facility];  Service: Obstetrics;  Laterality: N/A;   LAPAROSCOPIC APPENDECTOMY N/A 2012   LAPAROSCOPIC BILATERAL SALPINGECTOMY N/A 11/18/2017   Procedure: LAPAROSCOPIC PARTIAL SALPINGECTOMY;  Surgeon: Nadara Mustard, MD;  Location: [facility];  Service: Gynecology;  Laterality: N/A;   TUBAL LIGATION     WISDOM TOOTH EXTRACTION      Family History  Problem Relation Age of Onset   Bipolar disorder Mother    Endometriosis Mother    Breast cancer Mother 44   Anxiety disorder Mother    Depression Mother    Stroke Father    Hyperlipidemia Father    Hypertension Father    Bipolar disorder Sister    Anxiety disorder Sister    Depression Sister    COPD Maternal Grandmother    Hypothyroidism Maternal Grandmother    Diabetes Maternal Grandfather    Rheum arthritis Paternal Grandmother    Liver disease Paternal Grandmother    AAA (abdominal aortic aneurysm) Paternal Grandfather    Stroke Paternal Grandfather    Vascular Disease Paternal Grandfather     Social History   Socioeconomic History   Marital status: Married    Spouse name: Engineer, agricultural   Number of children: 3   Years of education: Not on file   Highest education level: Some college, no degree  Occupational History   Not on file  Tobacco Use    Smoking status: Every Day    Packs/day: 1.00    Years: 12.00    Total pack years: 12.00    Types: Cigarettes   Smokeless tobacco: Never  Vaping Use   Vaping Use: Never used  Substance and Sexual Activity   Alcohol use: Yes    Comment: very rare   Drug use: No   Sexual activity: Yes    Birth control/protection: Surgical  Other Topics Concern   Not on file  Social History Narrative   Not on file   Social Determinants of Health   Financial Resource Strain: High Risk (10/29/2017)   Overall Financial Resource Strain (CARDIA)    Difficulty of Paying Living Expenses: Very hard  Food Insecurity: Food Insecurity Present (10/29/2017)   Hunger Vital Sign    Worried About Running Out of Food in the Last Year: Often true    Ran Out of Food in the Last Year: Often true  Transportation Needs: Not on file  Physical Activity: Inactive (10/29/2017)   Exercise Vital Sign    Days of Exercise per Week: 0 days    Minutes of Exercise per Session: 0 min  Stress: Not on file  Social Connections: Not on file  Intimate Partner Violence: Not on file    Outpatient Medications Prior to Visit  Medication Sig Dispense Refill   acetaminophen (  TYLENOL) 500 MG tablet Take 1,000 mg by mouth every 6 (six) hours as needed (for headaches.).     fluticasone (FLONASE) 50 MCG/ACT nasal spray Place 2 sprays into both nostrils daily. 16 g 12   ipratropium (ATROVENT) 0.06 % nasal spray Place 2 sprays into both nostrils 4 (four) times daily. 15 mL 12   levocetirizine (XYZAL) 5 MG tablet Take 1 tablet (5 mg total) by mouth every evening. 90 tablet 0   UNABLE TO FIND Med Name: Sudafed     No facility-administered medications prior to visit.      ROS:  Review of Systems BREAST: No symptoms   OBJECTIVE:   Vitals:  There were no vitals taken for this visit.  Physical Exam  Results: No results found for this or any previous visit (from the past 24 hour(s)).   Assessment/Plan: No diagnosis  found.    No orders of the defined types were placed in this encounter.     No follow-ups on file.  Kerrigan Gombos B. Bettyjean Stefanski, PA-C 12/24/2021 3:35 PM

## 2021-12-25 ENCOUNTER — Ambulatory Visit (INDEPENDENT_AMBULATORY_CARE_PROVIDER_SITE_OTHER): Payer: BC Managed Care – PPO | Admitting: Obstetrics and Gynecology

## 2021-12-25 ENCOUNTER — Encounter: Payer: Self-pay | Admitting: Obstetrics and Gynecology

## 2021-12-25 VITALS — BP 100/70 | Ht 62.0 in | Wt 125.0 lb

## 2021-12-25 DIAGNOSIS — Z1151 Encounter for screening for human papillomavirus (HPV): Secondary | ICD-10-CM

## 2021-12-25 DIAGNOSIS — Z124 Encounter for screening for malignant neoplasm of cervix: Secondary | ICD-10-CM

## 2021-12-25 DIAGNOSIS — Z30013 Encounter for initial prescription of injectable contraceptive: Secondary | ICD-10-CM | POA: Diagnosis not present

## 2021-12-25 MED ORDER — MEDROXYPROGESTERONE ACETATE 150 MG/ML IM SUSP: 150.0000 mg | INTRAMUSCULAR | 0 refills | Status: DC

## 2021-12-25 MED ORDER — MEDROXYPROGESTERONE ACETATE 150 MG/ML IM SUSP
150.0000 mg | Freq: Once | INTRAMUSCULAR | Status: AC
  Administered 2021-12-25: 150 mg via INTRAMUSCULAR

## 2021-12-25 NOTE — Patient Instructions (Signed)
I value your feedback and you entrusting us with your care. If you get a [facility] patient survey, I would appreciate you taking the time to let us know about your experience today. Thank you! ? ? ?

## 2022-03-11 DIAGNOSIS — J329 Chronic sinusitis, unspecified: Secondary | ICD-10-CM | POA: Diagnosis not present

## 2022-03-17 NOTE — Progress Notes (Unsigned)
PCP:  Reubin Milan, MD   No chief complaint on file.    HPI:      [name] is a 31 y.o. [phone] whose LMP was No LMP recorded., presents today for her annual examination.  Her menses are {norm/abn:715}, lasting {number: 22536} days.  Dysmenorrhea {dysmen:716}. She {does:18564} have intermenstrual bleeding.  Sex activity: {sex active: 315163}.  Last Pap: 11/22/17 Results were: no abnormalities Hx of STDs: {STD hx:14358}  There is no FH of breast cancer. There is no FH of ovarian cancer. The patient {does:18564} do self-breast exams.  Tobacco use: {tob:20664} Alcohol use: {Alcohol:11675} No drug use.  Exercise: {exercise:31265}  She {does:18564} get adequate calcium and Vitamin D in her diet.  Patient Active Problem List   Diagnosis Date Noted   Bipolar 2 disorder (HCC) 09/26/2020   Tobacco use disorder 09/26/2020   Gastroesophageal reflux disease without esophagitis 09/26/2020   Family history of breast cancer in mother 09/26/2020   Menometrorrhagia 04/29/2018    Past Surgical History:  Procedure Laterality Date   APPENDECTOMY     aspiration of breast cyst Right 2013   CESAREAN SECTION N/A 10/02/2017   Procedure: CESAREAN SECTION;  Surgeon: Conard Novak, MD;  Location: [facility];  Service: Obstetrics;  Laterality: N/A;   LAPAROSCOPIC APPENDECTOMY N/A 2012   LAPAROSCOPIC BILATERAL SALPINGECTOMY N/A 11/18/2017   Procedure: LAPAROSCOPIC PARTIAL SALPINGECTOMY;  Surgeon: Nadara Mustard, MD;  Location: [facility];  Service: Gynecology;  Laterality: N/A;   TUBAL LIGATION     WISDOM TOOTH EXTRACTION      Family History  Problem Relation Age of Onset   Bipolar disorder Mother    Endometriosis Mother    Breast cancer Mother 71   Anxiety disorder Mother    Depression Mother    Stroke Father    Hyperlipidemia Father    Hypertension Father    Bipolar disorder Sister    Anxiety disorder Sister    Depression Sister    COPD Maternal Grandmother     Hypothyroidism Maternal Grandmother    Diabetes Maternal Grandfather    Other Maternal Grandfather        Hepatic Encepholapathy   Rheum arthritis Paternal Grandmother    Liver disease Paternal Grandmother    AAA (abdominal aortic aneurysm) Paternal Grandfather    Stroke Paternal Grandfather    Vascular Disease Paternal Grandfather    Breast cancer Maternal Great-grandmother        19s    Social History   Socioeconomic History   Marital status: Married    Spouse name: Engineer, agricultural   Number of children: 3   Years of education: Not on file   Highest education level: Some college, no degree  Occupational History   Not on file  Tobacco Use   Smoking status: Every Day    Packs/day: 1.00    Years: 12.00    Total pack years: 12.00    Types: Cigarettes   Smokeless tobacco: Never  Vaping Use   Vaping Use: Never used  Substance and Sexual Activity   Alcohol use: Yes    Comment: very rare   Drug use: No   Sexual activity: Yes    Birth control/protection: Surgical    Comment: Tuabl ligation  Other Topics Concern   Not on file  Social History Narrative   Not on file   Social Determinants of Health   Financial Resource Strain: High Risk (10/29/2017)   Overall Financial Resource Strain (CARDIA)    Difficulty  of Paying Living Expenses: Very hard  Food Insecurity: Food Insecurity Present (10/29/2017)   Hunger Vital Sign    Worried About Running Out of Food in the Last Year: Often true    Ran Out of Food in the Last Year: Often true  Transportation Needs: No Transportation Needs (10/29/2017)   PRAPARE - Administrator, Civil Service (Medical): No    Lack of Transportation (Non-Medical): No  Physical Activity: Inactive (10/29/2017)   Exercise Vital Sign    Days of Exercise per Week: 0 days    Minutes of Exercise per Session: 0 min  Stress: No Stress Concern Present (10/29/2017)   [employer] of Occupational Health - Occupational Stress Questionnaire    Feeling of  Stress : Not at all  Social Connections: Moderately Integrated (10/29/2017)   Social Connection and Isolation Panel [NHANES]    Frequency of Communication with Friends and Family: More than three times a week    Frequency of Social Gatherings with Friends and Family: Once a week    Attends Religious Services: More than 4 times per year    Active Member of [employer] or Organizations: No    Attends Banker Meetings: Never    Marital Status: Married  Catering manager Violence: Not At Risk (10/29/2017)   Humiliation, Afraid, Rape, and Kick questionnaire    Fear of Current or Ex-Partner: No    Emotionally Abused: No    Physically Abused: No    Sexually Abused: No     Current Outpatient Medications:    fluticasone (FLONASE) 50 MCG/ACT nasal spray, Place 2 sprays into both nostrils daily., Disp: 16 g, Rfl: 12   ipratropium (ATROVENT) 0.06 % nasal spray, Place 2 sprays into both nostrils 4 (four) times daily., Disp: 15 mL, Rfl: 12   levocetirizine (XYZAL) 5 MG tablet, Take 1 tablet (5 mg total) by mouth every evening., Disp: 90 tablet, Rfl: 0   medroxyPROGESTERone (DEPO-PROVERA) 150 MG/ML injection, Inject 1 mL (150 mg total) into the muscle every 3 (three) months., Disp: 1 mL, Rfl: 0   medroxyPROGESTERone (DEPO-PROVERA) 150 MG/ML injection, Inject 1 mL (150 mg total) into the muscle every 3 (three) months., Disp: 1 mL, Rfl: 0   UNABLE TO FIND, Med Name: Sudafed, Disp: , Rfl:      ROS:  Review of Systems BREAST: No symptoms   Objective: There were no vitals taken for this visit.   OBGyn Exam  Results: No results found for this or any previous visit (from the past 24 hour(s)).  Assessment/Plan: No diagnosis found.  No orders of the defined types were placed in this encounter.            GYN counsel {counseling: 16159}     F/U  No follow-ups on file.  Kolbee Stallman B. Elvira Langston, PA-C 03/17/2022 8:40 PM

## 2022-03-19 ENCOUNTER — Ambulatory Visit (INDEPENDENT_AMBULATORY_CARE_PROVIDER_SITE_OTHER): Payer: BC Managed Care – PPO | Admitting: Obstetrics and Gynecology

## 2022-03-19 ENCOUNTER — Encounter: Payer: Self-pay | Admitting: Obstetrics and Gynecology

## 2022-03-19 ENCOUNTER — Other Ambulatory Visit (HOSPITAL_COMMUNITY)
Admission: RE | Admit: 2022-03-19 | Discharge: 2022-03-19 | Disposition: A | Discharge status: Home / Self Care | Payer: BC Managed Care – PPO | Source: Ambulatory Visit | Attending: Obstetrics and Gynecology | Admitting: Obstetrics and Gynecology

## 2022-03-19 VITALS — BP 100/62 | Ht 62.0 in | Wt 123.0 lb

## 2022-03-19 DIAGNOSIS — Z01419 Encounter for gynecological examination (general) (routine) without abnormal findings: Secondary | ICD-10-CM | POA: Diagnosis not present

## 2022-03-19 DIAGNOSIS — Z1151 Encounter for screening for human papillomavirus (HPV): Secondary | ICD-10-CM | POA: Diagnosis not present

## 2022-03-19 DIAGNOSIS — N921 Excessive and frequent menstruation with irregular cycle: Secondary | ICD-10-CM

## 2022-03-19 DIAGNOSIS — Z3042 Encounter for surveillance of injectable contraceptive: Secondary | ICD-10-CM

## 2022-03-19 DIAGNOSIS — Z124 Encounter for screening for malignant neoplasm of cervix: Secondary | ICD-10-CM

## 2022-03-19 DIAGNOSIS — Z113 Encounter for screening for infections with a predominantly sexual mode of transmission: Secondary | ICD-10-CM | POA: Diagnosis not present

## 2022-03-19 NOTE — Patient Instructions (Signed)
I value your feedback and you entrusting us with your care. If you get a [facility] patient survey, I would appreciate you taking the time to let us know about your experience today. Thank you! ? ? ?

## 2022-03-20 LAB — HEPATITIS C ANTIBODY: Hep C Virus Ab: NONREACTIVE

## 2022-03-20 LAB — HIV ANTIBODY (ROUTINE TESTING W REFLEX): HIV Screen 4th Generation wRfx: NONREACTIVE

## 2022-03-20 LAB — RPR: RPR Ser Ql: NONREACTIVE

## 2022-03-21 ENCOUNTER — Encounter: Payer: Self-pay | Admitting: Emergency Medicine

## 2022-03-21 ENCOUNTER — Ambulatory Visit (INDEPENDENT_AMBULATORY_CARE_PROVIDER_SITE_OTHER): Payer: BC Managed Care – PPO

## 2022-03-21 ENCOUNTER — Ambulatory Visit
Admission: EM | Admit: 2022-03-21 | Discharge: 2022-03-21 | Disposition: A | Discharge status: Home / Self Care | Payer: BC Managed Care – PPO | Attending: Family Medicine | Admitting: Family Medicine

## 2022-03-21 DIAGNOSIS — J329 Chronic sinusitis, unspecified: Secondary | ICD-10-CM | POA: Insufficient documentation

## 2022-03-21 DIAGNOSIS — R062 Wheezing: Secondary | ICD-10-CM | POA: Diagnosis not present

## 2022-03-21 DIAGNOSIS — R001 Bradycardia, unspecified: Secondary | ICD-10-CM | POA: Diagnosis not present

## 2022-03-21 DIAGNOSIS — N921 Excessive and frequent menstruation with irregular cycle: Secondary | ICD-10-CM | POA: Insufficient documentation

## 2022-03-21 DIAGNOSIS — Z793 Long term (current) use of hormonal contraceptives: Secondary | ICD-10-CM | POA: Insufficient documentation

## 2022-03-21 DIAGNOSIS — Z7952 Long term (current) use of systemic steroids: Secondary | ICD-10-CM | POA: Insufficient documentation

## 2022-03-21 DIAGNOSIS — J40 Bronchitis, not specified as acute or chronic: Secondary | ICD-10-CM | POA: Diagnosis not present

## 2022-03-21 DIAGNOSIS — R0602 Shortness of breath: Secondary | ICD-10-CM

## 2022-03-21 DIAGNOSIS — F3181 Bipolar II disorder: Secondary | ICD-10-CM | POA: Diagnosis not present

## 2022-03-21 DIAGNOSIS — R059 Cough, unspecified: Secondary | ICD-10-CM | POA: Diagnosis not present

## 2022-03-21 DIAGNOSIS — K219 Gastro-esophageal reflux disease without esophagitis: Secondary | ICD-10-CM | POA: Insufficient documentation

## 2022-03-21 DIAGNOSIS — F17210 Nicotine dependence, cigarettes, uncomplicated: Secondary | ICD-10-CM | POA: Diagnosis not present

## 2022-03-21 DIAGNOSIS — Z20822 Contact with and (suspected) exposure to covid-19: Secondary | ICD-10-CM | POA: Insufficient documentation

## 2022-03-21 DIAGNOSIS — R0789 Other chest pain: Secondary | ICD-10-CM | POA: Diagnosis not present

## 2022-03-21 LAB — BASIC METABOLIC PANEL
Anion gap: 7 (ref 5–15)
BUN: 10 mg/dL (ref 6–20)
CO2: 24 mmol/L (ref 22–32)
Calcium: 9.3 mg/dL (ref 8.9–10.3)
Chloride: 107 mmol/L (ref 98–111)
Creatinine, Ser: 0.87 mg/dL (ref 0.44–1.00)
GFR, Estimated: >60 mL/min (ref >60)
Glucose, Bld: 88 mg/dL (ref 70–99)
Potassium: 3.5 mmol/L (ref 3.5–5.1)
Sodium: 138 mmol/L (ref 135–145)

## 2022-03-21 LAB — TROPONIN I (HIGH SENSITIVITY): Troponin I (High Sensitivity): <2 ng/L (ref <18)

## 2022-03-21 LAB — CBC WITH DIFFERENTIAL/PLATELET
Abs Immature Granulocytes: 0.03 10*3/uL (ref 0.00–0.07)
Basophils Absolute: 0.1 10*3/uL (ref 0.0–0.1)
Basophils Relative: 1 %
Eosinophils Absolute: 0.1 10*3/uL (ref 0.0–0.5)
Eosinophils Relative: 1 %
HCT: 33.9 % — ABNORMAL LOW (ref 36.0–46.0)
Hemoglobin: 11.5 g/dL — ABNORMAL LOW (ref 12.0–15.0)
Immature Granulocytes: 0 %
Lymphocytes Relative: 32 %
Lymphs Abs: 4 10*3/uL (ref 0.7–4.0)
MCH: 33 pg (ref 26.0–34.0)
MCHC: 33.9 g/dL (ref 30.0–36.0)
MCV: 97.4 fL (ref 80.0–100.0)
Monocytes Absolute: 0.5 10*3/uL (ref 0.1–1.0)
Monocytes Relative: 4 %
Neutro Abs: 7.6 10*3/uL (ref 1.7–7.7)
Neutrophils Relative %: 62 %
Platelets: 300 10*3/uL (ref 150–400)
RBC: 3.48 MIL/uL — ABNORMAL LOW (ref 3.87–5.11)
RDW: 13.2 % (ref 11.5–15.5)
WBC: 12.3 10*3/uL — ABNORMAL HIGH (ref 4.0–10.5)
nRBC: 0 % (ref 0.0–0.2)

## 2022-03-21 LAB — SARS CORONAVIRUS 2 BY RT PCR: SARS Coronavirus 2 by RT PCR: NEGATIVE

## 2022-03-21 MED ORDER — FLUCONAZOLE 150 MG PO TABS: 150.0000 mg | ORAL_TABLET | Freq: Every day | ORAL | 0 refills | Status: AC

## 2022-03-21 MED ORDER — PREDNISONE 20 MG PO TABS: 40.0000 mg | ORAL_TABLET | Freq: Every day | ORAL | 0 refills | Status: AC

## 2022-03-21 MED ORDER — ALBUTEROL SULFATE HFA 108 (90 BASE) MCG/ACT IN AERS: 2.0000 | INHALATION_SPRAY | RESPIRATORY_TRACT | 0 refills | Status: DC | PRN

## 2022-03-21 MED ORDER — AZITHROMYCIN 250 MG PO TABS: ORAL_TABLET | ORAL | 0 refills | Status: AC

## 2022-03-21 MED ORDER — AMOXICILLIN 875 MG PO TABS: 875.0000 mg | ORAL_TABLET | Freq: Two times a day (BID) | ORAL | 0 refills | Status: DC

## 2022-03-21 MED ORDER — CEFDINIR 300 MG PO CAPS: 300.0000 mg | ORAL_CAPSULE | Freq: Two times a day (BID) | ORAL | 0 refills | Status: AC

## 2022-03-21 NOTE — ED Triage Notes (Signed)
Pt presents with cough, SOB, wheezing and congestion x 2 weeks.

## 2022-03-21 NOTE — Discharge Instructions (Signed)

## 2022-03-21 NOTE — ED Provider Notes (Signed)
[facility]    CSN: [id number] Arrival date & time: 03/21/22  1400      History   Chief Complaint Chief Complaint  Patient presents with   Shortness of Breath   Cough    HPI Adrienne West is a 31 y.o. female.   HPI   Adrienne West presents for cough and shortness of breath.   Says she has been sick since 02/28/22. She went to [facility] in [place] for possible sinus infection and was given antibiotics. She took amoxicillin for 7 days (took 6 days worth) that didn't help. She started to feel somewhat better but on Tuesday she felt "awful" again. Yesterday, at work she had chest tightness, and difficulty breathing.  It is not uncommon for her to lose her breathe. She felt like she had weight on her chest. She coughed all night to the point that she vomited. She has a persistent hacking cough. Around 7 AM she felt like she had been hit by a truck". She has been wheezing. She felt like she was gasping for air.   She took Delsym stops the cough and Mucinex made her cough more. Has green and yellow sputum. She has not tasted blood or seen blood in her sputum. She has chest tightness and discomfort that worse with cough.  She has had COVID 3 times and this doesn't feel like COVID.    Fever : no  Chills: yes Sore throat: no   Cough: yes Sputum: yes Nasal congestion : no  Rhinorrhea: no Myalgias: no Appetite: normal  Hydration: normal  Abdominal pain: no Nausea: no Vomiting: post-tussive Diarrhea: No Rash: No Sleep disturbance: yes Headache: yes     Past Medical History:  Diagnosis Date   Anemia    Anxiety    Bipolar disorder (HCC)    GERD (gastroesophageal reflux disease)    occ   Headache    migraines    Patient Active Problem List   Diagnosis Date Noted   Bipolar 2 disorder (HCC) 09/26/2020   Tobacco use disorder 09/26/2020   Gastroesophageal reflux disease without esophagitis 09/26/2020   Family history of breast cancer in mother 09/26/2020    Menometrorrhagia 04/29/2018    Past Surgical History:  Procedure Laterality Date   APPENDECTOMY     aspiration of breast cyst Right 2013   CESAREAN SECTION N/A 10/02/2017   Procedure: CESAREAN SECTION;  Surgeon: Conard Novak, MD;  Location: [facility] ORS;  Service: Obstetrics;  Laterality: N/A;   LAPAROSCOPIC APPENDECTOMY N/A 2012   LAPAROSCOPIC BILATERAL SALPINGECTOMY N/A 11/18/2017   Procedure: LAPAROSCOPIC PARTIAL SALPINGECTOMY;  Surgeon: Nadara Mustard, MD;  Location: [facility] OR[facility];  Service: Gynecology;  Laterality: N/A;   TUBAL LIGATION     WISDOM TOOTH EXTRACTION      OB History     Gravida  3   Para  3   Term  2   Preterm  1   AB  0   Living  3      SAB  0   IAB  0   Ectopic  0   Multiple  0   Live Births  3            Home Medications    Prior to Admission medications   Medication Sig Start Date End Date Taking? Authorizing Provider  albuterol (VENTOLIN HFA) 108 (90 Base) MCG/ACT inhaler Inhale 2 puffs into the lungs every 4 (four) hours as needed for wheezing or shortness of breath. 03/21/22  Yes  Tyanna Hach, DO  azithromycin (ZITHROMAX Z-PAK) 250 MG tablet Take 2 tablets (500 mg total) by mouth daily for 1 day, THEN 1 tablet (250 mg total) daily for 4 days. 03/21/22 03/26/22 Yes Cavan Bearden, DO  cefdinir (OMNICEF) 300 MG capsule Take 1 capsule (300 mg total) by mouth 2 (two) times daily for 10 days. 03/21/22 03/31/22 Yes Anam Bobby, DO  fluconazole (DIFLUCAN) 150 MG tablet Take 1 tablet (150 mg total) by mouth daily for 2 doses. 03/28/22 03/30/22 Yes Cierra Rothgeb, DO  predniSONE (DELTASONE) 20 MG tablet Take 2 tablets (40 mg total) by mouth daily for 5 days. 03/21/22 03/26/22 Yes Royal Beirne, Seward Meth, DO  levocetirizine (XYZAL) 5 MG tablet Take 1 tablet (5 mg total) by mouth every evening. 07/05/21   Wallis Bamberg, PA-C  medroxyPROGESTERone (DEPO-PROVERA) 150 MG/ML injection Inject 1 mL (150 mg total) into the muscle every 3 (three) months. Patient  not taking: Reported on 03/19/2022 12/25/21   Copland, Ilona Sorrel, PA-C    Family History Family History  Problem Relation Age of Onset   Bipolar disorder Mother    Endometriosis Mother    Breast cancer Mother 60   Anxiety disorder Mother    Depression Mother    Stroke Father    Hyperlipidemia Father    Hypertension Father    Bipolar disorder Sister    Anxiety disorder Sister    Depression Sister    COPD Maternal Grandmother    Hypothyroidism Maternal Grandmother    Diabetes Maternal Grandfather    Other Maternal Grandfather        Hepatic Encepholapathy   Rheum arthritis Paternal Grandmother    Liver disease Paternal Grandmother    AAA (abdominal aortic aneurysm) Paternal Grandfather    Stroke Paternal Grandfather    Vascular Disease Paternal Grandfather    Breast cancer Maternal Great-grandmother        20s    Social History Social History   Tobacco Use   Smoking status: Every Day    Packs/day: 1.00    Years: 12.00    Total pack years: 12.00    Types: Cigarettes   Smokeless tobacco: Never  Vaping Use   Vaping Use: Never used  Substance Use Topics   Alcohol use: Yes    Comment: very rare   Drug use: No     Allergies   Hydrocodone and Tegaderm ag mesh [silver]   Review of Systems Review of Systems: negative unless otherwise stated in HPI.      Physical Exam Triage Vital Signs ED Triage Vitals  Enc Vitals Group     BP 03/21/22 1422 110/76     Pulse Rate 03/21/22 1422 61     Resp 03/21/22 1422 16     Temp 03/21/22 1422 98.7 F (37.1 C)     Temp Source 03/21/22 1422 Oral     SpO2 03/21/22 1425 95 %     Weight --      Height --      Head Circumference --      Peak Flow --      Pain Score 03/21/22 1421 7     Pain Loc --      Pain Edu? --      Excl. in [place]? --    No data found.  Updated Vital Signs BP 110/76 (BP Location: Left Arm)   Pulse 61   Temp 98.7 F (37.1 C) (Oral)   Resp 16   SpO2 95%   Visual Acuity Right Eye Distance:  Left  Eye Distance:   Bilateral Distance:    Right Eye Near:   Left Eye Near:    Bilateral Near:     Physical Exam GEN:     alert, non-toxic appearing female in no distress    HENT:  mucus membranes moist, oropharyngeal without lesions or exudate, no tonsillar hypertrophy,  moderate oropharyngeal erythema ,  moderate erythematous hypertrophied turbinates, clear nasal discharge, right TM erythematous and opaque, left TM normal, bilateral maxillary sinus tenderness  EYES:   pupils equal and reactive, EOMi, no scleral injection NECK:  normal ROM RESP:  no increased work of breathing, clear to auscultation bilaterally CVS:   regular rate and rhythm Skin:   warm and dry    UC Treatments / Results  Labs (all labs ordered are listed, but only abnormal results are displayed) Labs Reviewed  CBC WITH DIFFERENTIAL/PLATELET - Abnormal; Notable for the following components:      Result Value   WBC 12.3 (*)    RBC 3.48 (*)    Hemoglobin 11.5 (*)    HCT 33.9 (*)    All other components within normal limits  SARS CORONAVIRUS 2 BY RT PCR  BASIC METABOLIC PANEL  TROPONIN I (HIGH SENSITIVITY)    EKG   Radiology DG Chest 2 View  Result Date: 03/21/2022 CLINICAL DATA:  Shortness of breath EXAM: CHEST - 2 VIEW COMPARISON:  08/07/2016 FINDINGS: Cardiomediastinal silhouette is within normal limits. There is no focal airspace consolidation. There is no pleural effusion. No pneumothorax. There is no acute osseous abnormality. Mild thoracic spondylosis. IMPRESSION: No evidence of acute cardiopulmonary disease. Electronically Signed   By: Caprice Renshaw M.D.   On: 03/21/2022 15:14    Procedures Procedures (including critical care time)  Medications Ordered in UC Medications - No data to display  Initial Impression / Assessment and Plan / UC Course  I have reviewed the triage vital signs and the nursing notes.  Pertinent labs & imaging results that were available during my care of the patient were  reviewed by me and considered in my medical decision making (see chart for details).       Pt is a 31 y.o. female who presents for 2 weeks of respiratory symptoms and chest tightness. Vianna is afebrile here without recent antipyretics. Satting adequately (93-96%), on room air.  She is bradycardic but this is not new as seen by evidence of EKG in October 2018.  Overall pt is well hydrated, without respiratory distress. Pulmonary exam is unremarkable. She has evidence of AOM in her right ear, bilateral maxillary sinus tenderness.  COVID testing obtained and was negative.  She has no leukocytosis without left shift and mild anemia.  BMP is unremarkable.  EKG, sinus bradycardia without acute ST or T wave changes, personally reviewed by me.  There is family history of blood clots. She has no personal history of DVT or PE. High-sensitivity troponin was unremarkable.  Chest x-ray unremarkable for pleural effusions, pneumothorax, focal pneumonia.  She does smoke but denies history of COPD or asthma.  I doubt that she has a cardiac cause to her symptoms.  Based on history, I suspect this is bacterial conversion of a viral illness. She had amoxicillin earlier in the disease process that did not help. Treat with cefdinir for 10 days to cover for ENT infection and azithromycin for atypical bronchitis. Given steroid burst and albuterol. Prescriptions sent to pharmacy. Discussed typical duration of symptoms. OTC symptom care as needed. Ensure adequate fluid intake and  rest.  Work note provided.  Reviewed expectations re: course of current medical issues. Questions answered. Return and ED precautions given.  Patient verbalized understanding. After Visit Summary given.  Discussed MDM, treatment plan and plan for follow-up with patient who agrees with plan.     Final Clinical Impressions(s) / UC Diagnoses   Final diagnoses:  Sinobronchitis     Discharge Instructions      Stop by the pharmacy to pick up your  prescriptions.  Follow up with your primary care provider as needed.    Go to ED for red flag symptoms, including; fevers you cannot reduce with Tylenol/Motrin, severe headaches, vision changes, numbness/weakness in part of the body, lethargy, confusion, intractable vomiting, severe dehydration, chest pain, breathing difficulty, severe persistent abdominal or pelvic pain, signs of severe infection (increased redness, swelling of an area), feeling faint or passing out, dizziness, etc. You should especially go to the ED for sudden acute worsening of condition if you do not elect to go at this time.       ED Prescriptions     Medication Sig Dispense Auth. Provider   azithromycin (ZITHROMAX Z-PAK) 250 MG tablet Take 2 tablets (500 mg total) by mouth daily for 1 day, THEN 1 tablet (250 mg total) daily for 4 days. 6 tablet Azul Brumett, DO   cefdinir (OMNICEF) 300 MG capsule Take 1 capsule (300 mg total) by mouth 2 (two) times daily for 10 days. 20 capsule Reegan Bouffard, DO   fluconazole (DIFLUCAN) 150 MG tablet Take 1 tablet (150 mg total) by mouth daily for 2 doses. 2 tablet Zion Ta, DO   predniSONE (DELTASONE) 20 MG tablet Take 2 tablets (40 mg total) by mouth daily for 5 days. 10 tablet Katha Cabal, DO      PDMP not reviewed this encounter.   Katha Cabal, DO 03/21/22 1642

## 2022-03-26 ENCOUNTER — Emergency Department
Admission: EM | Admit: 2022-03-26 | Discharge: 2022-03-26 | Disposition: A | Discharge status: Home / Self Care | Payer: BC Managed Care – PPO | Attending: Emergency Medicine | Admitting: Emergency Medicine

## 2022-03-26 ENCOUNTER — Emergency Department: Payer: BC Managed Care – PPO

## 2022-03-26 ENCOUNTER — Other Ambulatory Visit: Payer: Self-pay

## 2022-03-26 DIAGNOSIS — R0602 Shortness of breath: Secondary | ICD-10-CM | POA: Insufficient documentation

## 2022-03-26 DIAGNOSIS — R0789 Other chest pain: Secondary | ICD-10-CM | POA: Insufficient documentation

## 2022-03-26 DIAGNOSIS — R531 Weakness: Secondary | ICD-10-CM | POA: Insufficient documentation

## 2022-03-26 DIAGNOSIS — D72829 Elevated white blood cell count, unspecified: Secondary | ICD-10-CM | POA: Diagnosis not present

## 2022-03-26 DIAGNOSIS — F17200 Nicotine dependence, unspecified, uncomplicated: Secondary | ICD-10-CM | POA: Insufficient documentation

## 2022-03-26 DIAGNOSIS — R079 Chest pain, unspecified: Secondary | ICD-10-CM | POA: Diagnosis not present

## 2022-03-26 LAB — TROPONIN I (HIGH SENSITIVITY)
Troponin I (High Sensitivity): <2 ng/L (ref <18)
Troponin I (High Sensitivity): <2 ng/L (ref <18)

## 2022-03-26 LAB — CBC
HCT: 34.5 % — ABNORMAL LOW (ref 36.0–46.0)
Hemoglobin: 11.7 g/dL — ABNORMAL LOW (ref 12.0–15.0)
MCH: 33.1 pg (ref 26.0–34.0)
MCHC: 33.9 g/dL (ref 30.0–36.0)
MCV: 97.7 fL (ref 80.0–100.0)
Platelets: 320 10*3/uL (ref 150–400)
RBC: 3.53 MIL/uL — ABNORMAL LOW (ref 3.87–5.11)
RDW: 13.4 % (ref 11.5–15.5)
WBC: 15 10*3/uL — ABNORMAL HIGH (ref 4.0–10.5)
nRBC: 0 % (ref 0.0–0.2)

## 2022-03-26 LAB — BASIC METABOLIC PANEL
Anion gap: 9 (ref 5–15)
BUN: 12 mg/dL (ref 6–20)
CO2: 23 mmol/L (ref 22–32)
Calcium: 9.1 mg/dL (ref 8.9–10.3)
Chloride: 109 mmol/L (ref 98–111)
Creatinine, Ser: 0.92 mg/dL (ref 0.44–1.00)
GFR, Estimated: >60 mL/min (ref >60)
Glucose, Bld: 108 mg/dL — ABNORMAL HIGH (ref 70–99)
Potassium: 3.6 mmol/L (ref 3.5–5.1)
Sodium: 141 mmol/L (ref 135–145)

## 2022-03-26 LAB — D-DIMER, QUANTITATIVE: D-Dimer, Quant: 0.38 ug/mL-FEU (ref 0.00–0.50)

## 2022-03-26 MED ORDER — SODIUM CHLORIDE 0.9 % IV BOLUS
1000.0000 mL | Freq: Once | INTRAVENOUS | Status: AC
  Administered 2022-03-26: 1000 mL via INTRAVENOUS

## 2022-03-26 NOTE — ED Triage Notes (Signed)
Since august sob, cp per states she has been seen multiple times for viral illness?, pt states she has been taking oral steroids and antibiotics

## 2022-03-26 NOTE — ED Provider Notes (Signed)
[facility] Provider Note  Patient Contact: 3:27 PM (approximate)   History   Chest Pain (Cp and sob started x 30 days )   HPI  Adrienne West is a 31 y.o. female with an extensive family history of PEs, presents to the emergency department with a sensation of shortness of breath, weakness and nonspecific chest pain that radiates to the back.  Patient states that her symptoms started in late August.  She states that she initially started a course of amoxicillin but did not improve.  She revisited in urgent care during the second week of September and was started on cefdinir for an ear infection and was started on prednisone.  Patient states that she is on the last day of prednisone today.  She states that she asked her husband to come pick her up as she feels weak and lethargic.  She denies fever or chills.  No nausea, vomiting or diarrhea.      Physical Exam   Triage Vital Signs: ED Triage Vitals  Enc Vitals Group     BP 03/26/22 1336 121/81     Pulse Rate 03/26/22 1336 72     Resp 03/26/22 1336 17     Temp 03/26/22 1336 98.2 F (36.8 C)     Temp Source 03/26/22 1336 Oral     SpO2 03/26/22 1336 96 %     Weight 03/26/22 1337 121 lb 4.1 oz (55 kg)     Height 03/26/22 1337 5\' 2"  (1.575 m)     Head Circumference --      Peak Flow --      Pain Score 03/26/22 1337 2     Pain Loc --      Pain Edu? --      Excl. in [place]? --     Most recent vital signs: Vitals:   03/26/22 1512 03/26/22 1756  BP: 120/78   Pulse: 70   Resp: 16   Temp:  97.6 F (36.4 C)  SpO2: 98%      General: Alert and in no acute distress. Eyes:  PERRL. EOMI. Head: No acute traumatic findings ENT:      Nose: No congestion/rhinnorhea.      Mouth/Throat: Mucous membranes are moist. Neck: No stridor. No cervical spine tenderness to palpation. Cardiovascular:  Good peripheral perfusion Respiratory: Normal respiratory effort without tachypnea or retractions. Lungs CTAB. Good  air entry to the bases with no decreased or absent breath sounds. Gastrointestinal: Bowel sounds 4 quadrants. Soft and nontender to palpation. No guarding or rigidity. No palpable masses. No distention. No CVA tenderness. Musculoskeletal: Full range of motion to all extremities.  Neurologic:  No gross focal neurologic deficits are appreciated.  Skin:   No rash noted Other:   ED Results / Procedures / Treatments   Labs (all labs ordered are listed, but only abnormal results are displayed) Labs Reviewed  BASIC METABOLIC PANEL - Abnormal; Notable for the following components:      Result Value   Glucose, Bld 108 (*)    All other components within normal limits  CBC - Abnormal; Notable for the following components:   WBC 15.0 (*)    RBC 3.53 (*)    Hemoglobin 11.7 (*)    HCT 34.5 (*)    All other components within normal limits  D-DIMER, QUANTITATIVE  POC URINE PREG, ED  TROPONIN I (HIGH SENSITIVITY)  TROPONIN I (HIGH SENSITIVITY)     EKG  Normal sinus rhythm without ST  segment elevation or other apparent arrhythmia.   RADIOLOGY  I personally viewed and evaluated these images as part of my medical decision making, as well as reviewing the written report by the radiologist.  ED Provider Interpretation: No consolidations, opacities or infiltrates.  No evidence of pneumothorax or pneumomediastinum.   PROCEDURES:  Critical Care performed: No  Procedures   MEDICATIONS ORDERED IN ED: Medications  sodium chloride 0.9 % bolus 1,000 mL (0 mLs Intravenous Stopped 03/26/22 1755)     IMPRESSION / MDM / ASSESSMENT AND PLAN / ED COURSE  I reviewed the triage vital signs and the nursing notes.                              Assessment and plan:  Chest pain Shortness of breath 31 year old female presents to the emergency department with approximately 3 to 4 weeks of illness with recent development of nonspecific chest pain, shortness of breath, weakness and pain that radiates  to her back.  Differential diagnosis included community-acquired pneumonia, electrolyte abnormality, bronchitis, PE...  Patient has mild elevation of her white blood cell count of 15.  Initial troponin within range.  BMP within range.  No consolidations, opacities or infiltrates on chest x-ray to suggest pneumonia.  PERC score 0.  Given that patient is a daily smoker, uses hormonal contraceptives and has an extensive family history of PEs, will obtain D-dimer and will reassess.  D-dimer within range.  Second troponin within range.  Recommended finishing prednisone.  I do suspect that some of patient's fatigue is from prednisone withdrawal as she is on her last day of medication.  Normal saline bolus was given in the emergency department.  Return precautions were given to return with new or worsening symptoms.  All patient questions were answered.     FINAL CLINICAL IMPRESSION(S) / ED DIAGNOSES   Final diagnoses:  Other chest pain     Rx / DC Orders   ED Discharge Orders     None        Note:  This document was prepared using Dragon voice recognition software and may include unintentional dictation errors.   Pia Mau Friona, PA-C 03/26/22 [name]    Sharman Cheek, MD 03/26/22 [phone]

## 2022-03-27 LAB — CYTOLOGY - PAP
Chlamydia: NEGATIVE
Comment: NEGATIVE
Comment: NEGATIVE
Comment: NEGATIVE
Comment: NORMAL
Diagnosis: UNDETERMINED — AB
High risk HPV: NEGATIVE
Neisseria Gonorrhea: NEGATIVE
Trichomonas: NEGATIVE

## 2022-04-18 ENCOUNTER — Ambulatory Visit
Admission: EM | Admit: 2022-04-18 | Discharge: 2022-04-18 | Disposition: A | Discharge status: Home / Self Care | Payer: BC Managed Care – PPO | Attending: Family Medicine | Admitting: Family Medicine

## 2022-04-18 ENCOUNTER — Encounter: Payer: Self-pay | Admitting: Emergency Medicine

## 2022-04-18 DIAGNOSIS — Z20822 Contact with and (suspected) exposure to covid-19: Secondary | ICD-10-CM | POA: Diagnosis not present

## 2022-04-18 DIAGNOSIS — J069 Acute upper respiratory infection, unspecified: Secondary | ICD-10-CM | POA: Diagnosis not present

## 2022-04-18 LAB — RESP PANEL BY RT-PCR (FLU A&B, COVID) ARPGX2
Influenza A by PCR: NEGATIVE
Influenza B by PCR: NEGATIVE
SARS Coronavirus 2 by RT PCR: NEGATIVE

## 2022-04-18 LAB — GROUP A STREP BY PCR: Group A Strep by PCR: NOT DETECTED

## 2022-04-18 MED ORDER — ALBUTEROL SULFATE HFA 108 (90 BASE) MCG/ACT IN AERS: 2.0000 | INHALATION_SPRAY | RESPIRATORY_TRACT | 0 refills | Status: DC | PRN

## 2022-04-18 MED ORDER — PROMETHAZINE-DM 6.25-15 MG/5ML PO SYRP: 5.0000 mL | ORAL_SOLUTION | Freq: Four times a day (QID) | ORAL | 0 refills | Status: DC | PRN

## 2022-04-18 NOTE — ED Triage Notes (Signed)
Pt c/o nasal congestion, sore throat, cough, and left ear pain. Started about 2 days ago. She states 2 of her kids have strep throat. She states the cough is keeping her up at night. Denies fever.

## 2022-04-18 NOTE — Discharge Instructions (Addendum)
Stop by the pharmacy to pick up your prescriptions.  Follow up with your primary care provider as needed.  Your Strep, COVID and flu tests were negative.    You can take Tylenol and/or Ibuprofen as needed for fever reduction and pain relief.    For cough: honey 1/2 to 1 teaspoon (you can dilute the honey in water or another fluid).  You can also use guaifenesin and dextromethorphan for cough. You can use a humidifier for chest congestion and cough.  If you don't have a humidifier, you can sit in the bathroom with the hot shower running.      For sore throat: try warm salt water gargles, cepacol lozenges, throat spray, warm tea or water with lemon/honey, popsicles or ice, or OTC cold relief medicine for throat discomfort.    For congestion: take a daily anti-histamine like Zyrtec, Claritin, and a oral decongestant, such as pseudoephedrine.  You can also use Flonase 1-2 sprays in each nostril daily.    It is important to stay hydrated: drink plenty of fluids (water, gatorade/powerade/pedialyte, juices, or teas) to keep your throat moisturized and help further relieve irritation/discomfort.    Return or go to the Emergency Department if symptoms worsen or do not improve in the next few days

## 2022-04-18 NOTE — ED Provider Notes (Signed)
[facility]    CSN: [id number] Arrival date & time: 04/18/22  1115      History   Chief Complaint Chief Complaint  Patient presents with   Sore Throat    HPI Adrienne West is a 31 y.o. female.   HPI   Adrienne West presents for sore throat, chills, cough, myalgias, ear pain. She has left sided facial pain and sinus pressure. Symptoms started 2 days ago. She has "coughed until I have peed my pants", vomited and left work Occupational hygienist. Has been using albuterol inhaler more frequently. She has used Mucinex Max that helped. She was previously unable to sleep. Slept 4 hours last night. She has 2 kids in public schools who have strep.       Past Medical History:  Diagnosis Date   Anemia    Anxiety    Bipolar disorder (HCC)    GERD (gastroesophageal reflux disease)    occ   Headache    migraines    Patient Active Problem List   Diagnosis Date Noted   Bipolar 2 disorder (HCC) 09/26/2020   Tobacco use disorder 09/26/2020   Gastroesophageal reflux disease without esophagitis 09/26/2020   Family history of breast cancer in mother 09/26/2020   Menometrorrhagia 04/29/2018    Past Surgical History:  Procedure Laterality Date   APPENDECTOMY     aspiration of breast cyst Right 2013   CESAREAN SECTION N/A 10/02/2017   Procedure: CESAREAN SECTION;  Surgeon: Conard Novak, MD;  Location: [facility] OR[facility];  Service: Obstetrics;  Laterality: N/A;   LAPAROSCOPIC APPENDECTOMY N/A 2012   LAPAROSCOPIC BILATERAL SALPINGECTOMY N/A 11/18/2017   Procedure: LAPAROSCOPIC PARTIAL SALPINGECTOMY;  Surgeon: Nadara Mustard, MD;  Location: [facility] OR[facility];  Service: Gynecology;  Laterality: N/A;   TUBAL LIGATION     WISDOM TOOTH EXTRACTION      OB History     Gravida  3   Para  3   Term  2   Preterm  1   AB  0   Living  3      SAB  0   IAB  0   Ectopic  0   Multiple  0   Live Births  3            Home Medications    Prior to Admission medications   Medication Sig  Start Date End Date Taking? Authorizing Provider  promethazine-dextromethorphan (PROMETHAZINE-DM) 6.25-15 MG/5ML syrup Take 5 mLs by mouth 4 (four) times daily as needed. 04/18/22  Yes Cherolyn Behrle, DO  albuterol (VENTOLIN HFA) 108 (90 Base) MCG/ACT inhaler Inhale 2 puffs into the lungs every 4 (four) hours as needed for wheezing or shortness of breath. 04/18/22   Roby Donaway, Seward Meth, DO  levocetirizine (XYZAL) 5 MG tablet Take 1 tablet (5 mg total) by mouth every evening. 07/05/21   Wallis Bamberg, PA-C  medroxyPROGESTERone (DEPO-PROVERA) 150 MG/ML injection Inject 1 mL (150 mg total) into the muscle every 3 (three) months. Patient not taking: Reported on 03/19/2022 12/25/21   Copland, Ilona Sorrel, PA-C    Family History Family History  Problem Relation Age of Onset   Bipolar disorder Mother    Endometriosis Mother    Breast cancer Mother 33   Anxiety disorder Mother    Depression Mother    Stroke Father    Hyperlipidemia Father    Hypertension Father    Bipolar disorder Sister    Anxiety disorder Sister    Depression Sister    COPD Maternal Grandmother  Hypothyroidism Maternal Grandmother    Diabetes Maternal Grandfather    Other Maternal Grandfather        Hepatic Encepholapathy   Rheum arthritis Paternal Grandmother    Liver disease Paternal Grandmother    AAA (abdominal aortic aneurysm) Paternal Grandfather    Stroke Paternal Grandfather    Vascular Disease Paternal Grandfather    Breast cancer Maternal Great-grandmother        65s    Social History Social History   Tobacco Use   Smoking status: Every Day    Packs/day: 1.00    Years: 12.00    Total pack years: 12.00    Types: Cigarettes   Smokeless tobacco: Never  Vaping Use   Vaping Use: Never used  Substance Use Topics   Alcohol use: Yes    Comment: very rare   Drug use: No     Allergies   Hydrocodone and Tegaderm ag mesh [silver]   Review of Systems Review of Systems: negative unless otherwise stated in  HPI.      Physical Exam Triage Vital Signs ED Triage Vitals  Enc Vitals Group     BP 04/18/22 1148 112/74     Pulse Rate 04/18/22 1148 61     Resp 04/18/22 1148 18     Temp 04/18/22 1148 98.6 F (37 C)     Temp Source 04/18/22 1148 Oral     SpO2 04/18/22 1148 99 %     Weight 04/18/22 1146 121 lb 4.1 oz (55 kg)     Height 04/18/22 1146 5\' 2"  (1.575 m)     Head Circumference --      Peak Flow --      Pain Score 04/18/22 1146 6     Pain Loc --      Pain Edu? --      Excl. in [place]? --    No data found.  Updated Vital Signs BP 112/74 (BP Location: Left Arm)   Pulse 61   Temp 98.6 F (37 C) (Oral)   Resp 18   Ht 5\' 2"  (1.575 m)   Wt 55 kg   LMP 04/10/2022 (Approximate)   SpO2 99%   BMI 22.18 kg/m   Visual Acuity Right Eye Distance:   Left Eye Distance:   Bilateral Distance:    Right Eye Near:   Left Eye Near:    Bilateral Near:     Physical Exam GEN:     alert, non-toxic appearing female in no distress     HENT:  mucus membranes moist, oropharyngeal  without lesions or  exudate, no  tonsillar hypertrophy,   mild oropharyngeal erythema, no nasal discharge,  bilateral TM  normal EYES:   pupils equal and reactive, EOMi ,  no scleral injection NECK:  normal ROM, no meningismus   RESP:  no increased work of breathing,  clear to auscultation bilaterally CVS:   regular rate  and rhythm Skin:   warm and dry, no rash on visible skin , normal  skin turgor    UC Treatments / Results  Labs (all labs ordered are listed, but only abnormal results are displayed) Labs Reviewed  GROUP A STREP BY PCR  RESP PANEL BY RT-PCR (FLU A&B, COVID) [id number]    EKG   Radiology No results found.  Procedures Procedures (including critical care time)  Medications Ordered in UC Medications - No data to display  Initial Impression / Assessment and Plan / UC Course  I have reviewed the triage vital signs  and the nursing notes.  Pertinent labs & imaging results that were available  during my care of the patient were reviewed by me and considered in my medical decision making (see chart for details).       Pt is a 31 y.o. female who presents for 2 days of respiratory symptoms. Adrienne West is  afebrile here without recent antipyretics. Satting well on room air. Overall pt is  well appearing, well hydrated, without respiratory distress. Pulmonary exam  is unremarkable.  COVID  and influenza testing and was negative. Strep PCR negative. History consistent with  viral respiratory illness. Discussed symptomatic treatment.  Explained lack of efficacy of antibiotics in viral disease.  Typical duration of symptoms discussed.  Return and ED precautions given and patient/parent  voiced understanding. - continue age-appropriate Tylenol/  Motrin as needed for discomfort/fever - nasal saline to help with his nasal congestion - Use a mist humidifier to help with breathing - Stressed importance of hydration - Albuterol to help with chest tightness    - Work  note provided, per pt request  - Discussed return and ED precautions, understanding voiced.   Discussed MDM, treatment plan and plan for follow-up with patient  who agrees with plan.     Final Clinical Impressions(s) / UC Diagnoses   Final diagnoses:  Viral URI with cough  Encounter for laboratory testing for COVID-19 virus     Discharge Instructions      Stop by the pharmacy to pick up your prescriptions.  Follow up with your primary care provider as needed.  Your Strep, COVID and flu tests were negative.    You can take Tylenol and/or Ibuprofen as needed for fever reduction and pain relief.    For cough: honey 1/2 to 1 teaspoon (you can dilute the honey in water or another fluid).  You can also use guaifenesin and dextromethorphan for cough. You can use a humidifier for chest congestion and cough.  If you don't have a humidifier, you can sit in the bathroom with the hot shower running.      For sore throat: try warm salt  water gargles, cepacol lozenges, throat spray, warm tea or water with lemon/honey, popsicles or ice, or OTC cold relief medicine for throat discomfort.    For congestion: take a daily anti-histamine like Zyrtec, Claritin, and a oral decongestant, such as pseudoephedrine.  You can also use Flonase 1-2 sprays in each nostril daily.    It is important to stay hydrated: drink plenty of fluids (water, gatorade/powerade/pedialyte, juices, or teas) to keep your throat moisturized and help further relieve irritation/discomfort.    Return or go to the Emergency Department if symptoms worsen or do not improve in the next few days      ED Prescriptions     Medication Sig Dispense Auth. Provider   promethazine-dextromethorphan (PROMETHAZINE-DM) 6.25-15 MG/5ML syrup Take 5 mLs by mouth 4 (four) times daily as needed. 118 mL Yurani Fettes, DO   albuterol (VENTOLIN HFA) 108 (90 Base) MCG/ACT inhaler Inhale 2 puffs into the lungs every 4 (four) hours as needed for wheezing or shortness of breath. 6.7 g Katha Cabal, DO      PDMP not reviewed this encounter.   Katha Cabal, DO 04/18/22 1252

## 2022-04-29 ENCOUNTER — Encounter: Payer: Self-pay | Admitting: Internal Medicine

## 2022-04-29 ENCOUNTER — Ambulatory Visit: Payer: BC Managed Care – PPO | Admitting: Internal Medicine

## 2022-04-29 VITALS — BP 112/84 | HR 61 | Ht 62.0 in | Wt 125.9 lb

## 2022-04-29 DIAGNOSIS — K219 Gastro-esophageal reflux disease without esophagitis: Secondary | ICD-10-CM

## 2022-04-29 DIAGNOSIS — F3181 Bipolar II disorder: Secondary | ICD-10-CM

## 2022-04-29 DIAGNOSIS — F17200 Nicotine dependence, unspecified, uncomplicated: Secondary | ICD-10-CM | POA: Diagnosis not present

## 2022-04-29 DIAGNOSIS — N921 Excessive and frequent menstruation with irregular cycle: Secondary | ICD-10-CM | POA: Diagnosis not present

## 2022-04-29 DIAGNOSIS — J4520 Mild intermittent asthma, uncomplicated: Secondary | ICD-10-CM

## 2022-04-29 NOTE — Assessment & Plan Note (Signed)
Reflux is stable. 

## 2022-04-29 NOTE — Assessment & Plan Note (Signed)
Patient is anemic she was advised to take iron pills every day she has been seen by gynecologist

## 2022-04-29 NOTE — Progress Notes (Signed)
Established Patient Office Visit  Subjective:  Patient ID: Adrienne West, female    DOB: [birth year]  Age: 31 y.o. MRN: [id number]  CC:  Chief Complaint  Patient presents with   New Patient (Initial Visit)    Patient has had respiratory issues since August and has had 3 different antibiotics, but still having issues.     HPI  Adrienne West presents for general check  Past Medical History:  Diagnosis Date   Anemia    Anxiety    Bipolar disorder ([place])    GERD (gastroesophageal reflux disease)    occ   Headache    migraines    Past Surgical History:  Procedure Laterality Date   APPENDECTOMY     aspiration of breast cyst Right 2013   CESAREAN SECTION N/A 10/02/2017   Procedure: CESAREAN SECTION;  Surgeon: Will Bonnet, MD;  Location: [facility] OR[facility];  Service: Obstetrics;  Laterality: N/A;   LAPAROSCOPIC APPENDECTOMY N/A 2012   LAPAROSCOPIC BILATERAL SALPINGECTOMY N/A 11/18/2017   Procedure: LAPAROSCOPIC PARTIAL SALPINGECTOMY;  Surgeon: Gae Dry, MD;  Location: [facility] OR[facility];  Service: Gynecology;  Laterality: N/A;   TUBAL LIGATION     WISDOM TOOTH EXTRACTION      Family History  Problem Relation Age of Onset   Bipolar disorder Mother    Endometriosis Mother    Breast cancer Mother 4   Anxiety disorder Mother    Depression Mother    Stroke Father    Hyperlipidemia Father    Hypertension Father    Bipolar disorder Sister    Anxiety disorder Sister    Depression Sister    COPD Maternal Grandmother    Hypothyroidism Maternal Grandmother    Diabetes Maternal Grandfather    Other Maternal Grandfather        Hepatic Encepholapathy   Rheum arthritis Paternal Grandmother    Liver disease Paternal Grandmother    AAA (abdominal aortic aneurysm) Paternal Grandfather    Stroke Paternal Grandfather    Vascular Disease Paternal Grandfather    Breast cancer Maternal Great-grandmother        56s    Social History   Socioeconomic History   Marital status:  Married    Spouse name: Facilities manager   Number of children: 3   Years of education: Not on file   Highest education level: Some college, no degree  Occupational History   Not on file  Tobacco Use   Smoking status: Every Day    Packs/day: 1.00    Years: 12.00    Total pack years: 12.00    Types: Cigarettes   Smokeless tobacco: Never  Vaping Use   Vaping Use: Never used  Substance and Sexual Activity   Alcohol use: Yes    Comment: very rare   Drug use: No   Sexual activity: Yes    Birth control/protection: Surgical    Comment: Tuabl ligation  Other Topics Concern   Not on file  Social History Narrative   Not on file   Social Determinants of Health   Financial Resource Strain: High Risk (10/29/2017)   Overall Financial Resource Strain (CARDIA)    Difficulty of Paying Living Expenses: Very hard  Food Insecurity: Food Insecurity Present (10/29/2017)   Hunger Vital Sign    Worried About Running Out of Food in the Last Year: Often true    Ran Out of Food in the Last Year: Often true  Transportation Needs: No Transportation Needs (10/29/2017)   PRAPARE - Transportation  Lack of Transportation (Medical): No    Lack of Transportation (Non-Medical): No  Physical Activity: Inactive (10/29/2017)   Exercise Vital Sign    Days of Exercise per Week: 0 days    Minutes of Exercise per Session: 0 min  Stress: No Stress Concern Present (10/29/2017)   [employer] of Occupational Health - Occupational Stress Questionnaire    Feeling of Stress : Not at all  Social Connections: Moderately Integrated (10/29/2017)   Social Connection and Isolation Panel [NHANES]    Frequency of Communication with Friends and Family: More than three times a week    Frequency of Social Gatherings with Friends and Family: Once a week    Attends Religious Services: More than 4 times per year    Active Member of [employer] or Organizations: No    Attends Banker Meetings: Never    Marital Status: Married   Catering manager Violence: Not At Risk (10/29/2017)   Humiliation, Afraid, Rape, and Kick questionnaire    Fear of Current or Ex-Partner: No    Emotionally Abused: No    Physically Abused: No    Sexually Abused: No     Current Outpatient Medications:    albuterol (VENTOLIN HFA) 108 (90 Base) MCG/ACT inhaler, Inhale 2 puffs into the lungs every 4 (four) hours as needed for wheezing or shortness of breath., Disp: 6.7 g, Rfl: 0   levocetirizine (XYZAL) 5 MG tablet, Take 1 tablet (5 mg total) by mouth every evening., Disp: 90 tablet, Rfl: 0   medroxyPROGESTERone (DEPO-PROVERA) 150 MG/ML injection, Inject 1 mL (150 mg total) into the muscle every 3 (three) months. (Patient not taking: Reported on 03/19/2022), Disp: 1 mL, Rfl: 0   promethazine-dextromethorphan (PROMETHAZINE-DM) 6.25-15 MG/5ML syrup, Take 5 mLs by mouth 4 (four) times daily as needed., Disp: 118 mL, Rfl: 0   Allergies  Allergen Reactions   Hydrocodone Itching   Tegaderm Ag Mesh [Silver] Itching    ROS Review of Systems  Constitutional:  Negative for appetite change.  HENT:  Negative for ear discharge, nosebleeds, postnasal drip, rhinorrhea, sinus pain, trouble swallowing and voice change.   Eyes:  Negative for pain.  Respiratory:  Negative for chest tightness, shortness of breath and wheezing.   Cardiovascular:  Negative for leg swelling.  Endocrine: Negative for heat intolerance.  Genitourinary:  Negative for flank pain.  Neurological:  Negative for light-headedness.  Psychiatric/Behavioral:  Negative for agitation.       Objective:    Physical Exam HENT:     Head: Normocephalic.     Nose: Nose normal.     Mouth/Throat:     Mouth: Mucous membranes are moist.     Pharynx: No oropharyngeal exudate.  Cardiovascular:     Pulses: Normal pulses.  Pulmonary:     Effort: Pulmonary effort is normal.     Breath sounds: Normal breath sounds.  Musculoskeletal:        General: Normal range of motion.     Cervical  back: Normal range of motion.  Skin:    General: Skin is warm.  Neurological:     General: No focal deficit present.     Mental Status: She is alert.  Psychiatric:        Mood and Affect: Mood normal.     BP 112/84   Pulse 61   Ht 5\' 2"  (1.575 m)   Wt 125 lb 14.4 oz (57.1 kg)   LMP 04/10/2022 (Approximate)   BMI 23.03 kg/m  Wt Readings from Last  3 Encounters:  04/29/22 125 lb 14.4 oz (57.1 kg)  04/18/22 121 lb 4.1 oz (55 kg)  03/26/22 121 lb 4.1 oz (55 kg)     There are no preventive care reminders to display for this patient.  There are no preventive care reminders to display for this patient.  No results found for: "TSH" Lab Results  Component Value Date   WBC 15.0 (H) 03/26/2022   HGB 11.7 (L) 03/26/2022   HCT 34.5 (L) 03/26/2022   MCV 97.7 03/26/2022   PLT 320 03/26/2022   Lab Results  Component Value Date   NA 141 03/26/2022   K 3.6 03/26/2022   CO2 23 03/26/2022   GLUCOSE 108 (H) 03/26/2022   BUN 12 03/26/2022   CREATININE 0.92 03/26/2022   BILITOT 0.1 (L) 12/07/2012   ALKPHOS 66 12/07/2012   AST 16 12/07/2012   ALT 19 12/07/2012   PROT 8.0 12/07/2012   ALBUMIN 4.0 12/07/2012   CALCIUM 9.1 03/26/2022   ANIONGAP 9 03/26/2022   No results found for: "CHOL" No results found for: "HDL" No results found for: "LDLCALC" No results found for: "TRIG" No results found for: "CHOLHDL" No results found for: "HGBA1C"    Assessment & Plan:   Problem List Items Addressed This Visit       Respiratory   Mild intermittent asthma    Patient has a hyperactive airway disease, she will be referred to pulmonary        Digestive   Gastroesophageal reflux disease without esophagitis - Primary    Reflux is stable        Other   Menometrorrhagia    Patient is anemic she was advised to take iron pills every day she has been seen by gynecologist      Bipolar 2 disorder (HCC)    Under control      Tobacco use disorder    - I instructed the patient to  stop smoking and provided them with smoking cessation materials.  - I informed the patient that smoking puts them at increased risk for cancer, COPD, hypertension, and more.  - Informed the patient to seek help if they begin to have trouble breathing, develop chest pain, start to cough up blood, feel faint, or pass out.     Patient was advised to take flu shot and COVID shot, and stop smoking.,  She was advised to take iron pill 1 a day.  No orders of the defined types were placed in this encounter.   Follow-up: No follow-ups on file.    Corky Downs, MD

## 2022-04-29 NOTE — Assessment & Plan Note (Signed)
-   I instructed the patient to stop smoking and provided them with smoking cessation materials.  - I informed the patient that smoking puts them at increased risk for cancer, COPD, hypertension, and more.  - Informed the patient to seek help if they begin to have trouble breathing, develop chest pain, start to cough up blood, feel faint, or pass out.  

## 2022-04-29 NOTE — Assessment & Plan Note (Signed)
Under control 

## 2022-04-29 NOTE — Assessment & Plan Note (Signed)
Patient has a hyperactive airway disease, she will be referred to pulmonary

## 2022-06-10 ENCOUNTER — Ambulatory Visit: Payer: BC Managed Care – PPO | Admitting: Student in an Organized Health Care Education/Training Program

## 2022-06-10 ENCOUNTER — Encounter: Payer: Self-pay | Admitting: Student in an Organized Health Care Education/Training Program

## 2022-06-10 VITALS — BP 130/72 | HR 62 | Temp 97.9°F | Ht 62.0 in | Wt 131.0 lb

## 2022-06-10 DIAGNOSIS — F17210 Nicotine dependence, cigarettes, uncomplicated: Secondary | ICD-10-CM

## 2022-06-10 DIAGNOSIS — R0602 Shortness of breath: Secondary | ICD-10-CM | POA: Diagnosis not present

## 2022-06-10 DIAGNOSIS — F17209 Nicotine dependence, unspecified, with unspecified nicotine-induced disorders: Secondary | ICD-10-CM | POA: Diagnosis not present

## 2022-06-10 DIAGNOSIS — R058 Other specified cough: Secondary | ICD-10-CM

## 2022-06-10 LAB — NITRIC OXIDE: Nitric Oxide: 11

## 2022-06-10 MED ORDER — NICOTINE POLACRILEX 2 MG MT LOZG: 2.0000 mg | LOZENGE | OROMUCOSAL | 3 refills | Status: AC | PRN

## 2022-06-10 MED ORDER — NICOTINE 14 MG/24HR TD PT24: 14.0000 mg | MEDICATED_PATCH | TRANSDERMAL | 0 refills | Status: AC

## 2022-06-10 MED ORDER — NICOTINE 21 MG/24HR TD PT24: 21.0000 mg | MEDICATED_PATCH | TRANSDERMAL | 0 refills | Status: AC

## 2022-06-10 MED ORDER — NICOTINE 7 MG/24HR TD PT24: 7.0000 mg | MEDICATED_PATCH | TRANSDERMAL | 0 refills | Status: AC

## 2022-06-10 NOTE — Patient Instructions (Signed)
[facility] [place] [facility]: Call 1-800-QUIT-NOW (1-800-784-8669). The [place] Quitline is a free service for  residents. Trained counselors are available from 8 am until 3 am, 365 days per year. Services are available in both English and Spanish.   Web Resources Free online support programs can help you track your progress and share experiences with others who are quitting. These are examples: www.becomeanex.org www.trytostop.org  www.smokefree.gov  www.everydayhealth.com/smoking-cessation/index.aspx  [facility]: offers comprehensive in-person tobacco treatment counseling at [facility] building ([address] [place] [address]).  Open to everyone. Virtual appointments available. Free parking. Call [phone] to schedule an appointment or [phone] for general information.    Tobacco Cessation Medications  Nicotine Replacement Therapy (NRT)  Nicotine is the addictive part of tobacco smoke, but not the most dangerous part. There are 7000 other toxins in cigarettes, including carbon monoxide, that cause disease. People do not generally become addicted to medication. Common problems: People don't use enough medication or stop too early. Medications are safe and effective. Overdose is very uncommon. Use medications as long as needed (3 months minimum). Some combinations work better than single medications. Long acting medications like the NRT patch and bupropion provide continuous treatment for withdrawal symptoms.  PLUS  Short acting medications like the NRT gum, lozenge, inhaler, and nasal spray help people to cope with breakthrough cravings.  ? Nicotine Patch  Place patch on hairless skin on upper body, including arms and back. Each day: discard old patch, shower, apply new patch to a different site. Apply hydrocortisone cream to mildly red/irritated areas. Call provider if rash develops. If patch causes sleep disturbance, remove patch  at bedtime and replace each morning after shower. Side effects may include: skin irritation, headache, insomnia, abnormal/vivid dreams.   ? Nicotine Lozenge  Allow to dissolve slowly in mouth (20-30 minutes). Do not chew or swallow. Nicotine release may cause a warm tingling sensation. Occasionally rotate to different areas of the mouth. Use enough to control cravings, up to 20 lozenges per day (if used alone). Avoid eating or drinking for 15 minutes before using and during use. Side effects may include: nausea, hiccups, cough, heartburn, headache, gas, insomnia. 

## 2022-06-10 NOTE — Progress Notes (Signed)
Synopsis: Referred in for cough by Corky Downs, MD  Assessment & Plan:   #Cough #Recurrent Bronchitis  Presents with recurrent bronchitis that was in the setting of URTI's that self resolved after a course of prednisone and antibiotics. Her lung exam today is clear and she has not had any symptoms since her URTI. She does not have symptoms that are suggestive of occupational asthma. NO was normal today at 11 ppb. I will obtain a pulmonary function test to evaluate spirometry, lung volumes, and DLCO to assess for any signs of reactive airway disease.  - Nitric oxide normal at 11 ppb today - Pulmonary Function Test [facility] Only; Future  #Cigarette nicotine dependence without complication  Reports long history of smoking and is interested in quitting. I will prescribe patches and lozenges to help with cessation. Patient counseled at length on importance and benefit of quitting.  - nicotine polacrilex (NICOTINE MINI) 2 MG lozenge; Take 1 lozenge (2 mg total) by mouth every 2 (two) hours as needed for smoking cessation.  Dispense: 72 lozenge; Refill: 3 - nicotine (NICODERM CQ - DOSED IN MG/24 HOURS) 14 mg/24hr patch; Place 1 patch (14 mg total) onto the skin daily for 14 days.  Dispense: 14 patch; Refill: 0 - nicotine (NICODERM CQ - DOSED IN MG/24 HOURS) 21 mg/24hr patch; Place 1 patch (21 mg total) onto the skin daily.  Dispense: 42 patch; Refill: 0 - nicotine (NICODERM CQ - DOSED IN MG/24 HR) 7 mg/24hr patch; Place 1 patch (7 mg total) onto the skin daily for 14 days.  Dispense: 14 patch; Refill: 0   Return in about 3 months (around 09/09/2022).  I spent 60 minutes caring for this patient today, including preparing to see the patient, obtaining a medical history , reviewing a separately obtained history, performing a medically appropriate examination and/or evaluation, counseling and educating the patient/family/caregiver, ordering medications, tests, or procedures, and documenting clinical  information in the electronic health record  Raechel Chute, MD [facility] 06/10/2022 10:48 AM    End of visit medications:  Meds ordered this encounter  Medications   nicotine polacrilex (NICOTINE MINI) 2 MG lozenge    Sig: Take 1 lozenge (2 mg total) by mouth every 2 (two) hours as needed for smoking cessation.    Dispense:  72 lozenge    Refill:  3   nicotine (NICODERM CQ - DOSED IN MG/24 HOURS) 14 mg/24hr patch    Sig: Place 1 patch (14 mg total) onto the skin daily for 14 days.    Dispense:  14 patch    Refill:  0   nicotine (NICODERM CQ - DOSED IN MG/24 HOURS) 21 mg/24hr patch    Sig: Place 1 patch (21 mg total) onto the skin daily.    Dispense:  42 patch    Refill:  0   nicotine (NICODERM CQ - DOSED IN MG/24 HR) 7 mg/24hr patch    Sig: Place 1 patch (7 mg total) onto the skin daily for 14 days.    Dispense:  14 patch    Refill:  0     Current Outpatient Medications:    albuterol (VENTOLIN HFA) 108 (90 Base) MCG/ACT inhaler, Inhale 2 puffs into the lungs every 4 (four) hours as needed for wheezing or shortness of breath., Disp: 6.7 g, Rfl: 0   levocetirizine (XYZAL) 5 MG tablet, Take 1 tablet (5 mg total) by mouth every evening., Disp: 90 tablet, Rfl: 0   nicotine (NICODERM CQ - DOSED IN  MG/24 HOURS) 14 mg/24hr patch, Place 1 patch (14 mg total) onto the skin daily for 14 days., Disp: 14 patch, Rfl: 0   nicotine (NICODERM CQ - DOSED IN MG/24 HOURS) 21 mg/24hr patch, Place 1 patch (21 mg total) onto the skin daily., Disp: 42 patch, Rfl: 0   nicotine (NICODERM CQ - DOSED IN MG/24 HR) 7 mg/24hr patch, Place 1 patch (7 mg total) onto the skin daily for 14 days., Disp: 14 patch, Rfl: 0   nicotine polacrilex (NICOTINE MINI) 2 MG lozenge, Take 1 lozenge (2 mg total) by mouth every 2 (two) hours as needed for smoking cessation., Disp: 72 lozenge, Rfl: 3   promethazine-dextromethorphan (PROMETHAZINE-DM) 6.25-15 MG/5ML syrup, Take 5 mLs by mouth 4 (four) times  daily as needed., Disp: 118 mL, Rfl: 0   Subjective:   PATIENT ID: Adrienne West GENDER: female DOB: 15-Jun-1991, MRN: [id number]  Chief Complaint  Patient presents with   pulmonary consult    Occ SOB with exertion, dry cough at times prod with green to yellow sputum and wheezing at night.     HPI  Adrienne West is a pleasant 31 year old female presenting to clinic for the evaluation of cough and bronchitis.  The patient reports that she was in her overall usual state of health up until September 2023 when she developed recurrent bouts of bronchitis prompting 2 urgent care visits and 1 ED visit.  She reports that she was ill with an upper respiratory tract infection at that time (she has 3 kids that are frequently sick). Her symptoms included cough, shortness of breath, and wheezing/chest tightness. She was seen in the emergency department where a workup included a D-dimer that was negative and a CBC that showed an elevated white cell count.  She was given a course of prednisone, antibiotics, and albuterol inhaler with improvement in her symptoms. Today, she feels that she is back to normal (90% of baseline) and has no symptoms.  She specifically denies a cough, denies shortness of breath, and denies wheezing.  She works for [employer]) and her job is physically demanding.  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 During her illness in August/October, she felt significant shortness of breath and was unable to work but she usually is not affected by her exposures at work. Specifically, she does not have any wheezing or shortness of breath when at work or right after. She is a smoker and reports smoking cigarettes from the age of [age].  She did manage to quit for long periods of time but is currently smoking half a pack a day and is interested in quitting.  She was  born at term. She has 3 children (2 natural births and 1 cesarean section).  Ancillary information including prior medications, full medical/surgical/family/social histories, and PFTs (when available) are listed below and have been reviewed.   Review of Systems  Constitutional:  Negative for chills, fever and weight loss.  HENT:  Negative for hearing loss.   Respiratory:  Negative for cough, hemoptysis, sputum production, shortness of breath and wheezing.   Cardiovascular:  Negative for chest pain, palpitations, leg swelling and PND.  Skin:  Negative for itching and rash.  Neurological:  Negative for dizziness.     Objective:   Vitals:   06/10/22 1015  BP: 130/72  Pulse: 62  Temp: 97.9 F (36.6  C)  TempSrc: Temporal  SpO2: 100%  Weight: 131 lb (59.4 kg)  Height: 5\' 2"  (1.575 m)   100% on RA  BMI Readings from Last 3 Encounters:  06/10/22 23.96 kg/m  04/29/22 23.03 kg/m  04/18/22 22.18 kg/m   Wt Readings from Last 3 Encounters:  06/10/22 131 lb (59.4 kg)  04/29/22 125 lb 14.4 oz (57.1 kg)  04/18/22 121 lb 4.1 oz (55 kg)    Physical Exam Constitutional:      General: She is not in acute distress.    Appearance: Normal appearance. She is normal weight. She is not ill-appearing.  HENT:     Mouth/Throat:     Mouth: Mucous membranes are moist.  Eyes:     Extraocular Movements: Extraocular movements intact.  Cardiovascular:     Rate and Rhythm: Normal rate and regular rhythm.     Pulses: Normal pulses.     Heart sounds: Normal heart sounds.  Pulmonary:     Effort: Pulmonary effort is normal.     Breath sounds: Normal breath sounds.  Abdominal:     General: Abdomen is flat.     Palpations: Abdomen is soft.  Neurological:     General: No focal deficit present.     Mental Status: She is alert and oriented to person, place, and time. Mental status is at baseline.     Ancillary Information    Past Medical History:  Diagnosis Date   Anemia    Anxiety     Bipolar disorder (HCC)    GERD (gastroesophageal reflux disease)    occ   Headache    migraines     Family History  Problem Relation Age of Onset   Bipolar disorder Mother    Endometriosis Mother    Breast cancer Mother 34   Anxiety disorder Mother    Depression Mother    Stroke Father    Hyperlipidemia Father    Hypertension Father    Bipolar disorder Sister    Anxiety disorder Sister    Depression Sister    COPD Maternal Grandmother    Hypothyroidism Maternal Grandmother    Diabetes Maternal Grandfather    Other Maternal Grandfather        Hepatic Encepholapathy   Rheum arthritis Paternal Grandmother    Liver disease Paternal Grandmother    AAA (abdominal aortic aneurysm) Paternal Grandfather    Stroke Paternal Grandfather    Vascular Disease Paternal Grandfather    Breast cancer Maternal Great-grandmother        63s     Past Surgical History:  Procedure Laterality Date   APPENDECTOMY     aspiration of breast cyst Right 2013   CESAREAN SECTION N/A 10/02/2017   Procedure: CESAREAN SECTION;  Surgeon: 10/04/2017, MD;  Location: [facility] ORS;  Service: Obstetrics;  Laterality: N/A;   LAPAROSCOPIC APPENDECTOMY N/A 2012   LAPAROSCOPIC BILATERAL SALPINGECTOMY N/A 11/18/2017   Procedure: LAPAROSCOPIC PARTIAL SALPINGECTOMY;  Surgeon: 11/20/2017, MD;  Location: [facility] ORS;  Service: Gynecology;  Laterality: N/A;   TUBAL LIGATION     WISDOM TOOTH EXTRACTION      Social History   Socioeconomic History   Marital status: Married    Spouse name: jacob   Number of children: 3   Years of education: Not on file   Highest education level: Some college, no degree  Occupational History   Not on file  Tobacco Use   Smoking status: Every Day    Packs/day: 1.00  Years: 12.00    Total pack years: 12.00    Types: Cigarettes   Smokeless tobacco: Never  Vaping Use   Vaping Use: Never used  Substance and Sexual Activity   Alcohol use: Yes    Comment: very rare   Drug  use: No   Sexual activity: Yes    Birth control/protection: Surgical    Comment: Tuabl ligation  Other Topics Concern   Not on file  Social History Narrative   Not on file   Social Determinants of Health   Financial Resource Strain: High Risk (10/29/2017)   Overall Financial Resource Strain (CARDIA)    Difficulty of Paying Living Expenses: Very hard  Food Insecurity: Food Insecurity Present (10/29/2017)   Hunger Vital Sign    Worried About Running Out of Food in the Last Year: Often true    Ran Out of Food in the Last Year: Often true  Transportation Needs: No Transportation Needs (10/29/2017)   PRAPARE - Administrator, Civil ServiceTransportation    Lack of Transportation (Medical): No    Lack of Transportation (Non-Medical): No  Physical Activity: Inactive (10/29/2017)   Exercise Vital Sign    Days of Exercise per Week: 0 days    Minutes of Exercise per Session: 0 min  Stress: No Stress Concern Present (10/29/2017)   [employer] - Occupational Stress Questionnaire    Feeling of Stress : Not at all  Social Connections: Moderately Integrated (10/29/2017)   Social Connection and Isolation Panel [NHANES]    Frequency of Communication with Friends and Family: More than three times a week    Frequency of Social Gatherings with Friends and Family: Once a week    Attends Religious Services: More than 4 times per year    Active Member of Clubs or Organizations: No    Attends BankerClub or Organization Meetings: Never    Marital Status: Married  Catering managerntimate Partner Violence: Not At Risk (10/29/2017)   Humiliation, Afraid, Rape, and Kick questionnaire    Fear of Current or Ex-Partner: No    Emotionally Abused: No    Physically Abused: No    Sexually Abused: No     Allergies  Allergen Reactions   Hydrocodone Itching   Tegaderm Ag Mesh [Silver] Itching     CBC    Component Value Date/Time   WBC 15.0 (H) 03/26/2022 1341   RBC 3.53 (L) 03/26/2022 1341   HGB 11.7 (L) 03/26/2022 1341   HGB  11.2 08/22/2017 0953   HCT 34.5 (L) 03/26/2022 1341   HCT 33.2 (L) 08/22/2017 0953   PLT 320 03/26/2022 1341   PLT 281 08/22/2017 0953   MCV 97.7 03/26/2022 1341   MCV 97 08/22/2017 0953   MCV 92 12/07/2012 2047   MCH 33.1 03/26/2022 1341   MCHC 33.9 03/26/2022 1341   RDW 13.4 03/26/2022 1341   RDW 13.2 08/22/2017 0953   RDW 12.8 12/07/2012 2047   LYMPHSABS 4.0 03/21/2022 1452   LYMPHSABS 2.6 08/22/2017 0953   MONOABS 0.5 03/21/2022 1452   EOSABS 0.1 03/21/2022 1452   EOSABS 0.1 08/22/2017 0953   BASOSABS 0.1 03/21/2022 1452   BASOSABS 0.0 08/22/2017 [id number]    Pulmonary Functions Testing Results:     No data to display          Outpatient Medications Prior to Visit  Medication Sig Dispense Refill   albuterol (VENTOLIN HFA) 108 (90 Base) MCG/ACT inhaler Inhale 2 puffs into the lungs every 4 (four) hours as needed for wheezing or shortness  of breath. 6.7 g 0   levocetirizine (XYZAL) 5 MG tablet Take 1 tablet (5 mg total) by mouth every evening. 90 tablet 0   promethazine-dextromethorphan (PROMETHAZINE-DM) 6.25-15 MG/5ML syrup Take 5 mLs by mouth 4 (four) times daily as needed. 118 mL 0   medroxyPROGESTERone (DEPO-PROVERA) 150 MG/ML injection Inject 1 mL (150 mg total) into the muscle every 3 (three) months. 1 mL 0   No facility-administered medications prior to visit.

## 2022-07-30 ENCOUNTER — Ambulatory Visit: Payer: BC Managed Care – PPO | Admitting: Internal Medicine

## 2022-09-04 ENCOUNTER — Ambulatory Visit: Admit: 2022-09-04 | Discharge status: Against Medical Advice | Payer: BC Managed Care – PPO

## 2022-09-04 DIAGNOSIS — S39012A Strain of muscle, fascia and tendon of lower back, initial encounter: Secondary | ICD-10-CM | POA: Diagnosis not present

## 2022-09-04 DIAGNOSIS — M62830 Muscle spasm of back: Secondary | ICD-10-CM | POA: Diagnosis not present

## 2022-12-10 ENCOUNTER — Other Ambulatory Visit: Payer: Self-pay

## 2022-12-10 ENCOUNTER — Emergency Department
Admission: EM | Admit: 2022-12-10 | Discharge: 2022-12-10 | Disposition: A | Discharge status: Home / Self Care | Payer: Self-pay | Attending: Emergency Medicine | Admitting: Emergency Medicine

## 2022-12-10 DIAGNOSIS — D72829 Elevated white blood cell count, unspecified: Secondary | ICD-10-CM | POA: Insufficient documentation

## 2022-12-10 DIAGNOSIS — R1032 Left lower quadrant pain: Secondary | ICD-10-CM | POA: Insufficient documentation

## 2022-12-10 DIAGNOSIS — K644 Residual hemorrhoidal skin tags: Secondary | ICD-10-CM | POA: Insufficient documentation

## 2022-12-10 DIAGNOSIS — R1031 Right lower quadrant pain: Secondary | ICD-10-CM | POA: Insufficient documentation

## 2022-12-10 DIAGNOSIS — R197 Diarrhea, unspecified: Secondary | ICD-10-CM | POA: Diagnosis not present

## 2022-12-10 LAB — COMPREHENSIVE METABOLIC PANEL
ALT: 43 U/L (ref 0–44)
AST: 37 U/L (ref 15–41)
Albumin: 4.2 g/dL (ref 3.5–5.0)
Alkaline Phosphatase: 65 U/L (ref 38–126)
Anion gap: 11 (ref 5–15)
BUN: 14 mg/dL (ref 6–20)
CO2: 20 mmol/L — ABNORMAL LOW (ref 22–32)
Calcium: 9 mg/dL (ref 8.9–10.3)
Chloride: 107 mmol/L (ref 98–111)
Creatinine, Ser: 0.71 mg/dL (ref 0.44–1.00)
GFR, Estimated: >60 mL/min (ref >60)
Glucose, Bld: 93 mg/dL (ref 70–99)
Potassium: 4.1 mmol/L (ref 3.5–5.1)
Sodium: 138 mmol/L (ref 135–145)
Total Bilirubin: 0.9 mg/dL (ref 0.3–1.2)
Total Protein: 7.7 g/dL (ref 6.5–8.1)

## 2022-12-10 LAB — URINALYSIS, ROUTINE W REFLEX MICROSCOPIC
Bacteria, UA: NONE SEEN
Bilirubin Urine: NEGATIVE
Glucose, UA: NEGATIVE mg/dL
Ketones, ur: NEGATIVE mg/dL
Leukocytes,Ua: NEGATIVE
Nitrite: NEGATIVE
Protein, ur: NEGATIVE mg/dL
Specific Gravity, Urine: 1.004 — ABNORMAL LOW (ref 1.005–1.030)
pH: 7 (ref 5.0–8.0)

## 2022-12-10 LAB — HCG, QUANTITATIVE, PREGNANCY: hCG, Beta Chain, Quant, S: 1 m[IU]/mL (ref <5)

## 2022-12-10 LAB — LIPASE, BLOOD: Lipase: 42 U/L (ref 11–51)

## 2022-12-10 LAB — CBC
HCT: 37.8 % (ref 36.0–46.0)
Hemoglobin: 12.7 g/dL (ref 12.0–15.0)
MCH: 33.5 pg (ref 26.0–34.0)
MCHC: 33.6 g/dL (ref 30.0–36.0)
MCV: 99.7 fL (ref 80.0–100.0)
Platelets: 252 10*3/uL (ref 150–400)
RBC: 3.79 MIL/uL — ABNORMAL LOW (ref 3.87–5.11)
RDW: 13 % (ref 11.5–15.5)
WBC: 15.2 10*3/uL — ABNORMAL HIGH (ref 4.0–10.5)
nRBC: 0 % (ref 0.0–0.2)

## 2022-12-10 LAB — POC URINE PREG, ED: Preg Test, Ur: NEGATIVE

## 2022-12-10 MED ORDER — LACTATED RINGERS IV BOLUS
1000.0000 mL | Freq: Once | INTRAVENOUS | Status: AC
  Administered 2022-12-10: 1000 mL via INTRAVENOUS

## 2022-12-10 MED ORDER — KETOROLAC TROMETHAMINE 15 MG/ML IJ SOLN
15.0000 mg | Freq: Once | INTRAMUSCULAR | Status: AC
  Administered 2022-12-10: 15 mg via INTRAVENOUS
  Filled 2022-12-10: qty 1

## 2022-12-10 MED ORDER — ONDANSETRON HCL 4 MG/2ML IJ SOLN
4.0000 mg | Freq: Once | INTRAMUSCULAR | Status: AC
  Administered 2022-12-10: 4 mg via INTRAVENOUS
  Filled 2022-12-10: qty 2

## 2022-12-10 NOTE — ED Triage Notes (Signed)
Pt comes with c/o N/V belly pain and blood in stool that started at 2am. Pt states bright red  blood.l pt denies any thinners.

## 2022-12-10 NOTE — ED Provider Notes (Signed)
[facility] Provider Note    Event Date/Time   First MD Initiated Contact with Patient 12/10/22 [phone]     (approximate)   History   Abdominal Pain   HPI  Adrienne West is a 32 y.o. female with past medical history of anxiety, bipolar disorder, GERD, migraine headache who presents because of bloody diarrhea.  Around 2 AM patient started having abdominal cramping and then had profuse diarrhea.  Had more episodes and she can count initially was normal but then the last 4 times she has had some blood.  Says that initially was just a teaspoon of blood about an then increased and was in the toilet.  Last episode seem to be slowing down.  Had 4 total episodes of blood.  Blood was not mixed in with the stool.  Denies black stool.  She does endorse abdominal cramping.  Comes and goes and is in the bilateral lower quadrants.  Feels worse when she is about to have an episode of diarrhea.  No fevers but has had some chills.  Had 2 episodes of emesis.  No sick contacts.  3 of hemorrhoids with blood from hemorrhoids in the past.     Past Medical History:  Diagnosis Date   Anemia    Anxiety    Bipolar disorder (HCC)    GERD (gastroesophageal reflux disease)    occ   Headache    migraines    Patient Active Problem List   Diagnosis Date Noted   Mild intermittent asthma 04/29/2022   Bipolar 2 disorder (HCC) 09/26/2020   Tobacco use disorder 09/26/2020   Gastroesophageal reflux disease without esophagitis 09/26/2020   Family history of breast cancer in mother 09/26/2020   Menometrorrhagia 04/29/2018     Physical Exam  Triage Vital Signs: ED Triage Vitals  Enc Vitals Group     BP 12/10/22 0909 106/73     Pulse Rate 12/10/22 0909 67     Resp 12/10/22 0909 18     Temp 12/10/22 0909 98.1 F (36.7 C)     Temp Source 12/10/22 0909 Oral     SpO2 12/10/22 0909 100 %     Weight --      Height --      Head Circumference --      Peak Flow --      Pain Score  12/10/22 0908 7     Pain Loc --      Pain Edu? --      Excl. in GC? --     Most recent vital signs: Vitals:   12/10/22 1000 12/10/22 1030  BP: 96/71 102/71  Pulse: 63 (!) 55  Resp:    Temp:    SpO2: 97% 100%     General: Awake, no distress.  CV:  Good peripheral perfusion.  Resp:  Normal effort.  Abd:  No distention.  Mild tenderness in the bilateral lower quadrants but no guarding Neuro:             Awake, Alert, Oriented x 3  Other:  Patient does have external hemorrhoid that is not actively bleeding on rectal exam, no blood on DRE, empty rectal vault   ED Results / Procedures / Treatments  Labs (all labs ordered are listed, but only abnormal results are displayed) Labs Reviewed  COMPREHENSIVE METABOLIC PANEL - Abnormal; Notable for the following components:      Result Value   CO2 20 (*)    All other components within normal  limits  CBC - Abnormal; Notable for the following components:   WBC 15.2 (*)    RBC 3.79 (*)    All other components within normal limits  URINALYSIS, ROUTINE W REFLEX MICROSCOPIC - Abnormal; Notable for the following components:   Color, Urine STRAW (*)    APPearance CLEAR (*)    Specific Gravity, Urine 1.004 (*)    Hgb urine dipstick MODERATE (*)    All other components within normal limits  GASTROINTESTINAL PANEL BY PCR, STOOL (REPLACES STOOL CULTURE)  LIPASE, BLOOD  HCG, QUANTITATIVE, PREGNANCY  POC URINE PREG, ED     EKG    RADIOLOGY    PROCEDURES:  Critical Care performed: No  Procedures  The patient is on the cardiac monitor to evaluate for evidence of arrhythmia and/or significant heart rate changes.   MEDICATIONS ORDERED IN ED: Medications  lactated ringers bolus 1,000 mL (0 mLs Intravenous Stopped 12/10/22 1211)  ondansetron (ZOFRAN) injection 4 mg (4 mg Intravenous Given 12/10/22 1035)  ketorolac (TORADOL) 15 MG/ML injection 15 mg (15 mg Intravenous Given 12/10/22 1038)     IMPRESSION / MDM / ASSESSMENT AND PLAN /  ED COURSE  I reviewed the triage vital signs and the nursing notes.                              Patient's presentation is most consistent with acute complicated illness / injury requiring diagnostic workup.  Differential diagnosis includes, but is not limited to, infectious diarrhea gluten bacterial or viral infection, colitis, IBD, hemorrhoidal bleeding  The patient is a 32 year old female who presents with diarrhea and bloody stool.  Started last night overnight.  Has had multiple episodes of stool that started out as nonbloody.  Last 4 times she has had frank blood and says that overall it is slowing down.  Denies black stool.  She does have abdominal cramping bilateral lower quadrants worse when she needs to have an episode of diarrhea.  Has been tolerating p.o. but just 2 episodes of emesis.  No recent travel out of the country or recent antibiotics.  No sick contacts.   Sick contacts.  Vital signs are stable.  On exam overall she looks well.  Abdominal exam is overall benign.  Does have some mild tenderness to the lower quadrants but no guarding and abdomen is soft.  No blood on digital rectal exam, does have external hemorrhoid.  I suspect infectious diarrhea versus hemorrhoidal bleeding secondary to diarrhea.  Will check CBC CMP and lipase.  Will give a bolus of fluid Toradol Zofran.  I have ordered GI PCR if patient does have an episode of diarrhea in the ED.  Patient's labs are notable for leukocytosis, no anemia.  Patient is not able to provide a stool sample after 3 hours in the ER.  She is not immunocompromised not febrile with benign abdomen do not feel that she needs any empiric antibiotics.  She felt improved after bolus of fluid and Toradol is tolerating p.o.  Recommended Tylenol Motrin for pain.  Discussed avoiding Imodium given bloody stool.  We discussed return precautions for worsening abdominal pain.  Otherwise she is appropriate for discharge at this time.     FINAL  CLINICAL IMPRESSION(S) / ED DIAGNOSES   Final diagnoses:  Diarrhea, unspecified type     Rx / DC Orders   ED Discharge Orders     None        Note:  This document was prepared using Dragon voice recognition software and may include unintentional dictation errors.   Georga Hacking, MD 12/10/22 [phone]

## 2022-12-10 NOTE — Discharge Instructions (Signed)
I would stick to clear liquids while you are feeling sick and you can then advance her diet as tolerated.  Given you are having blood in your stool do not take any Imodium.  You can take Tylenol Motrin for pain.  If your abdominal pain is worsening please return to the emergency department.

## 2022-12-17 ENCOUNTER — Ambulatory Visit: Payer: BC Managed Care – PPO

## 2022-12-18 ENCOUNTER — Ambulatory Visit: Payer: BC Managed Care – PPO | Admitting: Student in an Organized Health Care Education/Training Program

## 2023-02-22 ENCOUNTER — Emergency Department: Payer: Self-pay

## 2023-02-22 ENCOUNTER — Other Ambulatory Visit: Payer: Self-pay

## 2023-02-22 ENCOUNTER — Emergency Department
Admission: EM | Admit: 2023-02-22 | Discharge: 2023-02-22 | Disposition: A | Discharge status: Home / Self Care | Payer: Self-pay | Attending: Emergency Medicine | Admitting: Emergency Medicine

## 2023-02-22 DIAGNOSIS — K5900 Constipation, unspecified: Secondary | ICD-10-CM | POA: Insufficient documentation

## 2023-02-22 MED ORDER — LACTULOSE 20 G PO PACK: 20.0000 g | PACK | Freq: Every day | ORAL | 0 refills | Status: AC | PRN

## 2023-02-22 MED ORDER — LACTULOSE 10 GM/15ML PO SOLN
20.0000 g | Freq: Once | ORAL | Status: AC
  Administered 2023-02-22: 20 g via ORAL
  Filled 2023-02-22: qty 30

## 2023-02-22 MED ORDER — GLYCERIN (LAXATIVE) 1 G RE SUPP
1.0000 | Freq: Once | RECTAL | Status: AC
  Administered 2023-02-22: 1 g via RECTAL
  Filled 2023-02-22 (×2): qty 1

## 2023-02-22 NOTE — ED Triage Notes (Signed)
Pt presents to ED stating "I haven't pooped in 25 days". Pt states she took Miralax with no relief. Pt denies urinary s/s. Pt does endorse flatus. Pt states HX of Hemorids.

## 2023-02-22 NOTE — Discharge Instructions (Addendum)
Take the prescription meds as directed. Consider OTC glycerin suppositories for stool passage. Increase your fiber intake and consider daily Miralax to promote normal daily stools.

## 2023-02-22 NOTE — ED Provider Notes (Signed)
[facility] Emergency Department Provider Note     Event Date/Time   First MD Initiated Contact with Patient 02/22/23 1547     (approximate)   History   Constipation   HPI  Adrienne West is a 32 y.o. female with a history of bipolar disorder, anxiety, and GERD, presents herself to the ED reporting persistent constipation. She would report that "I haven't pooped in 25 days."  She denies any FCS, NVD, chest pain or SOB. She has taken Miralax with no benefit.    Physical Exam   Triage Vital Signs: ED Triage Vitals  Encounter Vitals Group     BP 02/22/23 1512 (!) 112/94     Systolic BP Percentile --      Diastolic BP Percentile --      Pulse Rate 02/22/23 1512 63     Resp 02/22/23 1512 17     Temp 02/22/23 1512 98.4 F (36.9 C)     Temp Source 02/22/23 1512 Oral     SpO2 02/22/23 1512 99 %     Weight --      Height --      Head Circumference --      Peak Flow --      Pain Score 02/22/23 1513 0     Pain Loc --      Pain Education --      Exclude from Growth Chart --     Most recent vital signs: Vitals:   02/22/23 1512  BP: (!) 112/94  Pulse: 63  Resp: 17  Temp: 98.4 F (36.9 C)  SpO2: 99%    General Awake, no distress. NAD HEENT NCAT. PERRL. EOMI. No rhinorrhea. Mucous membranes are moist.  CV:  Good peripheral perfusion. RRR RESP:  Normal effort.  ABD:  No distention. Soft, nontender.  No rebound, guarding, or rigidity noted.   ED Results / Procedures / Treatments   Labs (all labs ordered are listed, but only abnormal results are displayed) Labs Reviewed - No data to display   EKG   RADIOLOGY  I personally viewed and evaluated these images as part of my medical decision making, as well as reviewing the written report by the radiologist.  ED Provider Interpretation: Moderate stool burden without evidence of SBO or fecal impaction  DG Abdomen 1 View  Result Date: 02/22/2023 CLINICAL DATA:  Constipation EXAM:  ABDOMEN - 1 VIEW COMPARISON:  06/11/2011 abdominal radiographs FINDINGS: No dilated small bowel loops. Diffuse large colorectal stool volume. No evidence of pneumatosis or pneumoperitoneum. Clear lung bases. No radiopaque nephrolithiasis. IMPRESSION: Diffuse large colorectal stool volume, compatible with reported constipation. Nonobstructive bowel gas pattern. Electronically Signed   By: Delbert Phenix M.D.   On: 02/22/2023 15:58     PROCEDURES:  Critical Care performed: No  Procedures   MEDICATIONS ORDERED IN ED: Medications  glycerin (Pediatric) 1 g suppository 1 g (1 g Rectal Given 02/22/23 1735)  lactulose (CHRONULAC) 10 GM/15ML solution 20 g (20 g Oral Given 02/22/23 1735)     IMPRESSION / MDM / ASSESSMENT AND PLAN / ED COURSE  I reviewed the triage vital signs and the nursing notes.                              Differential diagnosis includes, but is not limited to, ovarian cyst, ovarian torsion, acute appendicitis, diverticulitis, urinary tract infection/pyelonephritis, endometriosis, bowel obstruction, colitis, renal colic, gastroenteritis, hernia, fibroids, endometriosis,  pregnancy related pain including ectopic pregnancy, etc.   Patient's presentation is most consistent with acute complicated illness / injury requiring diagnostic workup.  Patient's diagnosis is consistent with constipation without evidence of SBO or fecal impaction.  Patient was given a dose of lactulose in the ED.  She was offered but declined both glycerin suppository or enema at bedside.  Patient would prefer to manage her symptoms at home and comfort.  Instructions to pick up glycerin suppositories as well as daily MiraLAX were provided.  Patient is to follow up with her primary provider or local community clinic, as needed or otherwise directed. Patient is given ED precautions to return to the ED for any worsening or new symptoms.  FINAL CLINICAL IMPRESSION(S) / ED DIAGNOSES   Final diagnoses:   Constipation, unspecified constipation type     Rx / DC Orders   ED Discharge Orders          Ordered    lactulose (CEPHULAC) 20 g packet  Daily PRN        02/22/23 1719             Note:  This document was prepared using Dragon voice recognition software and may include unintentional dictation errors.    Lissa Hoard, PA-C 02/22/23 2347    Janith Lima, MD 02/23/23 1106

## 2023-03-03 ENCOUNTER — Emergency Department: Payer: Self-pay

## 2023-03-03 ENCOUNTER — Encounter: Payer: Self-pay | Admitting: Emergency Medicine

## 2023-03-03 ENCOUNTER — Emergency Department
Admission: EM | Admit: 2023-03-03 | Discharge: 2023-03-04 | Disposition: A | Discharge status: Home / Self Care | Payer: Self-pay | Attending: Emergency Medicine | Admitting: Emergency Medicine

## 2023-03-03 ENCOUNTER — Other Ambulatory Visit: Payer: Self-pay

## 2023-03-03 DIAGNOSIS — K5900 Constipation, unspecified: Secondary | ICD-10-CM | POA: Insufficient documentation

## 2023-03-03 LAB — CBC WITH DIFFERENTIAL/PLATELET
Abs Immature Granulocytes: 0.02 10*3/uL (ref 0.00–0.07)
Basophils Absolute: 0.1 10*3/uL (ref 0.0–0.1)
Basophils Relative: 1 %
Eosinophils Absolute: 0.1 10*3/uL (ref 0.0–0.5)
Eosinophils Relative: 1 %
HCT: 35.6 % — ABNORMAL LOW (ref 36.0–46.0)
Hemoglobin: 12 g/dL (ref 12.0–15.0)
Immature Granulocytes: 0 %
Lymphocytes Relative: 49 %
Lymphs Abs: 4.1 10*3/uL — ABNORMAL HIGH (ref 0.7–4.0)
MCH: 33.3 pg (ref 26.0–34.0)
MCHC: 33.7 g/dL (ref 30.0–36.0)
MCV: 98.9 fL (ref 80.0–100.0)
Monocytes Absolute: 0.4 10*3/uL (ref 0.1–1.0)
Monocytes Relative: 4 %
Neutro Abs: 3.8 10*3/uL (ref 1.7–7.7)
Neutrophils Relative %: 45 %
Platelets: 269 10*3/uL (ref 150–400)
RBC: 3.6 MIL/uL — ABNORMAL LOW (ref 3.87–5.11)
RDW: 12.9 % (ref 11.5–15.5)
WBC: 8.5 10*3/uL (ref 4.0–10.5)
nRBC: 0 % (ref 0.0–0.2)

## 2023-03-03 LAB — URINALYSIS, ROUTINE W REFLEX MICROSCOPIC
Bilirubin Urine: NEGATIVE
Glucose, UA: NEGATIVE mg/dL
Ketones, ur: NEGATIVE mg/dL
Nitrite: NEGATIVE
Protein, ur: NEGATIVE mg/dL
Specific Gravity, Urine: 1.006 (ref 1.005–1.030)
pH: 8 (ref 5.0–8.0)

## 2023-03-03 LAB — COMPREHENSIVE METABOLIC PANEL
ALT: 18 U/L (ref 0–44)
AST: 22 U/L (ref 15–41)
Albumin: 4.3 g/dL (ref 3.5–5.0)
Alkaline Phosphatase: 46 U/L (ref 38–126)
Anion gap: 8 (ref 5–15)
BUN: 12 mg/dL (ref 6–20)
CO2: 25 mmol/L (ref 22–32)
Calcium: 9.7 mg/dL (ref 8.9–10.3)
Chloride: 105 mmol/L (ref 98–111)
Creatinine, Ser: 0.87 mg/dL (ref 0.44–1.00)
GFR, Estimated: >60 mL/min (ref >60)
Glucose, Bld: 95 mg/dL (ref 70–99)
Potassium: 3.8 mmol/L (ref 3.5–5.1)
Sodium: 138 mmol/L (ref 135–145)
Total Bilirubin: 0.5 mg/dL (ref 0.3–1.2)
Total Protein: 7.9 g/dL (ref 6.5–8.1)

## 2023-03-03 LAB — POC URINE PREG, ED: Preg Test, Ur: NEGATIVE

## 2023-03-03 MED ORDER — IOHEXOL 300 MG/ML  SOLN
100.0000 mL | Freq: Once | INTRAMUSCULAR | Status: AC | PRN
  Administered 2023-03-04: 100 mL via INTRAVENOUS

## 2023-03-03 MED ORDER — MORPHINE SULFATE (PF) 4 MG/ML IV SOLN
4.0000 mg | Freq: Once | INTRAVENOUS | Status: AC
  Administered 2023-03-03: 4 mg via INTRAVENOUS
  Filled 2023-03-03: qty 1

## 2023-03-03 MED ORDER — KETOROLAC TROMETHAMINE 30 MG/ML IJ SOLN
30.0000 mg | Freq: Once | INTRAMUSCULAR | Status: AC
  Administered 2023-03-03: 30 mg via INTRAVENOUS
  Filled 2023-03-03: qty 1

## 2023-03-03 NOTE — Discharge Instructions (Addendum)
Continue taking MiraLAX daily.  In addition to this, you can take a bottle of magnesium citrate.  Take half the bottle, wait several hours, and then if there is no bowel movement take the rest of the bottle.  You may also use an enema, either sodium phosphate or mineral oil.  You should let the enema stay in for 10 or 15 minutes.  You may repeat the following day, up to 2 to 3 days in a row.  With a gastroenterologist.  Return to the ER for new, worsening, or persistent severe constipation, abdominal pain, swelling, vomiting, blood in the stool, or any other new or worsening symptoms that concern you.

## 2023-03-03 NOTE — ED Triage Notes (Signed)
Pt presents ambulatory to triage via POV with complaints of constipation x "30 days". Pt states she has tried Miralax and citrate magnesium without any relief. Pt notes having a "few pebbles" 2 days ago but has increased abdominal cramping and GERD. A&Ox4 at this time. Denies CP or SOB.

## 2023-03-03 NOTE — ED Notes (Signed)
 Pt denied being able to produce a urine sample at this time. Pt provided with a labeled specimen cup and instructions to return cup to triage nurse desk once it has a clean catch urine sample.

## 2023-03-03 NOTE — ED Provider Notes (Signed)
[facility] Provider Note    Event Date/Time   First MD Initiated Contact with Patient 03/03/23 2138     (approximate)   History   Constipation   HPI  Adrienne West is a 32 y.o. female with history of GERD, bipolar disorder, and anxiety who presents with constipation for approximate last 30 days.  The patient states that last few days she has only had very small bowel movements with hard pellets of stool.  She reports increased abdominal distention and intermittent crampy pain.  She has had some nausea as well but no vomiting.  She denies any fever or chills.  She denies any blood in the stool.  She states she was seen in the ED earlier this month.  She has used MiraLAX, has taken a half a bottle of magnesium citrate once, lactulose once, and used an enema once, all with no relief.  I have the past medical records and confirmed that the patient was evaluated on 8/17.  Her most recent outpatient encounter was with urgent care in February for a lumbar strain.   Physical Exam   Triage Vital Signs: ED Triage Vitals  Encounter Vitals Group     BP 03/03/23 1948 103/77     Systolic BP Percentile --      Diastolic BP Percentile --      Pulse Rate 03/03/23 1948 77     Resp 03/03/23 1948 16     Temp 03/03/23 1948 97.9 F (36.6 C)     Temp Source 03/03/23 1948 Oral     SpO2 03/03/23 1948 99 %     Weight 03/03/23 1947 115 lb (52.2 kg)     Height 03/03/23 1947 5\' 2"  (1.575 m)     Head Circumference --      Peak Flow --      Pain Score 03/03/23 1950 10     Pain Loc --      Pain Education --      Exclude from Growth Chart --     Most recent vital signs: Vitals:   03/03/23 1948  BP: 103/77  Pulse: 77  Resp: 16  Temp: 97.9 F (36.6 C)  SpO2: 99%     General: Awake, no distress.  CV:  Good peripheral perfusion.  Resp:  Normal effort.  Abd:  Mild distention.  Soft with no focal tenderness. Other:  No palpable impacted stool on DRE.  No  significant hemorrhoids or bleeding.   ED Results / Procedures / Treatments   Labs (all labs ordered are listed, but only abnormal results are displayed) Labs Reviewed  CBC WITH DIFFERENTIAL/PLATELET - Abnormal; Notable for the following components:      Result Value   RBC 3.60 (*)    HCT 35.6 (*)    Lymphs Abs 4.1 (*)    All other components within normal limits  URINALYSIS, ROUTINE W REFLEX MICROSCOPIC - Abnormal; Notable for the following components:   Color, Urine STRAW (*)    APPearance CLEAR (*)    Hgb urine dipstick MODERATE (*)    Leukocytes,Ua MODERATE (*)    Bacteria, UA RARE (*)    All other components within normal limits  COMPREHENSIVE METABOLIC PANEL  POC URINE PREG, ED     EKG     RADIOLOGY  XR abdomen: I independently viewed and interpreted the images; there are no dilated bowel loops or evidence of obstruction.  CT abdomen/pelvis: Pending  PROCEDURES:  Critical Care performed: No  Procedures   MEDICATIONS ORDERED IN ED: Medications  ketorolac (TORADOL) 30 MG/ML injection 30 mg (30 mg Intravenous Given 03/03/23 2157)  morphine (PF) 4 MG/ML injection 4 mg (4 mg Intravenous Given 03/03/23 2320)     IMPRESSION / MDM / ASSESSMENT AND PLAN / ED COURSE  I reviewed the triage vital signs and the nursing notes.  32 year old female with PMH as noted above presents with persistent constipation despite multiple treatments at home.  She also reports abdominal distention and pain as well as some nausea.  On exam she is overall well-appearing with normal vital signs.  The abdomen is soft but mildly distended.  DRE reveals no impacted stool.  She has not had any recent medication changes or other specific factors to precipitate acute constipation.  Differential diagnosis includes, but is not limited to, simple constipation, bowel obstruction, colitis.  Lab workup shows no leukocytosis or anemia.  Electrolytes are normal.  Given the pain, distention, nausea we  will obtain a CT to rule out obstruction.  Patient's presentation is most consistent with acute complicated illness / injury requiring diagnostic workup.  ----------------------------------------- 11:55 PM on 03/03/2023 -----------------------------------------  CT is pending.  If this is negative anticipate discharge home.  I have discussed with the patient an appropriate bowel regimen and for home.  I instructed her to continue MiraLAX and advised that she can take additional magnesium citrate.  She can also use fleets enemas up to 2 or 3 times and I instructed her on their appropriate use.   FINAL CLINICAL IMPRESSION(S) / ED DIAGNOSES   Final diagnoses:  Constipation, unspecified constipation type     Rx / DC Orders   ED Discharge Orders     None        Note:  This document was prepared using Dragon voice recognition software and may include unintentional dictation errors.    Dionne Bucy, MD 03/03/23 2356

## 2023-03-03 NOTE — ED Notes (Signed)
ED Provider at bedside. 

## 2023-05-04 ENCOUNTER — Emergency Department (HOSPITAL_COMMUNITY)
Admission: EM | Admit: 2023-05-04 | Discharge: 2023-05-04 | Disposition: A | Discharge status: Home / Self Care | Payer: Self-pay | Attending: Emergency Medicine | Admitting: Emergency Medicine

## 2023-05-04 ENCOUNTER — Emergency Department (HOSPITAL_COMMUNITY): Payer: Self-pay

## 2023-05-04 ENCOUNTER — Encounter (HOSPITAL_COMMUNITY): Payer: Self-pay

## 2023-05-04 ENCOUNTER — Other Ambulatory Visit: Payer: Self-pay

## 2023-05-04 DIAGNOSIS — W540XXA Bitten by dog, initial encounter: Secondary | ICD-10-CM | POA: Insufficient documentation

## 2023-05-04 DIAGNOSIS — Z23 Encounter for immunization: Secondary | ICD-10-CM | POA: Insufficient documentation

## 2023-05-04 DIAGNOSIS — S61412A Laceration without foreign body of left hand, initial encounter: Secondary | ICD-10-CM | POA: Insufficient documentation

## 2023-05-04 MED ORDER — OXYCODONE-ACETAMINOPHEN 5-325 MG PO TABS
1.0000 | ORAL_TABLET | Freq: Once | ORAL | Status: AC
  Administered 2023-05-04: 1 via ORAL
  Filled 2023-05-04: qty 1

## 2023-05-04 MED ORDER — LIDOCAINE-EPINEPHRINE-TETRACAINE (LET) TOPICAL GEL
3.0000 mL | Freq: Once | TOPICAL | Status: AC
  Administered 2023-05-04: 3 mL via TOPICAL
  Filled 2023-05-04: qty 3

## 2023-05-04 MED ORDER — TETANUS-DIPHTH-ACELL PERTUSSIS 5-2.5-18.5 LF-MCG/0.5 IM SUSY
0.5000 mL | PREFILLED_SYRINGE | Freq: Once | INTRAMUSCULAR | Status: AC
  Administered 2023-05-04: 0.5 mL via INTRAMUSCULAR
  Filled 2023-05-04: qty 0.5

## 2023-05-04 MED ORDER — KETOROLAC TROMETHAMINE 30 MG/ML IJ SOLN
INTRAMUSCULAR | Status: AC
  Filled 2023-05-04: qty 1

## 2023-05-04 MED ORDER — AMOXICILLIN-POT CLAVULANATE 875-125 MG PO TABS: 1.0000 | ORAL_TABLET | Freq: Two times a day (BID) | ORAL | 0 refills | Status: AC

## 2023-05-04 MED ORDER — AMOXICILLIN-POT CLAVULANATE 875-125 MG PO TABS
1.0000 | ORAL_TABLET | Freq: Once | ORAL | Status: AC
  Administered 2023-05-04: 1 via ORAL
  Filled 2023-05-04: qty 1

## 2023-05-04 MED ORDER — ONDANSETRON 4 MG PO TBDP
4.0000 mg | ORAL_TABLET | Freq: Once | ORAL | Status: AC
  Administered 2023-05-04: 4 mg via ORAL
  Filled 2023-05-04: qty 1

## 2023-05-04 MED ORDER — LIDOCAINE HCL (PF) 1 % IJ SOLN
2.0000 mL | Freq: Once | INTRAMUSCULAR | Status: DC
  Filled 2023-05-04: qty 5

## 2023-05-04 MED ORDER — KETOROLAC TROMETHAMINE 30 MG/ML IJ SOLN
30.0000 mg | Freq: Once | INTRAMUSCULAR | Status: AC
  Administered 2023-05-04: 30 mg via INTRAMUSCULAR
  Filled 2023-05-04: qty 1

## 2023-05-04 NOTE — ED Notes (Signed)
[facility] Animal control contacted to verify notification of bite. Animal control has been notified.

## 2023-05-04 NOTE — ED Triage Notes (Signed)
Pt reports right hand was bit by neighbors dog. It is currently bandaged, blood is noted on the fingers. Pain rated an 8/10. No nausea, vomiting, or passing out.

## 2023-05-04 NOTE — Discharge Instructions (Addendum)
You for letting us evaluate you today.  Your x-ray was negative for acute fracture.  Please make sure to not submerge hand in soapy or dirty water.  You may allow soap to rinse off your body and shower.  I have prescribed Augmentin to be sent to your [facility] pharmacy on [address].  Please take Tylenol or ibuprofen as needed for pain.  You may follow-up with your primary care provider or urgent care provider for removal of stitch in 8 to 10 days  Please return to emergency department if you experience significant swelling of digit, inability to flex or extend, pain along palm, pain with passive extension of finger

## 2023-05-04 NOTE — ED Provider Notes (Signed)
[place] EMERGENCY DEPARTMENT AT [facility] Provider Note   CSN: [id number] Arrival date & time: 05/04/23  1407     History  Chief Complaint  Patient presents with   Animal Bite    Adrienne West is a 32 y.o. female past medical history of GERD, bipolar, anxiety presents to the emergency department for evaluation following a cane corso dog bite to right hand. Patient was visiting her cousin when the dog suddenly bit her hand. She denies head injury, LOC, additional injuries   Animal Bite Associated symptoms: no fever and no numbness        Home Medications Prior to Admission medications   Medication Sig Start Date End Date Taking? Authorizing Provider  acetaminophen (TYLENOL) 500 MG tablet Take 1,000 mg by mouth daily as needed for moderate pain (pain score 4-6), headache or fever.   Yes [provider]  amoxicillin-clavulanate (AUGMENTIN) 875-125 MG tablet Take 1 tablet by mouth every 12 (twelve) hours for 8 days. 05/04/23 05/12/23 Yes Judithann Sheen, PA      Allergies    Hydrocodone and Tegaderm ag mesh [silver]    Review of Systems   Review of Systems  Constitutional:  Negative for chills, fatigue and fever.  Respiratory:  Negative for cough, chest tightness, shortness of breath and wheezing.   Cardiovascular:  Negative for chest pain and palpitations.  Gastrointestinal:  Negative for abdominal pain, constipation, diarrhea, nausea and vomiting.  Skin:  Positive for wound.  Neurological:  Negative for dizziness, seizures, weakness, light-headedness, numbness and headaches.    Physical Exam Updated Vital Signs BP 98/83 (BP Location: Left Arm)   Pulse 64   Temp 98.3 F (36.8 C) (Oral)   Resp 16   Ht 5\' 2"  (1.575 m)   Wt 55.8 kg   LMP  (Within Weeks)   SpO2 100%   BMI 22.50 kg/m  Physical Exam Vitals and nursing note reviewed.  Constitutional:      General: She is not in acute distress.    Appearance: Normal appearance. She is not  diaphoretic.  HENT:     Head: Normocephalic and atraumatic.     Comments: No battle sign No hematoma    Right Ear: External ear normal.     Left Ear: External ear normal.     Ears:     Comments: No hemotympanum    Nose: Nose normal.     Comments: No epistaxis nor dried blood in nostrils bilaterally    Mouth/Throat:     Comments: No damage to tongue Eyes:     General:        Right eye: No discharge.        Left eye: No discharge.     Extraocular Movements: Extraocular movements intact.     Conjunctiva/sclera: Conjunctivae normal.     Pupils: Pupils are equal, round, and reactive to light.     Comments: No subconjunctival hemorrhage, hyphema, tear drop pupil, or fluid leakage bilaterally  Neck:     Vascular: No carotid bruit.     Comments: No masses palpated Cardiovascular:     Rate and Rhythm: Normal rate.     Pulses: Normal pulses.     Comments: Radial pulses 2+ equal bilaterally Dorsalis pedis 2+ equal bilaterally Pulmonary:     Effort: Pulmonary effort is normal. No respiratory distress.     Breath sounds: Normal breath sounds. No wheezing.  Chest:     Chest wall: No tenderness.  Abdominal:  General: Bowel sounds are normal. There is no distension.     Palpations: Abdomen is soft.     Tenderness: There is no abdominal tenderness.  Musculoskeletal:     Cervical back: Normal range of motion and neck supple. No rigidity or tenderness.     Comments: No obvious deformity to joints or long bones Pelvis stable with no shortening or rotation of LE bilaterally  Skin:    General: Skin is warm and dry.     Capillary Refill: Capillary refill takes less than 2 seconds.  Neurological:     General: No focal deficit present.     Mental Status: She is alert and oriented to person, place, and time. Mental status is at baseline.     GCS: GCS eye subscore is 4. GCS verbal subscore is 5. GCS motor subscore is 6.     Cranial Nerves: Cranial nerves 2-12 are intact. No cranial nerve  deficit.     Sensory: Sensation is intact. No sensory deficit.     Motor: Motor function is intact. No weakness or tremor.     Coordination: Coordination is intact. Finger-Nose-Finger Test and Heel to [place] Test normal.     Gait: Gait is intact. Gait normal.     Deep Tendon Reflexes: Reflexes are normal and symmetric.     Comments: Acting following commands appropriately     ED Results / Procedures / Treatments   Labs (all labs ordered are listed, but only abnormal results are displayed) Labs Reviewed - No data to display  EKG None  Radiology DG Hand Complete Right  Result Date: 05/04/2023 CLINICAL DATA:  Dog bite. EXAM: RIGHT HAND - COMPLETE 3+ VIEW COMPARISON:  None Available. FINDINGS: Soft tissue gas is identified overlying the fourth middle and proximal phalanges. There is also gas seen tracking along the subcutaneous soft tissues of the dorsum of the hand. No underlying fracture or dislocation identified. No foreign bodies. IMPRESSION: 1. Soft tissue gas overlying the fourth middle and proximal phalanges and extending along the subcutaneous soft tissues of the dorsum of the hand. 2. No fracture or foreign body. Electronically Signed   By: Signa Kell M.D.   On: 05/04/2023 16:02    Procedures .Marland KitchenLaceration Repair  Date/Time: 05/04/2023 6:30 PM  Performed by: Judithann Sheen, PA Authorized by: Judithann Sheen, PA   Consent:    Consent obtained:  Verbal   Consent given by:  Patient   Risks discussed:  Infection, pain and poor cosmetic result   Alternatives discussed:  No treatment Universal protocol:    Procedure explained and questions answered to patient or proxy's satisfaction: yes     Patient identity confirmed:  Verbally with patient and arm band Anesthesia:    Anesthesia method:  Local infiltration   Local anesthetic:  Lidocaine 1% w/o epi Laceration details:    Location:  Hand   Hand location:  L hand, dorsum   Length (cm):  4 Pre-procedure details:     Preparation:  Patient was prepped and draped in usual sterile fashion Exploration:    Imaging obtained: x-ray     Imaging outcome: foreign body not noted     Wound extent: no tendon damage and no underlying fracture   Treatment:    Area cleansed with:  Saline   Amount of cleaning:  Extensive   Irrigation solution:  Sterile saline   Irrigation volume:  300   Irrigation method:  Pressure wash Skin repair:    Repair method:  Sutures  Suture size:  5-0   Suture material:  Prolene   Suture technique:  Simple interrupted   Number of sutures:  1 Approximation:    Approximation:  Loose Repair type:    Repair type:  Simple Post-procedure details:    Dressing:  Open (no dressing)   Procedure completion:  Tolerated well, no immediate complications     Medications Ordered in ED Medications  lidocaine (PF) (XYLOCAINE) 1 % injection 2 mL (has no administration in time range)  ketorolac (TORADOL) 30 MG/ML injection (  Not Given 05/04/23 1748)  Tdap (BOOSTRIX) injection 0.5 mL (has no administration in time range)  oxyCODONE-acetaminophen (PERCOCET/ROXICET) 5-325 MG per tablet 1 tablet (1 tablet Oral Given 05/04/23 1505)  lidocaine-EPINEPHrine-tetracaine (LET) topical gel (3 mLs Topical Given by Other 05/04/23 1700)  amoxicillin-clavulanate (AUGMENTIN) 875-125 MG per tablet 1 tablet (1 tablet Oral Given 05/04/23 1742)  ondansetron (ZOFRAN-ODT) disintegrating tablet 4 mg (4 mg Oral Given 05/04/23 1742)  ketorolac (TORADOL) 30 MG/ML injection 30 mg (30 mg Intramuscular Given 05/04/23 1744)    ED Course/ Medical Decision Making/ A&P                                 Medical Decision Making    Patient presents to the ED for concern of injury following dog bite, this involves an extensive number of treatment options, and is a complaint that carries with it a high risk of complications and morbidity.   Co morbidities that complicate the patient evaluation  None   Additional history  obtained:  Additional history obtained from  Family and Nursing   External records from outside source obtained and reviewed   Imaging Studies ordered:  I ordered imaging studies including XR of left hand  I independently visualized and interpreted imaging which showed no acute fx I agree with the radiologist interpretation    Medicines ordered and prescription drug management:  I ordered medication including Percocet and Toradol for pain management   Reevaluation of the patient after these medicines showed that the patient improved I have reviewed the patients home medicines and have made adjustments as needed    Problem List / ED Course:  Animal bite   Reevaluation:  After the interventions noted above, I reevaluated the patient and found that they have :improved    Dispostion:  Patient is resting comfortably in bed.  Vital signs WNL.  See HPI.  Upon evaluation, Patient has avulsion to dorsal right hand and laceration to ventral fourth digit. Hemorrhage is controlled with direct pressure with no arterial bleed noted. patient is able to fully flex and extend digits at MCP, PPIP, and DIP.  FDS, FDP intact. Medial, ulnar, and radial nerve intact. There is mild paresthesia to fourth finger likely due to superficial nerve damage and mild swelling from penetrating dog bite.  Husband at bedside provides paperwork regarding adequate and in date rabies vaccination of the dog that bit her hand.  Tdap administered for tetanus prophylaxis.  XR is not significant for acute fracture.  Provided Percocet and Toradol for pain management.  Zofran for nausea.  Wound is well irrigated with saline with 1 stitch placed on dorsal hand.  Wound remains open to allow for drainage and infection prophylaxis.  1 dose of Augmentin is given in ER.  After consideration of the diagnostic results and the patients response to treatment, I feel that the patent would benefit from outpatient management with  pain control and prescription for augmentin for infection prophylaxis.  Discussed findings, imaging, disposition, return to emergency department precautions with patient expresses understanding and agrees with plan.  Emphasized importance of ensuring wound is not submerged in dirty water.  Return to emergency department precautions include but not limited to inability to fully flex or extend finger, fusiform swelling, tenderness along tendon sheath or in palm, pain with passive extension.  Dr. Andria Meuse individually assessed patient and agrees with plan.       Final Clinical Impression(s) / ED Diagnoses Final diagnoses:  Dog bite, initial encounter    Rx / DC Orders ED Discharge Orders          Ordered    amoxicillin-clavulanate (AUGMENTIN) 875-125 MG tablet  Every 12 hours        05/04/23 1836              Judithann Sheen, PA 05/04/23 1836    Anders Simmonds T, DO 05/06/23 2346

## 2023-05-06 ENCOUNTER — Ambulatory Visit: Payer: Self-pay

## 2023-05-06 NOTE — Telephone Encounter (Signed)
Chief Complaint: Suture removal question  Symptoms: swelling at site,  Frequency: constant  Pertinent Negatives: Patient denies fever, redness around suture site, uncontrolled pain  Disposition: [] ED /[x] Urgent Care (no appt availability in office) / [] Appointment(In office/virtual)/ []  [place] [facility]/ [] Home Care/ [] Refused Recommended Disposition /[] [facility]/ []  Follow-up with PCP Additional Notes: Patient stated she was bite by a dog on Sunday and went to the ED. She has a suture on the top of the right hand that will need to be removed in 7-10 days. Patient does not have a PCP or insurance at this time. Care advice given including ice, elevation, and over the counter pain medication instructions. Urgent care resources was given for suture removal and mobile bus clinic resources given if signs and symptoms of infection occur. Patient verbalized understanding.  Summary: Pt stated that she needs to have stitches removed due to a dog bite.   Pt called in to [facility]. Pt does not have a pcp. Pt stated that she needs to have stitches removed due to a dog bite. There were no appts within the next 2 weeks so pt requests that a nurse return her call. Pt reports that she was told that most urgent care locations charge $250 to remove stitches. Cb# [phone]     Reason for Disposition  Suture (or staple) removal date, questions about  Answer Assessment - Initial Assessment Questions 1. LOCATION: "Where are the sutures (or staples) located?"      The top of the right hand 2. NUMBER: "How many sutures (or staples) are there?"      1 suture 3. DATE IN: "When were the sutures (or staples) put in?"       Sunday 05/04/23 4. DATE OUT: "When did your doctor tell you the sutures (or staples) needed to come out?"     7-10 days  5. OTHER SYMPTOMS: "Do you have any other symptoms?" (e.g., wound pain, discharge, fever?)     None  Protocols used: Suture or Staple Questions-A-AH

## 2023-08-11 ENCOUNTER — Ambulatory Visit
Admission: EM | Admit: 2023-08-11 | Discharge: 2023-08-11 | Disposition: A | Discharge status: Home / Self Care | Payer: Medicaid Other | Attending: Emergency Medicine | Admitting: Emergency Medicine

## 2023-08-11 DIAGNOSIS — J0100 Acute maxillary sinusitis, unspecified: Secondary | ICD-10-CM | POA: Diagnosis not present

## 2023-08-11 DIAGNOSIS — H6691 Otitis media, unspecified, right ear: Secondary | ICD-10-CM

## 2023-08-11 MED ORDER — AMOXICILLIN-POT CLAVULANATE 875-125 MG PO TABS: 1.0000 | ORAL_TABLET | Freq: Two times a day (BID) | ORAL | 0 refills | Status: DC

## 2023-08-11 NOTE — ED Triage Notes (Signed)
Patient to Urgent Care with complaints of bilateral ear pain (worse in the right side).   Symptoms started approx 3 days ago but has had some URI symptoms/ congestion for approx 2 weeks. Denies any known recent fevers but has had some chills.  No otc meds today. Has been taking tylenol/ motrin/ cough meds.

## 2023-08-11 NOTE — Discharge Instructions (Addendum)
 Take the Augmentin as directed.  Follow up with your primary care provider if your symptoms are not improving.

## 2023-08-11 NOTE — ED Provider Notes (Signed)
Renaldo Fiddler    CSN: [id number] Arrival date & time: 08/11/23  1056      History   Chief Complaint Chief Complaint  Patient presents with   Otalgia    HPI Adrienne West is a 33 y.o. female.  Patient presents with 3-day history of ear pain, worse on the right.  She reports sinus congestion, sinus pressure, runny nose, postnasal drip, mild cough x 2 weeks.  No OTC medications taken today.  No fever or shortness of breath.  The history is provided by the patient and medical records.    Past Medical History:  Diagnosis Date   Anemia    Anxiety    Bipolar disorder (HCC)    GERD (gastroesophageal reflux disease)    occ   Headache    migraines    Patient Active Problem List   Diagnosis Date Noted   Mild intermittent asthma 04/29/2022   Bipolar 2 disorder (HCC) 09/26/2020   Tobacco use disorder 09/26/2020   Gastroesophageal reflux disease without esophagitis 09/26/2020   Family history of breast cancer in mother 09/26/2020   Menometrorrhagia 04/29/2018    Past Surgical History:  Procedure Laterality Date   APPENDECTOMY     aspiration of breast cyst Right 2013   CESAREAN SECTION N/A 10/02/2017   Procedure: CESAREAN SECTION;  Surgeon: Conard Novak, MD;  Location: [facility];  Service: Obstetrics;  Laterality: N/A;   LAPAROSCOPIC APPENDECTOMY N/A 2012   LAPAROSCOPIC BILATERAL SALPINGECTOMY N/A 11/18/2017   Procedure: LAPAROSCOPIC PARTIAL SALPINGECTOMY;  Surgeon: Nadara Mustard, MD;  Location: [facility];  Service: Gynecology;  Laterality: N/A;   TUBAL LIGATION     WISDOM TOOTH EXTRACTION      OB History     Gravida  3   Para  3   Term  2   Preterm  1   AB  0   Living  3      SAB  0   IAB  0   Ectopic  0   Multiple  0   Live Births  3            Home Medications    Prior to Admission medications   Medication Sig Start Date End Date Taking? Authorizing Provider  amoxicillin-clavulanate (AUGMENTIN) 875-125 MG tablet Take 1  tablet by mouth every 12 (twelve) hours. 08/11/23  Yes Mickie Bail, NP  acetaminophen (TYLENOL) 500 MG tablet Take 1,000 mg by mouth daily as needed for moderate pain (pain score 4-6), headache or fever.    [provider]    Family History Family History  Problem Relation Age of Onset   Bipolar disorder Mother    Endometriosis Mother    Breast cancer Mother 76   Anxiety disorder Mother    Depression Mother    Stroke Father    Hyperlipidemia Father    Hypertension Father    Bipolar disorder Sister    Anxiety disorder Sister    Depression Sister    COPD Maternal Grandmother    Hypothyroidism Maternal Grandmother    Diabetes Maternal Grandfather    Other Maternal Grandfather        Hepatic Encepholapathy   Rheum arthritis Paternal Grandmother    Liver disease Paternal Grandmother    AAA (abdominal aortic aneurysm) Paternal Grandfather    Stroke Paternal Grandfather    Vascular Disease Paternal Grandfather    Breast cancer Maternal Great-grandmother        28s    Social History Social  History   Tobacco Use   Smoking status: Every Day    Current packs/day: 1.00    Average packs/day: 1 pack/day for 12.0 years (12.0 ttl pk-yrs)    Types: Cigarettes   Smokeless tobacco: Never  Vaping Use   Vaping status: Never Used  Substance Use Topics   Alcohol use: Yes    Comment: very rare   Drug use: No     Allergies   Hydrocodone and Tegaderm ag mesh [silver]   Review of Systems Review of Systems  Constitutional:  Negative for chills and fever.  HENT:  Positive for congestion, ear pain, postnasal drip, rhinorrhea and sinus pressure. Negative for sore throat.   Respiratory:  Positive for cough. Negative for shortness of breath.      Physical Exam Triage Vital Signs ED Triage Vitals  Encounter Vitals Group     BP 08/11/23 1209 127/76     Systolic BP Percentile --      Diastolic BP Percentile --      Pulse Rate 08/11/23 1209 80     Resp 08/11/23 1209 18      Temp 08/11/23 1209 99 F (37.2 C)     Temp src --      SpO2 08/11/23 1209 97 %     Weight --      Height --      Head Circumference --      Peak Flow --      Pain Score 08/11/23 1215 4     Pain Loc --      Pain Education --      Exclude from Growth Chart --    No data found.  Updated Vital Signs BP 127/76   Pulse 80   Temp 99 F (37.2 C)   Resp 18   LMP 08/09/2023   SpO2 97%   Visual Acuity Right Eye Distance:   Left Eye Distance:   Bilateral Distance:    Right Eye Near:   Left Eye Near:    Bilateral Near:     Physical Exam Constitutional:      General: She is not in acute distress. HENT:     Right Ear: Tympanic membrane is erythematous and bulging.     Left Ear: Tympanic membrane is bulging.     Nose: Congestion and rhinorrhea present.     Mouth/Throat:     Mouth: Mucous membranes are moist.     Pharynx: Oropharynx is clear.  Cardiovascular:     Rate and Rhythm: Normal rate and regular rhythm.     Heart sounds: Normal heart sounds.  Pulmonary:     Effort: Pulmonary effort is normal. No respiratory distress.     Breath sounds: Normal breath sounds.  Neurological:     Mental Status: She is alert.      UC Treatments / Results  Labs (all labs ordered are listed, but only abnormal results are displayed) Labs Reviewed - No data to display  EKG   Radiology No results found.  Procedures Procedures (including critical care time)  Medications Ordered in UC Medications - No data to display  Initial Impression / Assessment and Plan / UC Course  I have reviewed the triage vital signs and the nursing notes.  Pertinent labs & imaging results that were available during my care of the patient were reviewed by me and considered in my medical decision making (see chart for details).    Right otitis media, acute sinusitis.  Afebrile and vital signs are stable.  Lungs are clear and O2 sat is 97%.  Treating today with Augmentin.  Tylenol or ibuprofen as  needed.  Mucinex as needed.  Education provided on otitis media and sinusitis.  Instructed patient to follow up with her PCP if her symptoms are not improving.  She agrees to plan of care.   Final Clinical Impressions(s) / UC Diagnoses   Final diagnoses:  Right otitis media, unspecified otitis media type  Acute non-recurrent maxillary sinusitis     Discharge Instructions      Take the Augmentin as directed.  Follow-up with your primary care provider if your symptoms are not improving.      ED Prescriptions     Medication Sig Dispense Auth. Provider   amoxicillin-clavulanate (AUGMENTIN) 875-125 MG tablet Take 1 tablet by mouth every 12 (twelve) hours. 14 tablet Mickie Bail, NP      PDMP not reviewed this encounter.   Mickie Bail, NP 08/11/23 1255

## 2023-12-19 ENCOUNTER — Ambulatory Visit
Admission: EM | Admit: 2023-12-19 | Discharge: 2023-12-19 | Disposition: A | Discharge status: Home / Self Care | Payer: No Typology Code available for payment source

## 2023-12-19 DIAGNOSIS — R09A9 Foreign body sensation, other site: Secondary | ICD-10-CM

## 2023-12-19 NOTE — ED Triage Notes (Signed)
 Pt c/o foreign object in right ear x4days  Pt states that it feels like a bug is in her ear.

## 2023-12-19 NOTE — ED Provider Notes (Signed)
 [facility]    CSN: [id number] Arrival date & time: 12/19/23  1823      History   Chief Complaint Chief Complaint  Patient presents with   Foreign Body in Ear         HPI Adrienne West is a 33 y.o. female.   33 year old female, Adrienne West, presents to urgent care for evaluation of concern of foreign body in right ear x 4 days.  Patient states it feels like she has a bug in her right ear.  Patient denies trauma.  The history is provided by the patient. No language interpreter was used.    Past Medical History:  Diagnosis Date   Anemia    Anxiety    Bipolar disorder (HCC)    GERD (gastroesophageal reflux disease)    occ   Headache    migraines    Patient Active Problem List   Diagnosis Date Noted   Foreign body sensation in ear canal 12/19/2023   Mild intermittent asthma 04/29/2022   Bipolar 2 disorder (HCC) 09/26/2020   Tobacco use disorder 09/26/2020   Gastroesophageal reflux disease without esophagitis 09/26/2020   Family history of breast cancer in mother 09/26/2020   Menometrorrhagia 04/29/2018    Past Surgical History:  Procedure Laterality Date   APPENDECTOMY     aspiration of breast cyst Right 2013   CESAREAN SECTION N/A 10/02/2017   Procedure: CESAREAN SECTION;  Surgeon: Kris Pester, MD;  Location: [facility];  Service: Obstetrics;  Laterality: N/A;   LAPAROSCOPIC APPENDECTOMY N/A 2012   LAPAROSCOPIC BILATERAL SALPINGECTOMY N/A 11/18/2017   Procedure: LAPAROSCOPIC PARTIAL SALPINGECTOMY;  Surgeon: Alben Alma, MD;  Location: [facility];  Service: Gynecology;  Laterality: N/A;   TUBAL LIGATION     WISDOM TOOTH EXTRACTION      OB History     Gravida  3   Para  3   Term  2   Preterm  1   AB  0   Living  3      SAB  0   IAB  0   Ectopic  0   Multiple  0   Live Births  3            Home Medications    Prior to Admission medications   Medication Sig Start Date End Date Taking? Authorizing Provider   acetaminophen  (TYLENOL ) 500 MG tablet Take 1,000 mg by mouth daily as needed for moderate pain (pain score 4-6), headache or fever.   Yes [provider]  amoxicillin -clavulanate (AUGMENTIN ) 875-125 MG tablet Take 1 tablet by mouth every 12 (twelve) hours. 08/11/23   Wellington Half, NP    Family History Family History  Problem Relation Age of Onset   Bipolar disorder Mother    Endometriosis Mother    Breast cancer Mother 57   Anxiety disorder Mother    Depression Mother    Stroke Father    Hyperlipidemia Father    Hypertension Father    Bipolar disorder Sister    Anxiety disorder Sister    Depression Sister    COPD Maternal Grandmother    Hypothyroidism Maternal Grandmother    Diabetes Maternal Grandfather    Other Maternal Grandfather        Hepatic Encepholapathy   Rheum arthritis Paternal Grandmother    Liver disease Paternal Grandmother    AAA (abdominal aortic aneurysm) Paternal Grandfather    Stroke Paternal Grandfather    Vascular Disease Paternal Grandfather  Breast cancer Maternal Great-grandmother        67s    Social History Social History   Tobacco Use   Smoking status: Every Day    Current packs/day: 1.00    Average packs/day: 1 pack/day for 12.0 years (12.0 ttl pk-yrs)    Types: Cigarettes   Smokeless tobacco: Never  Vaping Use   Vaping status: Never Used  Substance Use Topics   Alcohol use: Yes    Comment: very rare   Drug use: No     Allergies   Hydrocodone and Tegaderm ag mesh [silver]   Review of Systems Review of Systems  HENT:  Positive for ear pain.   All other systems reviewed and are negative.    Physical Exam Triage Vital Signs ED Triage Vitals  Encounter Vitals Group     BP      Girls Systolic BP Percentile      Girls Diastolic BP Percentile      Boys Systolic BP Percentile      Boys Diastolic BP Percentile      Pulse      Resp      Temp      Temp src      SpO2      Weight      Height      Head  Circumference      Peak Flow      Pain Score      Pain Loc      Pain Education      Exclude from Growth Chart    No data found.  Updated Vital Signs BP 116/87 (BP Location: Left Arm)   Pulse (!) 56   Temp 98.7 F (37.1 C) (Oral)   Ht 5' 2 (1.575 m)   Wt 120 lb (54.4 kg)   LMP 12/13/2023   SpO2 99%   BMI 21.95 kg/m   Visual Acuity Right Eye Distance:   Left Eye Distance:   Bilateral Distance:    Right Eye Near:   Left Eye Near:    Bilateral Near:     Physical Exam Vitals and nursing note reviewed.  HENT:     Right Ear: Tympanic membrane normal. No foreign body.     Left Ear: Tympanic membrane normal. No foreign body.     Nose: Nose normal.     Mouth/Throat:     Lips: Pink.     Mouth: Mucous membranes are moist.     Pharynx: Oropharynx is clear. Uvula midline.   Cardiovascular:     Rate and Rhythm: Normal rate.  Pulmonary:     Effort: Pulmonary effort is normal.   Neurological:     General: No focal deficit present.     Mental Status: She is alert and oriented to person, place, and time.      UC Treatments / Results  Labs (all labs ordered are listed, but only abnormal results are displayed) Labs Reviewed - No data to display  EKG   Radiology No results found.  Procedures Procedures (including critical care time)  Medications Ordered in UC Medications - No data to display  Initial Impression / Assessment and Plan / UC Course  I have reviewed the triage vital signs and the nursing notes.  Pertinent labs & imaging results that were available during my care of the patient were reviewed by me and considered in my medical decision making (see chart for details).    Discussed exam findings (no foreign body ) and plan  of care with patient,  patient verbalized understanding to this provider  Ddx: Foreign body sensation, viral illness, allergies, anxiety Final Clinical Impressions(s) / UC Diagnoses   Final diagnoses:  Foreign body sensation in  ear canal     Discharge Instructions      No foreign body is noted in your right ear May take over-the-counter meds for symptom management/pain(tylenol /ibuprofen ) Do not stick anything in your ears Follow-up with PCP Return as needed     ED Prescriptions   None    PDMP not reviewed this encounter.   Peter Brands, NP 12/19/23 1929

## 2023-12-19 NOTE — Discharge Instructions (Addendum)
 No foreign body is noted in your right ear May take over-the-counter meds for symptom management/pain(tylenol /ibuprofen ) Do not stick anything in your ears Follow-up with PCP Return as needed

## 2024-02-03 ENCOUNTER — Other Ambulatory Visit: Payer: Self-pay | Admitting: Medical Genetics

## 2024-03-09 ENCOUNTER — Other Ambulatory Visit: Payer: Self-pay

## 2024-03-09 DIAGNOSIS — Z006 Encounter for examination for normal comparison and control in clinical research program: Secondary | ICD-10-CM

## 2024-03-12 ENCOUNTER — Encounter: Payer: Self-pay | Admitting: Emergency Medicine

## 2024-03-12 ENCOUNTER — Ambulatory Visit
Admission: EM | Admit: 2024-03-12 | Discharge: 2024-03-12 | Disposition: A | Discharge status: Home / Self Care | Payer: Self-pay | Attending: Emergency Medicine | Admitting: Emergency Medicine

## 2024-03-12 DIAGNOSIS — J069 Acute upper respiratory infection, unspecified: Secondary | ICD-10-CM | POA: Insufficient documentation

## 2024-03-12 LAB — GROUP A STREP BY PCR: Group A Strep by PCR: NOT DETECTED

## 2024-03-12 LAB — SARS CORONAVIRUS 2 BY RT PCR: SARS Coronavirus 2 by RT PCR: NEGATIVE

## 2024-03-12 MED ORDER — IPRATROPIUM BROMIDE 0.06 % NA SOLN: 2.0000 | Freq: Four times a day (QID) | NASAL | 12 refills | Status: AC

## 2024-03-12 MED ORDER — BENZONATATE 100 MG PO CAPS: 200.0000 mg | ORAL_CAPSULE | Freq: Three times a day (TID) | ORAL | 0 refills | Status: AC

## 2024-03-12 MED ORDER — PROMETHAZINE-DM 6.25-15 MG/5ML PO SYRP: 5.0000 mL | ORAL_SOLUTION | Freq: Four times a day (QID) | ORAL | 0 refills | Status: AC | PRN

## 2024-03-12 NOTE — ED Provider Notes (Signed)
 [facility]    CSN: [id number] Arrival date & time: 03/12/24  1109      History   Chief Complaint Chief Complaint  Patient presents with   Sore Throat    HPI Adrienne West is a 33 y.o. female.   HPI  20 female with past medical history significant for migraine headaches, GERD, bipolar disorder, anxiety, anemia presents for evaluation of respiratory symptoms that have been going on for last 4 days.  Her most prominent complaint is a sore throat.  She reports that all 3 of her children are currently being treated for strep.  She is also had runny nose, nasal congestion, significant postnasal drip, and a cough.  She reports that her cough is productive but she thinks is actually from the postnasal drip versus chest congestion.  She denies any shortness of breath or wheezing.  Past Medical History:  Diagnosis Date   Anemia    Anxiety    Bipolar disorder (HCC)    GERD (gastroesophageal reflux disease)    occ   Headache    migraines    Patient Active Problem List   Diagnosis Date Noted   Foreign body sensation in ear canal 12/19/2023   Mild intermittent asthma 04/29/2022   Bipolar 2 disorder (HCC) 09/26/2020   Tobacco use disorder 09/26/2020   Gastroesophageal reflux disease without esophagitis 09/26/2020   Family history of breast cancer in mother 09/26/2020   Menometrorrhagia 04/29/2018    Past Surgical History:  Procedure Laterality Date   APPENDECTOMY     aspiration of breast cyst Right 2013   CESAREAN SECTION N/A 10/02/2017   Procedure: CESAREAN SECTION;  Surgeon: Leonce Garnette BIRCH, MD;  Location: [facility];  Service: Obstetrics;  Laterality: N/A;   LAPAROSCOPIC APPENDECTOMY N/A 2012   LAPAROSCOPIC BILATERAL SALPINGECTOMY N/A 11/18/2017   Procedure: LAPAROSCOPIC PARTIAL SALPINGECTOMY;  Surgeon: Arloa Lamar SQUIBB, MD;  Location: [facility];  Service: Gynecology;  Laterality: N/A;   TUBAL LIGATION     WISDOM TOOTH EXTRACTION      OB History      Gravida  3   Para  3   Term  2   Preterm  1   AB  0   Living  3      SAB  0   IAB  0   Ectopic  0   Multiple  0   Live Births  3            Home Medications    Prior to Admission medications   Medication Sig Start Date End Date Taking? Authorizing Provider  benzonatate  (TESSALON ) 100 MG capsule Take 2 capsules (200 mg total) by mouth every 8 (eight) hours. 03/12/24  Yes Bernardino Ditch, NP  ipratropium (ATROVENT ) 0.06 % nasal spray Place 2 sprays into both nostrils 4 (four) times daily. 03/12/24  Yes Bernardino Ditch, NP  promethazine -dextromethorphan (PROMETHAZINE -DM) 6.25-15 MG/5ML syrup Take 5 mLs by mouth 4 (four) times daily as needed. 03/12/24  Yes Bernardino Ditch, NP  acetaminophen  (TYLENOL ) 500 MG tablet Take 1,000 mg by mouth daily as needed for moderate pain (pain score 4-6), headache or fever.    [provider]    Family History Family History  Problem Relation Age of Onset   Bipolar disorder Mother    Endometriosis Mother    Breast cancer Mother 36   Anxiety disorder Mother    Depression Mother    Stroke Father    Hyperlipidemia Father    Hypertension Father  Bipolar disorder Sister    Anxiety disorder Sister    Depression Sister    COPD Maternal Grandmother    Hypothyroidism Maternal Grandmother    Diabetes Maternal Grandfather    Other Maternal Grandfather        Hepatic Encepholapathy   Rheum arthritis Paternal Grandmother    Liver disease Paternal Grandmother    AAA (abdominal aortic aneurysm) Paternal Grandfather    Stroke Paternal Grandfather    Vascular Disease Paternal Grandfather    Breast cancer Maternal Great-grandmother        14s    Social History Social History   Tobacco Use   Smoking status: Every Day    Current packs/day: 1.00    Average packs/day: 1 pack/day for 12.0 years (12.0 ttl pk-yrs)    Types: Cigarettes   Smokeless tobacco: Never  Vaping Use   Vaping status: Never Used  Substance Use Topics   Alcohol use:  Yes    Comment: very rare   Drug use: No     Allergies   Hydrocodone and Tegaderm ag mesh [silver]   Review of Systems Review of Systems  Constitutional:  Negative for fever.  HENT:  Positive for congestion, ear pain, postnasal drip, rhinorrhea and sore throat.        Ear pressure  Respiratory:  Positive for cough. Negative for shortness of breath and wheezing.      Physical Exam Triage Vital Signs ED Triage Vitals  Encounter Vitals Group     BP      Girls Systolic BP Percentile      Girls Diastolic BP Percentile      Boys Systolic BP Percentile      Boys Diastolic BP Percentile      Pulse      Resp      Temp      Temp src      SpO2      Weight      Height      Head Circumference      Peak Flow      Pain Score      Pain Loc      Pain Education      Exclude from Growth Chart    No data found.  Updated Vital Signs BP 116/80 (BP Location: Left Arm)   Pulse 70   Temp 98.2 F (36.8 C) (Oral)   Resp 14   Ht 5' 2 (1.575 m)   Wt 119 lb 14.9 oz (54.4 kg)   LMP 02/27/2024 (Approximate)   SpO2 100%   BMI 21.94 kg/m   Visual Acuity Right Eye Distance:   Left Eye Distance:   Bilateral Distance:    Right Eye Near:   Left Eye Near:    Bilateral Near:     Physical Exam Vitals and nursing note reviewed.  Constitutional:      Appearance: Normal appearance. She is not ill-appearing.  HENT:     Head: Normocephalic and atraumatic.     Right Ear: Tympanic membrane, ear canal and external ear normal. There is no impacted cerumen.     Left Ear: Tympanic membrane, ear canal and external ear normal. There is no impacted cerumen.     Nose: Congestion and rhinorrhea present.     Comments: This mucosa is edematous and mildly erythematous with scant clear discharge in both nares.    Mouth/Throat:     Mouth: Mucous membranes are moist.     Pharynx: Oropharynx is clear. Posterior oropharyngeal erythema  present. No oropharyngeal exudate.     Comments: Tonsillar pillars  are unremarkable.  There is erythema to the soft palate.  Posterior oropharynx also demonstrates erythema and injection with clear postnasal drip. Neck:     Comments: Bilateral anterior, tender cervical lymphadenopathy present. Cardiovascular:     Rate and Rhythm: Normal rate and regular rhythm.     Pulses: Normal pulses.     Heart sounds: Normal heart sounds. No murmur heard.    No friction rub. No gallop.  Pulmonary:     Effort: Pulmonary effort is normal.     Breath sounds: Normal breath sounds. No wheezing, rhonchi or rales.  Musculoskeletal:     Cervical back: Normal range of motion and neck supple. Tenderness present.  Lymphadenopathy:     Cervical: Cervical adenopathy present.  Skin:    General: Skin is warm and dry.     Capillary Refill: Capillary refill takes less than 2 seconds.     Findings: No rash.  Neurological:     General: No focal deficit present.     Mental Status: She is alert and oriented to person, place, and time.      UC Treatments / Results  Labs (all labs ordered are listed, but only abnormal results are displayed) Labs Reviewed  GROUP A STREP BY PCR  SARS CORONAVIRUS 2 BY RT PCR    EKG   Radiology No results found.  Procedures Procedures (including critical care time)  Medications Ordered in UC Medications - No data to display  Initial Impression / Assessment and Plan / UC Course  I have reviewed the triage vital signs and the nursing notes.  Pertinent labs & imaging results that were available during my care of the patient were reviewed by me and considered in my medical decision making (see chart for details).   Patient is a pleasant, nontoxic-appearing 33 year old female presenting for evaluation of respiratory symptoms outlined HPI above.  She is concerned that she may have strep because she has shared food and drink with her children and they have all tested positive for strep.  They all have the same symptoms she does.  Her physical  exam does reveal erythema to the soft palate as well as erythema injection to the posterior oropharynx but no visible tonsillar hypertrophy or exudate.  Cervical lymphadenopathy is present.  Differential diagnose include strep pharyngitis, COVID, influenza, viral illness.  Due to the fact that she has had symptoms for 4 days I will not test her for influenza but I will test for COVID as well as order a strep PCR.  Strep PCR is negative.  COVID PCR is negative.  I will discharge patient home with diagnosis of viral URI with a cough.  I will prescribe Atrovent  nasal spray open nasal congestion postnasal drip along with Tessalon  Perles and Promethazine  DM cough syrup to help with cough and congestion.  Return precautions reviewed   Final Clinical Impressions(s) / UC Diagnoses   Final diagnoses:  Viral URI with cough     Discharge Instructions      Your testing today was negative for both strep and COVID.  Your exam is consistent with upper respiratory tract infection and given your duration of symptoms it is most likely viral.  Use over-the-counter Tylenol  and/or ibuprofen  as needed for any pain you may experience for fever if you develop 1.  Use the Atrovent  nasal spray, 2 squirts in each nostril every 6 hours, as needed for runny nose and postnasal drip.  Use the Tessalon  Perles every 8 hours during the day.  Take them with a small sip of water.  They may give you some numbness to the base of your tongue or a metallic taste in your mouth, this is normal.  Use the Promethazine  DM cough syrup at bedtime for cough and congestion.  It will make you drowsy so do not take it during the day.  Return for reevaluation or see your primary care provider for any new or worsening symptoms.      ED Prescriptions     Medication Sig Dispense Auth. Provider   benzonatate  (TESSALON ) 100 MG capsule Take 2 capsules (200 mg total) by mouth every 8 (eight) hours. 21 capsule Bernardino Ditch, NP    ipratropium (ATROVENT ) 0.06 % nasal spray Place 2 sprays into both nostrils 4 (four) times daily. 15 mL Bernardino Ditch, NP   promethazine -dextromethorphan (PROMETHAZINE -DM) 6.25-15 MG/5ML syrup Take 5 mLs by mouth 4 (four) times daily as needed. 118 mL Bernardino Ditch, NP      PDMP not reviewed this encounter.   Bernardino Ditch, NP 03/12/24 1250

## 2024-03-12 NOTE — Discharge Instructions (Addendum)
 Your testing today was negative for both strep and COVID.  Your exam is consistent with upper respiratory tract infection and given your duration of symptoms it is most likely viral.  Use over-the-counter Tylenol  and/or ibuprofen  as needed for any pain you may experience for fever if you develop 1.  Use the Atrovent  nasal spray, 2 squirts in each nostril every 6 hours, as needed for runny nose and postnasal drip.  Use the Tessalon  Perles every 8 hours during the day.  Take them with a small sip of water.  They may give you some numbness to the base of your tongue or a metallic taste in your mouth, this is normal.  Use the Promethazine  DM cough syrup at bedtime for cough and congestion.  It will make you drowsy so do not take it during the day.  Return for reevaluation or see your primary care provider for any new or worsening symptoms.

## 2024-03-12 NOTE — ED Triage Notes (Signed)
 Patient c/o sore throat, nasal congestion, runny nose, and chest congestion that started on Monday.  Patient denies fevers.

## 2024-03-19 LAB — GENECONNECT MOLECULAR SCREEN: Genetic Analysis Overall Interpretation: NEGATIVE

## 2024-07-12 DIAGNOSIS — Q9381 Velo-cardio-facial syndrome: Secondary | ICD-10-CM

## 2024-07-12 NOTE — Telephone Encounter (Signed)
 Spoke with Ms. Pint regarding the results of her daughter's recent genetic testing and the personal health implications for Ms. Ceballos herself.  Ms. 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  This deletion is significantly smaller than the deletion typically associated with 22q11.2 deletion syndrome and does not include the TBX1 gene, which is a critical gene for the condition.  These smaller deletions are referred to as central, or nested, deletions.   Individuals with central 22q11.2 deletions present with variable clinical findings including dysmorphic facial features, developmental delays, neural tube defects, kidney and other genitourinary tract anomalies, as well as psychiatric or behavior problems. Cardiovascular anomalies were observed less frequently (approximately 17%) in individuals with central 22q11.2 deletions as compared to reported frequencies in individuals with the common 22q11.2 deletions.  Ms. Briceno reported that she has wondered if she may have some form of neurodivergence, we discussed that individuals with this deletion do have an increased risk for ADHD and autism spectrum disorder, though formal evaluation is required to make a diagnosis.  Ms. Rorabaugh may consider an echocardiogram, and plans to discuss this option when she establishes care with a new PCP.  No other changes to medical management are recommended based on this result.  We discussed that there is a 50% chance that each of her children, including her current children and any future children, may also inherit the same deletion on chromosome 22. Testing of her current children may be considered and Ms. Cong plans to obtain referrals from their providers for this testing.  Kimberly Molt, MS [facility] Certified Genetic Counselor
